# Patient Record
Sex: Female | Born: 1965 | Hispanic: Yes | Marital: Single | Smoking: Current every day smoker
Health system: Southern US, Community
[De-identification: ages and names within clinical notes are randomized; demographics above are authoritative.]

## PROBLEM LIST (undated history)

## (undated) DIAGNOSIS — M549 Dorsalgia, unspecified: Secondary | ICD-10-CM

## (undated) DIAGNOSIS — K2970 Gastritis, unspecified, without bleeding: Secondary | ICD-10-CM

---

## 2014-02-27 ENCOUNTER — Emergency Department (HOSPITAL_COMMUNITY): Payer: Self-pay

## 2014-02-27 ENCOUNTER — Emergency Department (HOSPITAL_COMMUNITY)
Admission: EM | Admit: 2014-02-27 | Discharge: 2014-02-27 | Disposition: A | Discharge status: Home / Self Care | Payer: Self-pay | Attending: Emergency Medicine | Admitting: Emergency Medicine

## 2014-02-27 ENCOUNTER — Encounter (HOSPITAL_COMMUNITY): Payer: Self-pay | Admitting: Emergency Medicine

## 2014-02-27 DIAGNOSIS — J069 Acute upper respiratory infection, unspecified: Secondary | ICD-10-CM | POA: Insufficient documentation

## 2014-02-27 DIAGNOSIS — R0602 Shortness of breath: Secondary | ICD-10-CM | POA: Insufficient documentation

## 2014-02-27 DIAGNOSIS — F17200 Nicotine dependence, unspecified, uncomplicated: Secondary | ICD-10-CM | POA: Insufficient documentation

## 2014-02-27 DIAGNOSIS — Z8719 Personal history of other diseases of the digestive system: Secondary | ICD-10-CM | POA: Insufficient documentation

## 2014-02-27 DIAGNOSIS — J3489 Other specified disorders of nose and nasal sinuses: Secondary | ICD-10-CM | POA: Insufficient documentation

## 2014-02-27 DIAGNOSIS — M549 Dorsalgia, unspecified: Secondary | ICD-10-CM | POA: Insufficient documentation

## 2014-02-27 HISTORY — DX: Gastritis, unspecified, without bleeding: K29.70

## 2014-02-27 HISTORY — DX: Dorsalgia, unspecified: M54.9

## 2014-02-27 LAB — I-STAT TROPONIN, ED: Troponin i, poc: 0.01 ng/mL (ref 0.00–0.08)

## 2014-02-27 LAB — BASIC METABOLIC PANEL
Anion gap: 15 (ref 5–15)
BUN: 15 mg/dL (ref 6–23)
CALCIUM: 9.2 mg/dL (ref 8.4–10.5)
CO2: 23 mEq/L (ref 19–32)
CREATININE: 0.79 mg/dL (ref 0.50–1.10)
Chloride: 105 mEq/L (ref 96–112)
GFR calc Af Amer: >90 mL/min (ref >90)
GFR calc non Af Amer: >90 mL/min (ref >90)
GLUCOSE: 98 mg/dL (ref 70–99)
Potassium: 3.9 mEq/L (ref 3.7–5.3)
Sodium: 143 mEq/L (ref 137–147)

## 2014-02-27 LAB — CBC WITH DIFFERENTIAL/PLATELET
BASOS ABS: 0.1 10*3/uL (ref 0.0–0.1)
Basophils Relative: 0 % (ref 0–1)
Eosinophils Absolute: 0.2 10*3/uL (ref 0.0–0.7)
Eosinophils Relative: 1 % (ref 0–5)
HCT: 36.9 % (ref 36.0–46.0)
Hemoglobin: 12.3 g/dL (ref 12.0–15.0)
LYMPHS PCT: 26 % (ref 12–46)
Lymphs Abs: 3.1 10*3/uL (ref 0.7–4.0)
MCH: 30 pg (ref 26.0–34.0)
MCHC: 33.3 g/dL (ref 30.0–36.0)
MCV: 90 fL (ref 78.0–100.0)
MONO ABS: 0.6 10*3/uL (ref 0.1–1.0)
Monocytes Relative: 5 % (ref 3–12)
Neutro Abs: 8.1 10*3/uL — ABNORMAL HIGH (ref 1.7–7.7)
Neutrophils Relative %: 68 % (ref 43–77)
Platelets: 293 10*3/uL (ref 150–400)
RBC: 4.1 MIL/uL (ref 3.87–5.11)
RDW: 13.3 % (ref 11.5–15.5)
WBC: 12 10*3/uL — AB (ref 4.0–10.5)

## 2014-02-27 MED ORDER — IOHEXOL 300 MG/ML  SOLN
80.0000 mL | Freq: Once | INTRAMUSCULAR | Status: AC | PRN
  Administered 2014-02-27: 80 mL via INTRAVENOUS

## 2014-02-27 MED ORDER — OXYCODONE-ACETAMINOPHEN 5-325 MG PO TABS
1.0000 | ORAL_TABLET | Freq: Once | ORAL | Status: AC
  Administered 2014-02-27: 1 via ORAL
  Filled 2014-02-27: qty 1

## 2014-02-27 MED ORDER — FEXOFENADINE HCL 60 MG PO TABS: 60.0000 mg | ORAL_TABLET | Freq: Two times a day (BID) | ORAL | Status: AC

## 2014-02-27 MED ORDER — ALBUTEROL SULFATE (2.5 MG/3ML) 0.083% IN NEBU
5.0000 mg | INHALATION_SOLUTION | Freq: Once | RESPIRATORY_TRACT | Status: AC
  Administered 2014-02-27: 5 mg via RESPIRATORY_TRACT
  Filled 2014-02-27: qty 6

## 2014-02-27 MED ORDER — PREDNISONE 20 MG PO TABS
60.0000 mg | ORAL_TABLET | Freq: Once | ORAL | Status: AC
  Administered 2014-02-27: 60 mg via ORAL
  Filled 2014-02-27: qty 3

## 2014-02-27 MED ORDER — ALBUTEROL SULFATE HFA 108 (90 BASE) MCG/ACT IN AERS
1.0000 | INHALATION_SPRAY | Freq: Four times a day (QID) | RESPIRATORY_TRACT | Status: DC | PRN
  Administered 2014-02-27: 1 via RESPIRATORY_TRACT
  Filled 2014-02-27: qty 6.7

## 2014-02-27 NOTE — ED Notes (Signed)
Interpreter used to explain AVS in detail. Patient knows to follow up for CT or X-Ray of chest in 3-6 months. Educated on inhaler use. No other questions/concerns. RR even/unlabored.

## 2014-02-27 NOTE — ED Notes (Signed)
Patient transported to X-ray 

## 2014-02-27 NOTE — ED Notes (Signed)
Attempting to contact interpreter to discharge patient.

## 2014-02-27 NOTE — ED Notes (Signed)
MD at bedside. 

## 2014-02-27 NOTE — Discharge Instructions (Signed)
Infeccin de las vas areas superiores en los adultos (Upper Respiratory Infection, Adult)  La infeccin respiratoria de las vas areas superiores se conoce tambin como resfro comn. Las vas areas superiores [employer] senos nasales, la garganta, la trquea, y los bronquios. Los bronquios son las vas areas que conducen el aire a los pulmones. La mayor parte de las personas mejora luego de una [place], Armed forces training and education officer los sntomas pueden durar [employer]. La tos residual puede durar ms. CAUSAS Varios tipos de virus pueden causar la infeccin de los tejidos que cubren las vas areas superiores. Los tejidos se irritan y se inflaman y se originan secreciones. Tambin es frecuente la produccin de moco. El resfro es contagioso. El virus se disemina fcilmente a otras personas por contacto oral. Aqu se incluyen los besos, el compartir un vaso y el toser o Brewing technologist. Tambin puede diseminarse tocndose la boca o la [place] y luego tocando una superficie que luego tocan Producer, television/film/video.  SNTOMAS Los sntomas se desarrollan entre uno y tres das luego de Dietitian en contacto con el virus. Pueden variar de Ardelia Mems persona a otra. Incluyen:  Secrecin nasal.  Estornudos  Congestin nasal.  Irritacin de los senos nasales.  Dolor de Investment banker, operational.  Prdida de la voz (laringitis).  Tos.  Fatiga.  Dolores musculares.  Prdida del apetito.  Dolor de [place].  Fiebre no muy elevada. DIAGNSTICO Puede diagnosticarse a s mismo la infeccin respiratoria, segn los sntomas habituales, ya que la mayor parte de las personas se resfra dos o tres veces al ao. El profesional puede confirmarlo basndose en el examen fsico. Lo ms importante es que el profesional verifique que los sntomas no se deben a otra enfermedad como anginas, sinusitis, neumona, asma o epiglotitis. Para diagnosticar el resfrio comn, no es necesario que haga anlisis de [place], pruebas en la garganta o radiografas, pero en algunos  casos puede ser de utilidad para excluir otros problemas ms graves. El mdico decidir si necesita otras pruebas. RIESGOS Y COMPLICACIONES Tendr mayor riesgo de sufrir un resfro grave si consume cigarrillos, sufre una enfermedad cardaca (como insuficiencia cardaca) o pulmonar crnica (como asma) o si tiene un debilitamiento del sistema inmunolgico. Las personas muy jvenes o muy mayores tienen riesgo de sufrir infecciones ms graves. La sinusitis bacteriana, las infecciones del odo medio y la neumona bacteriana pueden complicar el resfro comn. El resfro puede exacerbar el asma y la enfermedad pulmonar obstructiva crnica. En algunos casos estas complicaciones requieren la atencin en un servicio de emergencias y pueden poner en peligro la vida. PREVENCIN La mejor manera de protegerse para no contraer un resfro es Theatre manager una buena higiene. Evite el contacto bucal o de las manos con personas con sntomas de resfro. Si se produce el contacto, lvese las manos con frecuencia. No hay pruebas firmes que indiquen que la vitamina C, la vitamina E, la equincea o la actividad fsica reduzcan las posibilidades de tener una infeccin. Sin embargo, siempre se recomienda Scientific laboratory technician y Lucilla Edin buena nutricin. TRATAMIENTO El tratamiento est dirigido a Herbalist sntomas. Esta enfermedad no tiene [place]. Los antibiticos no son eficaces, ya que esta infeccin la causa un virus y no una bacteria. El tratamiento incluye:  Aumente la ingesta de lquidos. Consumo de bebidas deportivas, que proporcionan electrolitos,azcares e hidratacin.  Inhale vapor caliente (de un vaporizador o de la ducha).  Tomar sopa de pollo u otros lquidos claros, y [employer] buena nutricin.  Descanse lo suficiente.  Haga grgaras o coma pastillas para Public house manager  las molestias.  Control de la fiebre con ibuprofeno o acetaminofen, segn las indicaciones del mdico.  Aumento del uso del inhalador, si sufre asma. Las  pastillas y los geles de zinc durante las primeras 24 horas de iniciado el resfro comn, pueden disminuir la duracin y Paramedic la gravedad de los sntomas. Los medicamentos para Chief Technology Officer pueden disminuir la fiebre, Paramedic los dolores musculares y Chief Technology Officer de Advertising copywriter. Se dispone de una gran variedad de medicamentos de venta libre para tratar la congestin y la secrecin nasal. El profesional podr recomendarle inhalantes para los otros sntomas. INSTRUCCIONES PARA EL CUIDADO DOMICILIARIO  Utilice los medicamentos de venta libre o de prescripcin para Chief Technology Officer, el malestar o la [place], segn se lo indique el profesional que lo asiste.  Utilice un vaporizador caliente o inhale vapor, haciendo salir agua de la ducha para aumentar la humedad [place]. Esto mantendr las secreciones hmedas y Community education officer ms fcil respirar.  Beba gran cantidad de lquido para mantener la orina de tono claro o color amarillo plido.  Descanse todo lo que pueda.  Regrese a su trabajo cuando la temperatura se haya normalizado, o cuando el profesional que lo asiste se lo indique. Quizs sea necesario que permanezca en su casa durante un tiempo ms prolongado para Buyer, retail a Economist. Tambin puede utilizar un barbijo y ser cuidadoso con el lavado de manos para evitar la diseminacin del virus. SOLICITE ATENCIN MDICA SI:  Luego de los primeros das siente que empeora en vez de [place].  Necesita que Occupational psychologist brinde ms informacin relacionada con los medicamentos para [employer] sntomas.  Siente escalofros, le falta el aire o escupe moco de color marrn o rojo. Estos pueden ser sntomas de neumona.  Tiene una secrecin nasal de color amarillo o marrn, o siente dolor en el rostro, especialmente cuando se inclina hacia adelante. Estos pueden ser sntomas de sinusitis.  Tiene fiebre, siente el cuello hinchado, tiene dolor al tragar u observa manchas blancas en el fondo de la garganta.  Estos pueden ser sntomas de angina por estreptococo. Romona Curls ATENCIN MDICA DE INMEDIATO SI:  Lance Muss.  Comienza a sentir Herbalist de cabeza intenso o persistente, dolor de odos, en el seno nasal o en el pecho.  Tiene tos y esta se prolonga demasiado, tose y escupe sangre, la mucosidad habitual se modifica (si tiene una enfermedad pulmonar crnica) o respira con dificultad.  Siente rigidez en el cuello o dolor de cabeza intenso. Document Released: 03/26/2005 Document Revised: 09/08/2011 [facility] Patient Information 2015 [place], [place]. This information is not intended to replace advice given to you by your health care provider. Make sure you discuss any questions you have with your health care provider.       Resource Guide (Spanish)   Problemas Dentales  Pacientes con Medicaid    :                                                               [facility]   [employer] [phone]                          [phone] [address] [place], [place]  [place], [place]                                             Phone:  [phone]               Phone:  [phone] Si usted no puede pagar o no tiene Water engineer con: Health Serve o el departamento de salud de [place] para que le autoricen el uso de la Therapist, art para adultos.  Problemas con Dolor Crnico Pngase en contacto con la clinica de dolores cronicos en el hospital de [place], [phone].   Los pacientes deben ser [employer] por su medico de cabecera.  Si usted no tiene dinero para pagar sus medicinas Pngase en contacto con United Way:  llame al "211" o Insurance underwriter.   Phone: [phone]  Si usted no tiene mdico de [employer]      Phone: [phone]. Otras agencias que ofrecen cuidado mdico barato son:      Medicina de [place]  [phone]      [facility]  [phone]      Health Serve  Ministry  [phone]      [facility]  [phone]      Planned Parenthood  [phone]      [facility]  [phone]  [facility]  [phone] [facility]  [phone] [facility]  [phone]  (servicios de emergencia [phone])  Abuse/Neglect [facility] Child Abuse Hotline [phone] [facility] Child Abuse Hotline 5856757630 (After Hours)  Emergency Shelter [facility] [phone]  Maternity Homes [facility] at the [facility] [phone] [facility] [phone]  MRSA Hotline #:   (954)725-7771  Servicios de [facility]    United Way           [facility] [address]                                    [address]       [address]                                         Cristobal Goldmann Phone:  [phone]                            Phone: [phone]                Phone: [phone]  [facility] Phone:  [phone]  [facility] Child Abuse Hotline [phone] (After Hours)

## 2014-02-27 NOTE — ED Provider Notes (Signed)
CSN: [id number]     Arrival date & time 02/27/14  1017 History   First MD Initiated Contact with Patient 02/27/14 1103     Chief Complaint  Patient presents with  . Back Pain  . Nasal Congestion     (Consider location/radiation/quality/duration/timing/severity/associated sxs/prior Treatment) HPI Comments: History is obtained through [place] interpreter. She presents with shortness of breath. She states she's had episodes of shortness of breath intermittently in the past. They normally resolve on their and after a few days. She states she's had some shortness of breath over the last 2-3 days. She denies any chest pain. There's no pain on deep breathing. She's had some nasal congestion but no cough. She does feel congested in her chest. She has no leg pain or swelling. She's had no known prior history of lung problems that she knows that. She is a current everyday smoker. She also has some intermittent ongoing back pain which is unchanged from her baseline. She denies any pain that radiates down her legs. She denies any numbness or weakness to her legs.  Patient is a 48 y.o. female presenting with back pain.  Back Pain Associated symptoms: no abdominal pain, no chest pain, no fever, no headaches, no numbness and no weakness     Past Medical History  Diagnosis Date  . Back pain   . Gastritis    Past Surgical History  Procedure Laterality Date  . Cesarean section     No family history on file. History  Substance Use Topics  . Smoking status: Current Every Day Smoker  . Smokeless tobacco: Not on file  . Alcohol Use: Yes   OB History   Grav Para Term Preterm Abortions TAB SAB Ect Mult Living                 Review of Systems  Constitutional: Negative for fever, chills, diaphoresis and fatigue.  HENT: Positive for congestion, postnasal drip and rhinorrhea. Negative for sneezing.   Eyes: Negative.   Respiratory: Positive for shortness of breath. Negative for cough and chest  tightness.   Cardiovascular: Negative for chest pain and leg swelling.  Gastrointestinal: Negative for nausea, vomiting, abdominal pain, diarrhea and blood in stool.  Genitourinary: Negative for frequency, hematuria, flank pain and difficulty urinating.  Musculoskeletal: Positive for back pain. Negative for arthralgias.  Skin: Negative for rash.  Neurological: Negative for dizziness, speech difficulty, weakness, numbness and headaches.      Allergies  Review of patient's allergies indicates no known allergies.  Home Medications   Prior to Admission medications   Medication Sig Start Date End Date Taking? Authorizing Provider  fexofenadine (ALLEGRA) 60 MG tablet Take 1 tablet (60 mg total) by mouth 2 (two) times daily. 02/27/14   Rolan Bucco, MD   BP 119/75  Pulse 85  Temp(Src) 97.9 F (36.6 C) (Oral)  Resp 20  SpO2 94% Physical Exam  Constitutional: She is oriented to person, place, and time. She appears well-developed and well-nourished.  HENT:  Head: Normocephalic and atraumatic.  Eyes: Pupils are equal, round, and reactive to light.  Neck: Normal range of motion. Neck supple.  Cardiovascular: Normal rate, regular rhythm and normal heart sounds.   Pulmonary/Chest: Effort normal. No respiratory distress. She has no wheezes. She exhibits no tenderness.  Few crackles in the bases bilaterally  Abdominal: Soft. Bowel sounds are normal. There is no tenderness. There is no rebound and no guarding.  Musculoskeletal: Normal range of motion. She exhibits no edema.  Lymphadenopathy:  She has no cervical adenopathy.  Neurological: She is alert and oriented to person, place, and time.  Skin: Skin is warm and dry. No rash noted.  Psychiatric: She has a normal mood and affect.    ED Course  Procedures (including critical care time) Labs Review Results for orders placed during the hospital encounter of 02/27/14  CBC WITH DIFFERENTIAL      Result Value Ref Range   WBC 12.0 (*)  4.0 - 10.5 K/uL   RBC 4.10  3.87 - 5.11 MIL/uL   Hemoglobin 12.3  12.0 - 15.0 g/dL   HCT 16.1  09.6 - 04.5 %   MCV 90.0  78.0 - 100.0 fL   MCH 30.0  26.0 - 34.0 pg   MCHC 33.3  30.0 - 36.0 g/dL   RDW 40.9  81.1 - 91.4 %   Platelets 293  150 - 400 K/uL   Neutrophils Relative % 68  43 - 77 %   Neutro Abs 8.1 (*) 1.7 - 7.7 K/uL   Lymphocytes Relative 26  12 - 46 %   Lymphs Abs 3.1  0.7 - 4.0 K/uL   Monocytes Relative 5  3 - 12 %   Monocytes Absolute 0.6  0.1 - 1.0 K/uL   Eosinophils Relative 1  0 - 5 %   Eosinophils Absolute 0.2  0.0 - 0.7 K/uL   Basophils Relative 0  0 - 1 %   Basophils Absolute 0.1  0.0 - 0.1 K/uL  BASIC METABOLIC PANEL      Result Value Ref Range   Sodium 143  137 - 147 mEq/L   Potassium 3.9  3.7 - 5.3 mEq/L   Chloride 105  96 - 112 mEq/L   CO2 23  19 - 32 mEq/L   Glucose, Bld 98  70 - 99 mg/dL   BUN 15  6 - 23 mg/dL   Creatinine, Ser 7.82  0.50 - 1.10 mg/dL   Calcium 9.2  8.4 - 95.6 mg/dL   GFR calc non Af Amer >90  >90 mL/min   GFR calc Af Amer >90  >90 mL/min   Anion gap 15  5 - 15  I-STAT TROPOININ, ED      Result Value Ref Range   Troponin i, poc 0.01  0.00 - 0.08 ng/mL   Comment 3            Dg Chest 2 View  02/27/2014   CLINICAL DATA:  Mid back pain  EXAM: CHEST  2 VIEW  COMPARISON:  None.  FINDINGS: The lungs are markedly abnormal bilaterally. Within the right lung, there is volume loss. Heterogeneous opacities are scattered throughout the lung with relative lucency in the upper lobe. This may represent cavitation. There is pleural thickening at the right apex. There is more confluent opacity in the posterior upper lobe, likely the right. Underlying mass at the right apex is not excluded.  Date heterogeneous opacities are present at the left base. There is irregular opacity extending into the left apex as well. These densities are predominantly linear.  IMPRESSION: Abnormal lungs as described. An inflammatory process or malignancy are not excluded.  Comparison with prior studies would be helpful. If none are available, CT chest is recommended to exclude mass.   Electronically Signed   By: Maryclare Bean M.D.   On: 02/27/2014 12:08   Ct Chest W Contrast  02/27/2014   CLINICAL DATA:  Shortness of breath. Chest and back pain. Right apical opacity on chest radiograph.  EXAM: CT CHEST WITH CONTRAST  TECHNIQUE: Multidetector CT imaging of the chest was performed during intravenous contrast administration.  CONTRAST:  80mL OMNIPAQUE IOHEXOL 300 MG/ML  SOLN  COMPARISON:  None.  FINDINGS: Mediastinum/Hilar Regions: Mild soft tissue density seen in the right paratracheal region measuring 1.3 cm on image 13. Mild subcarinal lymphadenopathy also seen measuring 1.3 cm. No evidence of lymph node calcification.  Other Thoracic Lymphadenopathy:  None.  Lungs: Asymmetric pleural-parenchymal opacity in the right lung apex shows associated peripheral bronchiectasis, consistent with scarring. Other areas of scarring are also seen bilaterally involving the right lung greater than left.  Diffuse thickening of pulmonary interlobular septi also demonstrated with bibasilar predominance, and mild interstitial edema cannot be excluded. No evidence of pulmonary consolidation or discrete pulmonary mass. No evidence of central endobronchial obstruction.  Pleura: Tiny bilateral pleural effusions noted, left side greater than right.  Vascular/Cardiac: No thoracic aortic aneurysm or other significant abnormality identified.  Musculoskeletal:  No suspicious bone lesions identified.  Other:  None.  IMPRESSION: Tiny bilateral pleural effusions and thickening of pulmonary interlobular septi in both lung bases. Although this could be due to chronic interstitial lung disease, mild edema or interstitial pneumonitis cannot be excluded. Continued chest radiographic followup is recommended.  Bilateral pleural- parenchymal scarring with associated traction bronchiectasis in right lung apex.  Mild  mediastinal lymphadenopathy in the right paratracheal and subcarinal regions, which is nonspecific and may be reactive in etiology. Continued followup by CT is recommended in 3-6 months.   Electronically Signed   By: Myles Rosenthal M.D.   On: 02/27/2014 14:31     Imaging Review Dg Chest 2 View  02/27/2014   CLINICAL DATA:  Mid back pain  EXAM: CHEST  2 VIEW  COMPARISON:  None.  FINDINGS: The lungs are markedly abnormal bilaterally. Within the right lung, there is volume loss. Heterogeneous opacities are scattered throughout the lung with relative lucency in the upper lobe. This may represent cavitation. There is pleural thickening at the right apex. There is more confluent opacity in the posterior upper lobe, likely the right. Underlying mass at the right apex is not excluded.  Date heterogeneous opacities are present at the left base. There is irregular opacity extending into the left apex as well. These densities are predominantly linear.  IMPRESSION: Abnormal lungs as described. An inflammatory process or malignancy are not excluded. Comparison with prior studies would be helpful. If none are available, CT chest is recommended to exclude mass.   Electronically Signed   By: Maryclare Bean M.D.   On: 02/27/2014 12:08   Ct Chest W Contrast  02/27/2014   CLINICAL DATA:  Shortness of breath. Chest and back pain. Right apical opacity on chest radiograph.  EXAM: CT CHEST WITH CONTRAST  TECHNIQUE: Multidetector CT imaging of the chest was performed during intravenous contrast administration.  CONTRAST:  80mL OMNIPAQUE IOHEXOL 300 MG/ML  SOLN  COMPARISON:  None.  FINDINGS: Mediastinum/Hilar Regions: Mild soft tissue density seen in the right paratracheal region measuring 1.3 cm on image 13. Mild subcarinal lymphadenopathy also seen measuring 1.3 cm. No evidence of lymph node calcification.  Other Thoracic Lymphadenopathy:  None.  Lungs: Asymmetric pleural-parenchymal opacity in the right lung apex shows associated  peripheral bronchiectasis, consistent with scarring. Other areas of scarring are also seen bilaterally involving the right lung greater than left.  Diffuse thickening of pulmonary interlobular septi also demonstrated with bibasilar predominance, and mild interstitial edema cannot be excluded. No evidence of pulmonary consolidation or discrete  pulmonary mass. No evidence of central endobronchial obstruction.  Pleura: Tiny bilateral pleural effusions noted, left side greater than right.  Vascular/Cardiac: No thoracic aortic aneurysm or other significant abnormality identified.  Musculoskeletal:  No suspicious bone lesions identified.  Other:  None.  IMPRESSION: Tiny bilateral pleural effusions and thickening of pulmonary interlobular septi in both lung bases. Although this could be due to chronic interstitial lung disease, mild edema or interstitial pneumonitis cannot be excluded. Continued chest radiographic followup is recommended.  Bilateral pleural- parenchymal scarring with associated traction bronchiectasis in right lung apex.  Mild mediastinal lymphadenopathy in the right paratracheal and subcarinal regions, which is nonspecific and may be reactive in etiology. Continued followup by CT is recommended in 3-6 months.   Electronically Signed   By: Myles Rosenthal M.D.   On: 02/27/2014 14:31     EKG Interpretation   Date/Time:  Monday February 27 2014 11:36:01 EDT Ventricular Rate:  96 PR Interval:  166 QRS Duration: 76 QT Interval:  387 QTC Calculation: 489 R Axis:   84 Text Interpretation:  Sinus rhythm Biatrial enlargement Probable left  ventricular hypertrophy Borderline prolonged QT interval No old tracing to  compare Confirmed by CAMPOS  MD, Caryn Bee ([id number]) on 02/27/2014 12:10:57 PM      MDM   Final diagnoses:  URI (upper respiratory infection)    Patient presents with nasal congestion and shortness of breath. She has no ongoing coughing. She has findings on her chest x-ray that were  consistent with some chronic underlying lung disease. Chest CT was done which showed some likely chronic findings as well. There's possible mild edema. She has a few crackles in the bases and some mild rhonchi. She has no increased work of breathing. She has no other symptoms that would be more consistent with CHF. She has no fevers or other suggestions of pneumonia. She felt better after nebulizer treatment. I will dispense her an albuterol MDI to use. I also advised her through the Spanish interpreter that she had some enlarged lymph nodes and some abnormal lung findings that need followup by primary care physician. Advise her that she needs a repeat CT scan in about 3-6 months.    Rolan Bucco, MD 02/27/14 [phone]

## 2014-02-27 NOTE — Progress Notes (Signed)
[facility] [employer],  Provided pt with a list of primary care resources. Patient lives in [place].

## 2014-02-27 NOTE — ED Provider Notes (Signed)
MSE was initiated and I personally evaluated the patient and placed orders (if any) at  11:22 AM on February 27, 2014.  The patient appears stable so that the remainder of the MSE may be completed by another provider.  Patient brought to fast track with complaint of back pain that is chronic.   History obtained through interpreter at bedside. Patient has chronic back pain and this is worse than usual, however her main complaint seems to be shortness of breath. She has had to 3 episodes of shortness of breath in the past that has not been evaluated by a doctor. Her shortness of breath started at 8 AM today and is consistent. She denies any chest pain, cough, pleuritic back pain. No history of blood clots. No treatments prior.   Exam:  Gen NAD; Heart mild tachycardia, nml S1,S2, no m/r/g; Lungs CTAB; Abd soft, NT, no rebound or guarding; Ext 2+ pedal pulses bilaterally, no edema.  BP 127/78  Pulse 101  Temp(Src) 98.4 F (36.9 C) (Oral)  Resp 20  SpO2 97%   Will move to acute room for remainder of work-up.     Renne Crigler, PA-C 02/27/14 1125

## 2014-02-27 NOTE — ED Notes (Signed)
Per pt/family, back pain for 4 days-nasal congestion as well

## 2014-02-27 NOTE — ED Provider Notes (Signed)
Medical screening examination/treatment/procedure(s) were performed by non-physician practitioner and as supervising physician I was immediately available for consultation/collaboration.   EKG Interpretation None        Lyanne Co, MD 02/27/14 1210

## 2014-02-27 NOTE — ED Notes (Signed)
Per ems pt with chronic back pain that increased this am and usually taking percoet for pain. She reports increase pain x4 days with increase this am. Pt ambulatory.

## 2014-03-02 ENCOUNTER — Inpatient Hospital Stay (HOSPITAL_COMMUNITY)
Admission: EM | Admit: 2014-03-02 | Discharge: 2014-03-19 | DRG: 207 | Disposition: A | Discharge status: Home / Self Care | Payer: Self-pay | Attending: Internal Medicine | Admitting: Internal Medicine

## 2014-03-02 ENCOUNTER — Emergency Department (HOSPITAL_COMMUNITY): Payer: MEDICAID

## 2014-03-02 ENCOUNTER — Inpatient Hospital Stay (HOSPITAL_COMMUNITY): Payer: MEDICAID

## 2014-03-02 ENCOUNTER — Emergency Department (HOSPITAL_COMMUNITY): Payer: Self-pay

## 2014-03-02 ENCOUNTER — Encounter (HOSPITAL_COMMUNITY): Payer: Self-pay | Admitting: Emergency Medicine

## 2014-03-02 DIAGNOSIS — R6520 Severe sepsis without septic shock: Secondary | ICD-10-CM

## 2014-03-02 DIAGNOSIS — E876 Hypokalemia: Secondary | ICD-10-CM | POA: Diagnosis not present

## 2014-03-02 DIAGNOSIS — J9601 Acute respiratory failure with hypoxia: Secondary | ICD-10-CM

## 2014-03-02 DIAGNOSIS — F17200 Nicotine dependence, unspecified, uncomplicated: Secondary | ICD-10-CM | POA: Diagnosis present

## 2014-03-02 DIAGNOSIS — A419 Sepsis, unspecified organism: Secondary | ICD-10-CM | POA: Diagnosis present

## 2014-03-02 DIAGNOSIS — IMO0002 Reserved for concepts with insufficient information to code with codable children: Secondary | ICD-10-CM | POA: Diagnosis present

## 2014-03-02 DIAGNOSIS — E872 Acidosis, unspecified: Secondary | ICD-10-CM

## 2014-03-02 DIAGNOSIS — J90 Pleural effusion, not elsewhere classified: Secondary | ICD-10-CM | POA: Diagnosis present

## 2014-03-02 DIAGNOSIS — I5041 Acute combined systolic (congestive) and diastolic (congestive) heart failure: Secondary | ICD-10-CM | POA: Diagnosis present

## 2014-03-02 DIAGNOSIS — Z2989 Encounter for other specified prophylactic measures: Secondary | ICD-10-CM

## 2014-03-02 DIAGNOSIS — K567 Ileus, unspecified: Secondary | ICD-10-CM

## 2014-03-02 DIAGNOSIS — J9602 Acute respiratory failure with hypercapnia: Secondary | ICD-10-CM

## 2014-03-02 DIAGNOSIS — I509 Heart failure, unspecified: Secondary | ICD-10-CM | POA: Diagnosis present

## 2014-03-02 DIAGNOSIS — I428 Other cardiomyopathies: Secondary | ICD-10-CM

## 2014-03-02 DIAGNOSIS — J9819 Other pulmonary collapse: Secondary | ICD-10-CM | POA: Diagnosis present

## 2014-03-02 DIAGNOSIS — D638 Anemia in other chronic diseases classified elsewhere: Secondary | ICD-10-CM | POA: Diagnosis present

## 2014-03-02 DIAGNOSIS — Z23 Encounter for immunization: Secondary | ICD-10-CM

## 2014-03-02 DIAGNOSIS — J80 Acute respiratory distress syndrome: Secondary | ICD-10-CM

## 2014-03-02 DIAGNOSIS — R092 Respiratory arrest: Secondary | ICD-10-CM

## 2014-03-02 DIAGNOSIS — J9600 Acute respiratory failure, unspecified whether with hypoxia or hypercapnia: Secondary | ICD-10-CM | POA: Diagnosis present

## 2014-03-02 DIAGNOSIS — R6521 Severe sepsis with septic shock: Secondary | ICD-10-CM

## 2014-03-02 DIAGNOSIS — Z418 Encounter for other procedures for purposes other than remedying health state: Secondary | ICD-10-CM

## 2014-03-02 DIAGNOSIS — J189 Pneumonia, unspecified organism: Principal | ICD-10-CM

## 2014-03-02 DIAGNOSIS — N179 Acute kidney failure, unspecified: Secondary | ICD-10-CM | POA: Diagnosis present

## 2014-03-02 DIAGNOSIS — I469 Cardiac arrest, cause unspecified: Secondary | ICD-10-CM | POA: Diagnosis present

## 2014-03-02 DIAGNOSIS — I2589 Other forms of chronic ischemic heart disease: Secondary | ICD-10-CM | POA: Diagnosis present

## 2014-03-02 DIAGNOSIS — K560 Paralytic ileus: Secondary | ICD-10-CM | POA: Diagnosis present

## 2014-03-02 DIAGNOSIS — F411 Generalized anxiety disorder: Secondary | ICD-10-CM | POA: Diagnosis present

## 2014-03-02 DIAGNOSIS — I5021 Acute systolic (congestive) heart failure: Secondary | ICD-10-CM

## 2014-03-02 DIAGNOSIS — G9340 Encephalopathy, unspecified: Secondary | ICD-10-CM

## 2014-03-02 DIAGNOSIS — R404 Transient alteration of awareness: Secondary | ICD-10-CM | POA: Diagnosis not present

## 2014-03-02 DIAGNOSIS — J9690 Respiratory failure, unspecified, unspecified whether with hypoxia or hypercapnia: Secondary | ICD-10-CM | POA: Diagnosis present

## 2014-03-02 DIAGNOSIS — Z79899 Other long term (current) drug therapy: Secondary | ICD-10-CM

## 2014-03-02 DIAGNOSIS — I255 Ischemic cardiomyopathy: Secondary | ICD-10-CM

## 2014-03-02 DIAGNOSIS — E8729 Other acidosis: Secondary | ICD-10-CM

## 2014-03-02 LAB — BLOOD GAS, ARTERIAL
ACID-BASE DEFICIT: 3.6 mmol/L — AB (ref 0.0–2.0)
Acid-base deficit: 19 mmol/L — ABNORMAL HIGH (ref 0.0–2.0)
Acid-base deficit: 2.4 mmol/L — ABNORMAL HIGH (ref 0.0–2.0)
BICARBONATE: 23.8 meq/L (ref 20.0–24.0)
BICARBONATE: 23.2 meq/L (ref 20.0–24.0)
Bicarbonate: 17.3 mEq/L — ABNORMAL LOW (ref 20.0–24.0)
DRAWN BY: 331471
Drawn by: 257701
FIO2: 1 %
FIO2: 1 %
FIO2: 0.4 %
LHR: 24 {breaths}/min
MECHVT: 460 mL
MECHVT: 460 mL
O2 SAT: 90.4 %
O2 Saturation: 92.5 %
O2 Saturation: 98.2 %
PCO2 ART: 99.1 mmHg — AB (ref 35.0–45.0)
PCO2 ART: 58.1 mmHg — AB (ref 35.0–45.0)
PCO2 ART: 46.5 mmHg — AB (ref 35.0–45.0)
PEEP/CPAP: 5 cmH2O
PEEP: 5 cmH2O
PH ART: 6.873 — AB (ref 7.350–7.450)
PH ART: 7.237 — AB (ref 7.350–7.450)
PO2 ART: 116 mmHg — AB (ref 80.0–100.0)
PO2 ART: 142 mmHg — AB (ref 80.0–100.0)
Patient temperature: 98.6
Patient temperature: 98.6
Patient temperature: 37
RATE: 16 resp/min
TCO2: 18.4 mmol/L (ref 0–100)
TCO2: 22.8 mmol/L (ref 0–100)
TCO2: 21.9 mmol/L (ref 0–100)
pH, Arterial: 7.319 — ABNORMAL LOW (ref 7.350–7.450)
pO2, Arterial: 63.1 mmHg — ABNORMAL LOW (ref 80.0–100.0)

## 2014-03-02 LAB — CBC WITH DIFFERENTIAL/PLATELET
BASOS PCT: 0 % (ref 0–1)
Basophils Absolute: 0 10*3/uL (ref 0.0–0.1)
EOS ABS: 0.2 10*3/uL (ref 0.0–0.7)
EOS PCT: 1 % (ref 0–5)
HCT: 39.5 % (ref 36.0–46.0)
HEMOGLOBIN: 12.3 g/dL (ref 12.0–15.0)
LYMPHS PCT: 35 % (ref 12–46)
Lymphs Abs: 6.4 10*3/uL — ABNORMAL HIGH (ref 0.7–4.0)
MCH: 29.9 pg (ref 26.0–34.0)
MCHC: 31.1 g/dL (ref 30.0–36.0)
MCV: 96.1 fL (ref 78.0–100.0)
Monocytes Absolute: 1.5 10*3/uL — ABNORMAL HIGH (ref 0.1–1.0)
Monocytes Relative: 8 % (ref 3–12)
NEUTROS PCT: 56 % (ref 43–77)
Neutro Abs: 10.1 10*3/uL — ABNORMAL HIGH (ref 1.7–7.7)
Platelets: 313 10*3/uL (ref 150–400)
RBC: 4.11 MIL/uL (ref 3.87–5.11)
RDW: 13.7 % (ref 11.5–15.5)
WBC: 18.2 10*3/uL — ABNORMAL HIGH (ref 4.0–10.5)

## 2014-03-02 LAB — RAPID URINE DRUG SCREEN, HOSP PERFORMED
AMPHETAMINES: NOT DETECTED
BARBITURATES: NOT DETECTED
Benzodiazepines: NOT DETECTED
COCAINE: NOT DETECTED
Opiates: POSITIVE — AB
TETRAHYDROCANNABINOL: NOT DETECTED

## 2014-03-02 LAB — GLUCOSE, CAPILLARY
GLUCOSE-CAPILLARY: 119 mg/dL — AB (ref 70–99)
Glucose-Capillary: 109 mg/dL — ABNORMAL HIGH (ref 70–99)

## 2014-03-02 LAB — URINE MICROSCOPIC-ADD ON

## 2014-03-02 LAB — BASIC METABOLIC PANEL
ANION GAP: 25 — AB (ref 5–15)
BUN: 20 mg/dL (ref 6–23)
CALCIUM: 9.4 mg/dL (ref 8.4–10.5)
CHLORIDE: 103 meq/L (ref 96–112)
CO2: 15 mEq/L — ABNORMAL LOW (ref 19–32)
Creatinine, Ser: 1.24 mg/dL — ABNORMAL HIGH (ref 0.50–1.10)
GFR calc Af Amer: 59 mL/min — ABNORMAL LOW (ref >90)
GFR calc non Af Amer: 51 mL/min — ABNORMAL LOW (ref >90)
Glucose, Bld: 300 mg/dL — ABNORMAL HIGH (ref 70–99)
Potassium: 4.3 mEq/L (ref 3.7–5.3)
SODIUM: 143 meq/L (ref 137–147)

## 2014-03-02 LAB — PROTIME-INR
INR: 1.08 (ref 0.00–1.49)
Prothrombin Time: 14 seconds (ref 11.6–15.2)

## 2014-03-02 LAB — I-STAT CG4 LACTIC ACID, ED: Lactic Acid, Venous: 10.62 mmol/L — ABNORMAL HIGH (ref 0.5–2.2)

## 2014-03-02 LAB — URINALYSIS, ROUTINE W REFLEX MICROSCOPIC
Bilirubin Urine: NEGATIVE
Glucose, UA: 500 mg/dL — AB
Ketones, ur: NEGATIVE mg/dL
LEUKOCYTES UA: NEGATIVE
NITRITE: NEGATIVE
PH: 6.5 (ref 5.0–8.0)
Protein, ur: 30 mg/dL — AB
SPECIFIC GRAVITY, URINE: 1.012 (ref 1.005–1.030)
Urobilinogen, UA: 1 mg/dL (ref 0.0–1.0)

## 2014-03-02 LAB — LACTIC ACID, PLASMA: Lactic Acid, Venous: 2.3 mmol/L — ABNORMAL HIGH (ref 0.5–2.2)

## 2014-03-02 LAB — MRSA PCR SCREENING: MRSA BY PCR: NEGATIVE

## 2014-03-02 LAB — PHOSPHORUS: PHOSPHORUS: 4.4 mg/dL (ref 2.3–4.6)

## 2014-03-02 LAB — APTT: aPTT: 24 seconds (ref 24–37)

## 2014-03-02 LAB — PRO B NATRIURETIC PEPTIDE: PRO B NATRI PEPTIDE: 1799 pg/mL — AB (ref 0–125)

## 2014-03-02 LAB — PROCALCITONIN: Procalcitonin: 0.17 ng/mL

## 2014-03-02 LAB — I-STAT TROPONIN, ED: Troponin i, poc: 0.02 ng/mL (ref 0.00–0.08)

## 2014-03-02 LAB — TRIGLYCERIDES: Triglycerides: 104 mg/dL (ref <150)

## 2014-03-02 LAB — STREP PNEUMONIAE URINARY ANTIGEN: Strep Pneumo Urinary Antigen: NEGATIVE

## 2014-03-02 LAB — CBG MONITORING, ED: Glucose-Capillary: 256 mg/dL — ABNORMAL HIGH (ref 70–99)

## 2014-03-02 MED ORDER — LORAZEPAM 2 MG/ML IJ SOLN
INTRAMUSCULAR | Status: AC
  Filled 2014-03-02: qty 1

## 2014-03-02 MED ORDER — IOHEXOL 350 MG/ML SOLN
100.0000 mL | Freq: Once | INTRAVENOUS | Status: AC | PRN
  Administered 2014-03-02: 100 mL via INTRAVENOUS

## 2014-03-02 MED ORDER — LORAZEPAM 2 MG/ML IJ SOLN
4.0000 mg | Freq: Once | INTRAMUSCULAR | Status: AC
  Administered 2014-03-02: 4 mg via INTRAVENOUS
  Filled 2014-03-02: qty 2

## 2014-03-02 MED ORDER — PIPERACILLIN-TAZOBACTAM 3.375 G IVPB: 3.3750 g | Freq: Three times a day (TID) | INTRAVENOUS | Status: DC

## 2014-03-02 MED ORDER — SODIUM CHLORIDE 0.9 % IV SOLN
INTRAVENOUS | Status: DC
  Administered 2014-03-02: 50 mL/h via INTRAVENOUS
  Administered 2014-03-03 – 2014-03-13 (×4): via INTRAVENOUS

## 2014-03-02 MED ORDER — PROPOFOL 10 MG/ML IV EMUL
INTRAVENOUS | Status: AC
  Filled 2014-03-02: qty 100

## 2014-03-02 MED ORDER — PANTOPRAZOLE SODIUM 40 MG IV SOLR
40.0000 mg | Freq: Every day | INTRAVENOUS | Status: DC
  Administered 2014-03-02 – 2014-03-07 (×6): 40 mg via INTRAVENOUS
  Filled 2014-03-02 (×6): qty 40

## 2014-03-02 MED ORDER — IPRATROPIUM-ALBUTEROL 0.5-2.5 (3) MG/3ML IN SOLN
3.0000 mL | RESPIRATORY_TRACT | Status: DC
  Administered 2014-03-02 – 2014-03-15 (×75): 3 mL via RESPIRATORY_TRACT
  Filled 2014-03-02 (×77): qty 3

## 2014-03-02 MED ORDER — SODIUM BICARBONATE 8.4 % IV SOLN
INTRAVENOUS | Status: AC | PRN
  Administered 2014-03-02 (×2): 50 meq via INTRAVENOUS

## 2014-03-02 MED ORDER — MORPHINE SULFATE 4 MG/ML IJ SOLN
4.0000 mg | Freq: Once | INTRAMUSCULAR | Status: AC
  Administered 2014-03-02: 4 mg via INTRAVENOUS
  Filled 2014-03-02: qty 1

## 2014-03-02 MED ORDER — VANCOMYCIN HCL IN DEXTROSE 750-5 MG/150ML-% IV SOLN
750.0000 mg | Freq: Two times a day (BID) | INTRAVENOUS | Status: DC
  Administered 2014-03-03: 750 mg via INTRAVENOUS
  Filled 2014-03-02 (×2): qty 150

## 2014-03-02 MED ORDER — VANCOMYCIN HCL IN DEXTROSE 1-5 GM/200ML-% IV SOLN
1000.0000 mg | INTRAVENOUS | Status: DC
Start: 2014-03-02 — End: 2014-03-02

## 2014-03-02 MED ORDER — CHLORHEXIDINE GLUCONATE 0.12 % MT SOLN
15.0000 mL | Freq: Two times a day (BID) | OROMUCOSAL | Status: DC
  Administered 2014-03-02 – 2014-03-19 (×32): 15 mL via OROMUCOSAL
  Filled 2014-03-02 (×35): qty 15

## 2014-03-02 MED ORDER — IPRATROPIUM BROMIDE 0.02 % IN SOLN
RESPIRATORY_TRACT | Status: AC
  Filled 2014-03-02: qty 2.5

## 2014-03-02 MED ORDER — PIPERACILLIN-TAZOBACTAM 3.375 G IVPB
3.3750 g | Freq: Three times a day (TID) | INTRAVENOUS | Status: DC
  Administered 2014-03-02 – 2014-03-09 (×21): 3.375 g via INTRAVENOUS
  Filled 2014-03-02 (×20): qty 50

## 2014-03-02 MED ORDER — LACTATED RINGERS IV BOLUS (SEPSIS)
1000.0000 mL | Freq: Once | INTRAVENOUS | Status: AC
  Administered 2014-03-02: 1000 mL via INTRAVENOUS

## 2014-03-02 MED ORDER — ETOMIDATE 2 MG/ML IV SOLN
INTRAVENOUS | Status: AC
  Filled 2014-03-02: qty 20

## 2014-03-02 MED ORDER — VANCOMYCIN HCL IN DEXTROSE 1-5 GM/200ML-% IV SOLN
1000.0000 mg | Freq: Once | INTRAVENOUS | Status: AC
Start: 2014-03-02 — End: 2014-03-02
  Administered 2014-03-02: 1000 mg via INTRAVENOUS
  Filled 2014-03-02: qty 200

## 2014-03-02 MED ORDER — PROPOFOL 10 MG/ML IV EMUL
5.0000 ug/kg/min | INTRAVENOUS | Status: DC
  Administered 2014-03-02 – 2014-03-03 (×6): 50 ug/kg/min via INTRAVENOUS
  Administered 2014-03-04: 40 ug/kg/min via INTRAVENOUS
  Administered 2014-03-04 (×4): 50 ug/kg/min via INTRAVENOUS
  Administered 2014-03-05: 40 ug/kg/min via INTRAVENOUS
  Administered 2014-03-05: 50 ug/kg/min via INTRAVENOUS
  Administered 2014-03-05: 10 ug/kg/min via INTRAVENOUS
  Filled 2014-03-02 (×16): qty 100

## 2014-03-02 MED ORDER — IPRATROPIUM BROMIDE 0.02 % IN SOLN
0.5000 mg | RESPIRATORY_TRACT | Status: DC
  Administered 2014-03-02 (×2): 0.5 mg via RESPIRATORY_TRACT
  Filled 2014-03-02: qty 2.5

## 2014-03-02 MED ORDER — ALBUTEROL SULFATE (2.5 MG/3ML) 0.083% IN NEBU
2.5000 mg | INHALATION_SOLUTION | RESPIRATORY_TRACT | Status: DC
  Administered 2014-03-02 (×2): 2.5 mg via RESPIRATORY_TRACT
  Filled 2014-03-02: qty 3

## 2014-03-02 MED ORDER — VANCOMYCIN HCL IN DEXTROSE 750-5 MG/150ML-% IV SOLN: 750.0000 mg | Freq: Two times a day (BID) | INTRAVENOUS | Status: DC

## 2014-03-02 MED ORDER — INSULIN ASPART 100 UNIT/ML ~~LOC~~ SOLN
0.0000 [IU] | SUBCUTANEOUS | Status: DC
  Administered 2014-03-04 – 2014-03-07 (×4): 2 [IU] via SUBCUTANEOUS
  Administered 2014-03-08 (×2): 3 [IU] via SUBCUTANEOUS
  Administered 2014-03-08 – 2014-03-09 (×8): 2 [IU] via SUBCUTANEOUS
  Administered 2014-03-09: 3 [IU] via SUBCUTANEOUS
  Administered 2014-03-10 (×3): 2 [IU] via SUBCUTANEOUS
  Administered 2014-03-10: 3 [IU] via SUBCUTANEOUS
  Administered 2014-03-10: 2 [IU] via SUBCUTANEOUS
  Administered 2014-03-11 (×2): 3 [IU] via SUBCUTANEOUS
  Administered 2014-03-11 (×2): 2 [IU] via SUBCUTANEOUS

## 2014-03-02 MED ORDER — PROPOFOL 10 MG/ML IV EMUL: 5.0000 ug/kg/min | Freq: Once | INTRAVENOUS | Status: DC

## 2014-03-02 MED ORDER — PROPOFOL 10 MG/ML IV EMUL
5.0000 ug/kg/min | Freq: Once | INTRAVENOUS | Status: AC
  Administered 2014-03-02: 10 ug/kg/min via INTRAVENOUS

## 2014-03-02 MED ORDER — CETYLPYRIDINIUM CHLORIDE 0.05 % MT LIQD
7.0000 mL | Freq: Four times a day (QID) | OROMUCOSAL | Status: DC
  Administered 2014-03-03 – 2014-03-18 (×54): 7 mL via OROMUCOSAL

## 2014-03-02 MED ORDER — SUCCINYLCHOLINE CHLORIDE 20 MG/ML IJ SOLN
INTRAMUSCULAR | Status: AC
  Filled 2014-03-02: qty 1

## 2014-03-02 MED ORDER — CHLORHEXIDINE GLUCONATE 0.12 % MT SOLN
OROMUCOSAL | Status: AC
  Administered 2014-03-02: 15 mL via OROMUCOSAL
  Filled 2014-03-02: qty 15

## 2014-03-02 MED ORDER — HEPARIN SODIUM (PORCINE) 5000 UNIT/ML IJ SOLN
5000.0000 [IU] | Freq: Three times a day (TID) | INTRAMUSCULAR | Status: DC
  Administered 2014-03-02 – 2014-03-19 (×49): 5000 [IU] via SUBCUTANEOUS
  Filled 2014-03-02 (×55): qty 1

## 2014-03-02 MED ORDER — LORAZEPAM 2 MG/ML IJ SOLN
0.5000 mg | Freq: Once | INTRAMUSCULAR | Status: AC
  Administered 2014-03-02: 0.5 mg via INTRAVENOUS

## 2014-03-02 MED ORDER — VANCOMYCIN HCL IN DEXTROSE 750-5 MG/150ML-% IV SOLN
750.0000 mg | Freq: Two times a day (BID) | INTRAVENOUS | Status: DC
  Filled 2014-03-02: qty 150

## 2014-03-02 MED ORDER — EPINEPHRINE HCL 0.1 MG/ML IJ SOSY
PREFILLED_SYRINGE | INTRAMUSCULAR | Status: AC | PRN
  Administered 2014-03-02: 1 via INTRAVENOUS

## 2014-03-02 MED ORDER — LIDOCAINE HCL (CARDIAC) 20 MG/ML IV SOLN
INTRAVENOUS | Status: AC
  Filled 2014-03-02: qty 5

## 2014-03-02 MED ORDER — FENTANYL CITRATE 0.05 MG/ML IJ SOLN
100.0000 ug | INTRAMUSCULAR | Status: DC | PRN
  Administered 2014-03-02 – 2014-03-03 (×11): 100 ug via INTRAVENOUS
  Filled 2014-03-02 (×11): qty 2

## 2014-03-02 MED ORDER — ROCURONIUM BROMIDE 50 MG/5ML IV SOLN
INTRAVENOUS | Status: AC
  Filled 2014-03-02: qty 2

## 2014-03-02 MED ORDER — PIPERACILLIN-TAZOBACTAM 3.375 G IVPB 30 MIN: 3.3750 g | INTRAVENOUS | Status: DC

## 2014-03-02 MED FILL — Medication: Qty: 1 | Status: AC

## 2014-03-02 NOTE — H&P (Addendum)
PULMONARY / CRITICAL CARE MEDICINE   Name: [name] [name] MRN: [id number] DOB: [birth year]    ADMISSION DATE:  03/02/2014   REFERRING [name] :  edp  CHIEF COMPLAINT: cardiac arrest  INITIAL PRESENTATION: SOB/Cardiac arrest  STUDIES:    SIGNIFICANT EVENTS: 9/3 resp->cardiac arrest   HISTORY OF PRESENT ILLNESS:   [name](smoker) who was seen in ED 8/31 with SOB, treated and released. She presented to [facility] ED 9/3 with acute resp distress and subsequently has respiratory/cardiac arrest with one round of epi/cpr/intubation. She was acidotic but improved with ventilatory support. She will be admitted to ICU, may ned CT of head if she does not wake up.  PAST MEDICAL HISTORY :  Past Medical History  Diagnosis Date  . Back pain   . Gastritis    Past Surgical History  Procedure Laterality Date  . Cesarean section     Prior to Admission medications   Medication Sig Start Date End Date Taking? Authorizing Provider  fexofenadine (ALLEGRA) 60 MG tablet Take 1 tablet (60 mg total) by mouth 2 (two) times daily. 02/27/14   Rolan Bucco, [name]   No Known Allergies  FAMILY HISTORY:  History reviewed. No pertinent family history. SOCIAL HISTORY:  reports that she has been smoking.  She has never used smokeless tobacco. She reports that she drinks alcohol. She reports that she does not use illicit drugs.  REVIEW OF SYSTEMS:  na  SUBJECTIVE:   VITAL SIGNS: Pulse Rate:  [40-141] 94 (09/03 1244) Resp:  [15-43] 24 (09/03 1308) BP: (145-186)/(89-118) 145/94 mmHg (09/03 1050) SpO2:  [77 %-100 %] 100 % (09/03 1244) Arterial Line BP: (102-157)/(55-79) 116/55 mmHg (09/03 1308) FiO2 (%):  [60 %-100 %] 60 % (09/03 1221) Weight:  [130 lb (58.968 kg)] 130 lb (58.968 kg) (09/03 1043) HEMODYNAMICS:   VENTILATOR SETTINGS: Vent Mode:  [-] PRVC FiO2 (%):  [60 %-100 %] 60 % Set Rate:  [16 bmp-24 bmp] 24 bmp Vt Set:  [330 mL-460 mL] 460 mL PEEP:  [5 cmH20] 5 cmH20 Plateau Pressure:  [30  cmH20-38 cmH20] 30 cmH20 INTAKE / OUTPUT:  Intake/Output Summary (Last 24 hours) at 03/02/14 1316 Last data filed at 03/02/14 1135  Gross per 24 hour  Intake      0 ml  Output    150 ml  Net   -150 ml    PHYSICAL EXAMINATION: General:  WNWD Hispanic female,sedated on vent.  Neuro:  Sedated on vent with diprivan HEENT:  No JVD/LAN Cardiovascular:  HSR RRR Lungs:  Coarse rhonchi rt >left Abdomen:  Soft,+bs Musculoskeletal:  Intact Skin:  Warm and dry  LABS:  CBC  Recent Labs Lab 02/27/14 1124 03/02/14 1002  WBC 12.0* 18.2*  HGB 12.3 12.3  HCT 36.9 39.5  PLT 293 313   Coag's  Recent Labs Lab 03/02/14 1235  APTT 24  INR 1.08   BMET  Recent Labs Lab 02/27/14 1124 03/02/14 1002  NA 143 143  K 3.9 4.3  CL 105 103  CO2 23 15*  BUN 15 20  CREATININE 0.79 1.24*  GLUCOSE 98 300*   Electrolytes  Recent Labs Lab 02/27/14 1124 03/02/14 1002 03/02/14 1235  CALCIUM 9.2 9.4  --   PHOS  --   --  4.4   Sepsis Markers  Recent Labs Lab 03/02/14 1012 03/02/14 1235  LATICACIDVEN 10.62* 2.3*   ABG  Recent Labs Lab 03/02/14 0952 03/02/14 1156  PHART 6.873* 7.237*  PCO2ART 99.1* 58.1*  PO2ART 116.0* 142.0*  Liver Enzymes No results found for this basename: AST, ALT, ALKPHOS, BILITOT, ALBUMIN,  in the last 168 hours Cardiac Enzymes  Recent Labs Lab 03/02/14 1002  PROBNP 1799.0*   Glucose  Recent Labs Lab 03/02/14 0951  GLUCAP 256*    Imaging No results found.   ASSESSMENT / PLAN:  PULMONARY OETT 9/3>> A: VDRF secondary to BASBZ presumed infectious process. Hypercarbia P:   Vent bundle ABG serial Abx CT chest  CARDIOVASCULAR CVL A:  Post cardiac arrest secondary resp arrest P:  Vent bundle BD's   RENAL A:   No acute issue P:     GASTROINTESTINAL A:   GI protection P:   PPI  HEMATOLOGIC A:   No acute issue P:    INFECTIOUS A:   Presumed CAP P:   BCx2  9/3>> UC  9/3>> Sputum 9/3>> Abx:  vanc,  start date 9/3, day 0/x Abx: pip itazo, start date 9/3, day 0/x   ENDOCRINE A:   No acute issue   P:     NEUROLOGIC A:   Sedated on vent. Post arrest P:   RASS goal: -1 Needs wake up assessment to determine if she needs CT head  TODAY'S SUMMARY:  [name](smoker) who was seen in ED 8/31 with SOB, treated and released. She presented to [facility] ED 9/3 with acute resp distress and subsequently has respiratory/cardiac arrest with one round of epi/cpr/intubation.   [name] [name] ACNP [name] [name] Pager [phone] till 3 pm If no answer page [phone] 03/02/2014, 1:22 PM   I have personally obtained a history, examined the patient, evaluated laboratory and imaging results, formulated the assessment and plan and placed orders. CRITICAL CARE: The patient is critically ill with multiple organ systems failure and requires high complexity decision making for assessment and support, frequent evaluation and titration of therapies, application of advanced monitoring technologies and extensive interpretation of multiple databases. Critical Care Time devoted to patient care services described in this note is 60 minutes.    Pulmonary and Critical Care Medicine [facility] Pager: [phone]  03/02/2014, 1:16 PM   Patient seen and examined with NP [name] [name].  Lab, images, and vitals reviewed. Agree with NP [name] [name] assessment and plan with the following exceptions:  1. VDRF - cont with the MV, wean as tolerated - she has bilateral airspace dz with interstitial changes, which appear to be chronic.  - cont with serial abgs - f\u CTA Chest to eval for PE.  - check UDS - tracheal aspirate, sputum afb  Critical Care Time = 60 mins  [name] [name] [place] [facility] Pager [phone] On Call Pager [phone]

## 2014-03-02 NOTE — Progress Notes (Signed)
Family arrived this evening; english speaking daughter is Lanora Manis. The patient does not speak any [place]. She states the family and patient  are here visiting from [place]. She has a husband. Lanora Manis says "They are fighting". The patient's sister notified her husband and he reportedly said  he was" not interested". Lanora Manis said it would be the patient's Mother and Father to make decisions for her while she was here. I requested that someone who could speak English be here in the mornings around 0800 to discuss plan of care. The family was given her belongings which were a Samsung mobile phone, a pair of round black earrings with clear stones, and a nose stud of clear stone, Flip flops, clothing articles.

## 2014-03-02 NOTE — ED Notes (Signed)
Per EMS, spanish speaking pt was clammy and wheezing on arrival, albuterol neb administered en route. Per EMS pt's distress has increased during transport, new onset of facial flushing. Pt appears to be in great distress, unable or unwilling to sit down.

## 2014-03-02 NOTE — Progress Notes (Signed)
RT assisted with PT transport from [facility] ED RESPB to [facility] CT while on vent. PT transported on 100% Fi02. Transport uneventful. PT now resides in [facility] ED RESP B- and returned to previous Fi02 60%- RN is at bedside.

## 2014-03-02 NOTE — Code Documentation (Signed)
ROSC established.

## 2014-03-02 NOTE — Progress Notes (Signed)
  CARE MANAGEMENT ED NOTE 03/02/2014  Patient:  [name] [name],[name] [name]   Account Number:  [id number]  Date Initiated:  03/02/2014  Documentation initiated by:  Edd Arbour  Subjective/Objective Assessment:   48 yr old self pay pt with address listed for [place] No family present with pt Spanish speaking pt was clammy & wheezing on arrival, albuterol neb administered en route. Per EMS pt's distress has increased during transport, new onset of     Subjective/Objective Assessment Detail:   facial flushing. Pt appears to be in great distress, unable or unwilling to sit down. Sat dropped to 77% ph 6.873 in [facility] ED pt went into cardiac & respiratory arrest Intubated      This is the pt 2nd ED visit in last 4 days First was for URI on 02/27/14     Action/Plan:   Pt unable to be assess at this time for pcp/resource needs Admitted to ICU   Action/Plan Detail:   Anticipated DC Date:  03/05/2014     Status Recommendation to Physician:   Result of Recommendation:    Other ED Services  Consult Working Plan    DC Planning Services  Other  PCP issues  Outpatient Services - Pt will follow up    Choice offered to / List presented to:            Status of service:  Completed, signed off  ED Comments:   ED Comments Detail:

## 2014-03-02 NOTE — Procedures (Signed)
Central Venous Catheter Insertion Procedure Note Sherrilee Steigerwald [id number] 09-12-1965  Procedure: Insertion of Central Venous Catheter Indications: Assessment of intravascular volume, Drug and/or fluid administration and Frequent blood sampling  Procedure Details Consent: Unable to obtain consent because of emergent medical necessity. Time Out: Verified patient identification, verified procedure, site/side was marked, verified correct patient position, special equipment/implants available, medications/allergies/relevent history reviewed, required imaging and test results available.  Performed  Maximum sterile technique was used including antiseptics, cap, gloves, gown, hand hygiene, mask and sheet. Skin prep: Chlorhexidine; local anesthetic administered A antimicrobial bonded/coated triple lumen catheter was placed in the left internal jugular vein using the Seldinger technique. Ultrasound guidance used.Yes.   Catheter placed to 20 cm. Blood aspirated via all 3 ports and then flushed x 3. Line sutured x 2 and dressing applied.  Evaluation Blood flow good Complications: No apparent complications Patient did tolerate procedure well. Chest X-ray ordered to verify placement.  CXR: pending.  Brett Canales Leira Regino ACNP [facility] P[facility] Pager [phone] till 3 pm If no answer page [phone] 03/02/2014, 3:21 PM

## 2014-03-02 NOTE — Progress Notes (Signed)
Patient speaks Spanish.  Will need translator when she is not on the ventilator

## 2014-03-02 NOTE — Procedures (Signed)
Present for procedure  Stephanie Acre, MD [place] [facility] Pager [phone] On Call Pager [phone]

## 2014-03-02 NOTE — ED Notes (Signed)
Notified Dee respiratory for transport. Dee in sterile procedure.  Will come at next opportunity. Pt remains stable.

## 2014-03-02 NOTE — ED Notes (Addendum)
Pt returned from CT.  Continue pt care.  RN remaining on alert for changes at bedside

## 2014-03-02 NOTE — ED Provider Notes (Addendum)
CSN: [id number]     Arrival date & time 03/02/14  7846 History   First MD Initiated Contact with Patient 03/02/14 0935     Chief Complaint  Patient presents with  . Cardiac Arrest  . Respiratory Arrest     (Consider location/radiation/quality/duration/timing/severity/associated sxs/prior Treatment) HPI Comments: Pt comes in with cc of respiratory distress.  LEVEL 5 CAVEAT - SEVERE RESPIRATORY DEPRESS.  Pt brought to the ER with cc of resp distress. Per EMS, when they arrived, pt was wheezing, and had elevated RR. She was started on breathing tx, and was doing well (BP in the 130s, HR 90-110), but en route, she suddenly got extremely combative, pulled her [place], and became restless.   Pt is spanish speaking only. She is noted to be combative, and RR - 40s, HR 170s. Pt denies chest pain at arrival, but she is really not answering any other questions. Interpretor phone and spanish speaking staff utilized, but pt didn't give any hx.  Records indicate recent visit for some resp complains. Had CT chest with contrast, that showed some lymphadenopathy mediastinal, and parenchymal thickening.  The history is provided by the patient.    Past Medical History  Diagnosis Date  . Back pain   . Gastritis    Past Surgical History  Procedure Laterality Date  . Cesarean section     History reviewed. No pertinent family history. History  Substance Use Topics  . Smoking status: Current Every Day Smoker  . Smokeless tobacco: Never Used  . Alcohol Use: Yes   OB History   Grav Para Term Preterm Abortions TAB SAB Ect Mult Living                 Review of Systems  Unable to perform ROS: Mental status change  Constitutional: Positive for diaphoresis.  Respiratory: Positive for shortness of breath and wheezing.       Allergies  Review of patient's allergies indicates no known allergies.  Home Medications   Prior to Admission medications   Medication Sig Start Date End Date Taking?  Authorizing Provider  fexofenadine (ALLEGRA) 60 MG tablet Take 1 tablet (60 mg total) by mouth 2 (two) times daily. 02/27/14   Rolan Bucco, MD   BP 145/94  Pulse 116  Resp 25  Ht  (1.626 m)  Wt 130 lb (58.968 kg)  BMI 22.30 kg/m2  SpO2 100% Physical Exam  Nursing note and vitals reviewed. Constitutional: She appears well-developed.  HENT:  Head: Atraumatic.  Eyes: Conjunctivae are normal.  Neck: Neck supple.  Cardiovascular:  No murmur heard. Pulmonary/Chest: She is in respiratory distress. She has wheezes. She has no rales.  RR - 40s, with good air movement, + wheezing, worse on the right side. No JVD.    ED Course  ARTERIAL BLOOD GAS Date/Time: 03/02/2014 1:20 PM Performed by: Derwood Kaplan Authorized by: Derwood Kaplan Consent: The procedure was performed in an emergent situation. Risks and benefits: risks, benefits and alternatives were discussed Patient identity confirmed: arm band Anesthesia: local infiltration Location: right radial Method: Single percutaneous needle puncture Manual pressure: manual pressure applied for more than 5 minutes Post-procedure: dressing applied Post-procedure CMS: normal  ARTERIAL LINE Date/Time: 03/02/2014 1:22 PM Performed by: Derwood Kaplan Authorized by: Derwood Kaplan Consent: The procedure was performed in an emergent situation. Risks and benefits: risks, benefits and alternatives were discussed Consent given by: patient Preparation: Patient was prepped and draped in the usual sterile fashion. Indications: multiple ABGs Location: right radial Needle gauge: 18  Seldinger technique: Seldinger technique used Number of attempts: 1 Post-procedure: line sutured Post-procedure CMS: normal   (including critical care time) Labs Review Labs Reviewed  BLOOD GAS, ARTERIAL - Abnormal; Notable for the following:    pH, Arterial 6.873 (*)    pCO2 arterial 99.1 (*)    pO2, Arterial 116.0 (*)    Bicarbonate 17.3 (*)     Acid-base deficit 19.0 (*)    All other components within normal limits  CBC WITH DIFFERENTIAL - Abnormal; Notable for the following:    WBC 18.2 (*)    Neutro Abs 10.1 (*)    Lymphs Abs 6.4 (*)    Monocytes Absolute 1.5 (*)    All other components within normal limits  PRO B NATRIURETIC PEPTIDE - Abnormal; Notable for the following:    Pro B Natriuretic peptide (BNP) 1799.0 (*)    All other components within normal limits  BASIC METABOLIC PANEL - Abnormal; Notable for the following:    CO2 15 (*)    Glucose, Bld 300 (*)    Creatinine, Ser 1.24 (*)    GFR calc non Af Amer 51 (*)    GFR calc Af Amer 59 (*)    Anion gap 25 (*)    All other components within normal limits  BLOOD GAS, ARTERIAL - Abnormal; Notable for the following:    pH, Arterial 7.237 (*)    pCO2 arterial 58.1 (*)    pO2, Arterial 142.0 (*)    Acid-base deficit 3.6 (*)    All other components within normal limits  URINALYSIS, ROUTINE W REFLEX MICROSCOPIC - Abnormal; Notable for the following:    Glucose, UA 500 (*)    Hgb urine dipstick MODERATE (*)    Protein, ur 30 (*)    All other components within normal limits  LACTIC ACID, PLASMA - Abnormal; Notable for the following:    Lactic Acid, Venous 2.3 (*)    All other components within normal limits  URINE RAPID DRUG SCREEN (HOSP PERFORMED) - Abnormal; Notable for the following:    Opiates POSITIVE (*)    All other components within normal limits  CBG MONITORING, ED - Abnormal; Notable for the following:    Glucose-Capillary 256 (*)    All other components within normal limits  I-STAT CG4 LACTIC ACID, ED - Abnormal; Notable for the following:    Lactic Acid, Venous 10.62 (*)    All other components within normal limits  CULTURE, BLOOD (ROUTINE X 2)  CULTURE, BLOOD (ROUTINE X 2)  URINE CULTURE  CULTURE, RESPIRATORY (NON-EXPECTORATED)  PHOSPHORUS  PROTIME-INR  APTT  URINE MICROSCOPIC-ADD ON  PATHOLOGIST SMEAR REVIEW  PROCALCITONIN  CORTISOL  STREP  PNEUMONIAE URINARY ANTIGEN  LEGIONELLA ANTIGEN, URINE  TRIGLYCERIDES  BLOOD GAS, ARTERIAL  I-STAT TROPOININ, ED    Imaging Review Dg Chest Portable 1 View  03/02/2014   CLINICAL DATA:  Cardiac and respiratory arrest  EXAM: PORTABLE CHEST - 1 VIEW  COMPARISON:  Portable chest x-ray of March 02, 2014 at 0952 hr.  FINDINGS: The patient has undergone interval intubation of the trachea. The tip of the tube lies approximately 3 0.6 cm above the crotch of the carina. The esophagogastric tube tip projects off the inferior margin of the image. There are external pacing and defibrillation pannus present. The lungs are adequately inflated but exhibit diffusely increased interstitial markings more conspicuous than earlier today. There is no pneumothorax. A small pleural effusion on the right is suspected but is unchanged.  IMPRESSION: The endotracheal  and esophagogastric tubes appear to be in appropriate position. There is diffuse interstitial edema more conspicuous than on the earlier study.   Electronically Signed   By: David  [place]   On: 03/02/2014 11:09   Dg Chest Portable 1 View  03/02/2014   CLINICAL DATA:  Short of breath  EXAM: PORTABLE CHEST - 1 VIEW  COMPARISON:  02/27/2014  FINDINGS: Cardiac enlargement with progression of diffuse bilateral airspace disease, possible pneumonia versus infection. Right pleural effusion slightly larger. No left effusion. Bibasilar atelectasis has progressed.  IMPRESSION: Worsening bilateral airspace disease. This may represent pulmonary edema versus infection.   Electronically Signed   By: Marlan Palau M.D.   On: 03/02/2014 10:16     EKG Interpretation   Date/Time:  Thursday March 02 2014 09:48:46 EDT Ventricular Rate:  170 PR Interval:  111 QRS Duration: 82 QT Interval:  320 QTC Calculation: 538 R Axis:   90 Text Interpretation:  Sinus tachycardia Probable left atrial enlargement  Borderline right axis deviation Left ventricular hypertrophy Nonspecific  T  abnormalities, lateral leads Prolonged QT interval Tachycardia new  Confirmed by Rhunette Croft, MD, Janey Genta [phone]) on 03/02/2014 10:06:44 AM       Date: 03/02/2014  Rate: 118  Rhythm: sinus tachycardia  QRS Axis: right  Intervals: QT prolonged  ST/T Wave abnormalities: nonspecific ST/T changes  Conduction Disutrbances:none  Narrative Interpretation:   Old EKG Reviewed: unchanged - except qt prolongation   MDM   Final diagnoses:  Acute respiratory failure with hypercapnia  Respiratory arrest  Lactic acidosis  Respiratory acidosis    Cardiopulmonary Resuscitation (CPR) Procedure Note Directed/Performed by: Derwood Kaplan I personally directed ancillary staff and/or performed CPR in an effort to regain return of spontaneous circulation and to maintain cardiac, neuro and systemic perfusion.    CRITICAL CARE Performed by: Derwood Kaplan   Total critical care time: 35 minutes - patient care post arrest and consultation.  Critical care time was exclusive of separately billable procedures and treating other patients.  Critical care was necessary to treat or prevent imminent or life-threatening deterioration.  Critical care was time spent personally by me on the following activities: development of treatment plan with patient and/or surrogate as well as nursing, discussions with consultants, evaluation of patient's response to treatment, examination of patient, obtaining history from patient or surrogate, ordering and performing treatments and interventions, ordering and review of laboratory studies, ordering and review of radiographic studies, pulse oximetry and re-evaluation of patient's condition.    Pt comes in with resp distress. Spanish speaking, with rest distress, accessory muscle use, led to [place] getting no hx. EMS reports patient being a lot calm when they picked her up.  BP was in the 150s, HR tachycardia, no rales. Pt didn't shows flash pulm edema like presentation at  arrival. STAT CXR ordered. Pt also having AMS. She is slightly diaphoretic, mild wheezing (post 1 tx) - and panic attack favored over COPD exacerbation - however, given AMS, ABG was done by me immediately. Pt was given ativan .5 mg initially, and then 1 mg - which improved the RR (20s) and HR (150s). BP stable.  Soon after i left the room, i was alerted that patient became unresponsive and went into PEA. Pt intubated. CPR started. Pt given 1 round of epi and chest compression x 3 minutes - and it led to ROSC. While resuscitating, pt's ABG showed severe resp acidosis, and she was given bicarb prior to the intubation. 2 amps bicarb total.   CCM called.  CT PE study per their request. Empiric antibiotics.    Derwood Kaplan, MD 03/02/14 1329  Derwood Kaplan, MD 03/02/14 1524

## 2014-03-02 NOTE — Progress Notes (Deleted)
  CARE MANAGEMENT ED NOTE 03/02/2014  Patient:  Azalia Bilis   Account Number:  [id number]  Date Initiated:  03/02/2014  Documentation initiated by:  Edd Arbour  Subjective/Objective Assessment:   48 yr old [place] access pt no pcp listed     Subjective/Objective Assessment Detail:   [facility] is pcp per EPIC e medicaid chart response     Action/Plan:   EPIC updated   Action/Plan Detail:   Anticipated DC Date:       Status Recommendation to Physician:   Result of Recommendation:    Other ED Services  Consult Working Plan    DC Planning Services  Outpatient Services - Pt will follow up  Other  PCP issues    Choice offered to / List presented to:            Status of service:  Completed, signed off  ED Comments:   ED Comments Detail:

## 2014-03-02 NOTE — ED Notes (Signed)
Attempted to call report, RN to return call 

## 2014-03-02 NOTE — ED Notes (Signed)
Pt became more agitated.  Increased diprovan and gave fentanyl as ordered.  Continue to monitor.

## 2014-03-02 NOTE — ED Provider Notes (Signed)
INTUBATION Date/Time: 03/02/2014 2:20 PM Performed by: Mirian Mo Authorized by: Mirian Mo Consent: The procedure was performed in an emergent situation. Indications: respiratory failure,  airway protection and  hypercapnia Intubation method: direct Patient status: paralyzed (RSI) Preoxygenation: BVM Laryngoscope size: Mac 4 Tube size: 7.5 mm Tube type: cuffed Number of attempts: 1 Cords visualized: yes Post-procedure assessment: chest rise and CO2 detector Breath sounds: equal Cuff inflated: yes ETT to lip: 23 cm Tube secured with: ETT holder Chest x-ray interpreted by me, other physician and radiologist. Chest x-ray findings: endotracheal tube in appropriate position Patient tolerance: Patient tolerated the procedure well with no immediate complications.    Mirian Mo, MD 03/02/14 [phone]

## 2014-03-02 NOTE — Progress Notes (Signed)
Right radial arterial line indwelling and functional. Site viewed. WNL.

## 2014-03-02 NOTE — Progress Notes (Signed)
UR completed 

## 2014-03-02 NOTE — Progress Notes (Signed)
ANTIBIOTIC CONSULT NOTE - INITIAL  Pharmacy Consult for Vancomycin and Zosyn Indication: Aspiration pneumonia  No Known Allergies  Patient Measurements: Height: 5\' 4"  (162.6 cm) Weight: 130 lb (58.968 kg) IBW/kg (Calculated) : 54.7  Vital Signs: BP: 145/94 mmHg (09/03 1050) Pulse Rate: 94 (09/03 1244) Intake/Output from previous day:   Intake/Output from this shift: Total I/O In: -  Out: 150 [Urine:150]  Labs:  Recent Labs  03/02/14 1002  WBC 18.2*  HGB 12.3  PLT 313  CREATININE 1.24*   Estimated Creatinine Clearance: 49 ml/min (by C-G formula based on Cr of 1.24). No results found for this basename: VANCOTROUGH, VANCOPEAK, VANCORANDOM, GENTTROUGH, GENTPEAK, GENTRANDOM, TOBRATROUGH, TOBRAPEAK, TOBRARND, AMIKACINPEAK, AMIKACINTROU, AMIKACIN,  in the last 72 hours   Microbiology: No results found for this or any previous visit (from the past 720 hour(s)).  Medical History: Past Medical History  Diagnosis Date  . Back pain   . Gastritis     Medications:   (Not in a hospital admission) Assessment: 47yo F with resp distress, experienced cardiac arrest in the ED, ROSC was established and she was placed on the ventilator. CTA negative for PE. Recent admission with SOB, enlarged lymph nodes and abnormal lung findings. Ongoing tobacco use. Pharmacy is asked to dose broad-spectrum antibiotics for suspected aspiration pneumonia. SCr is elevated, CrCl 49ml/min CG, 64ml/min/1.73m2.  Goal of Therapy:  Vancomycin trough level 15-20 mcg/ml Appropriate antibiotic dosing for renal function; eradication of infection  Plan:   Vancomycin 1g STAT then 750mg  IV q12h.  Zosyn 3.375g STAT over <MEASUREME5Almon432-3SheAdvaLeGarwToguKentuckySheLaney PotasPrudy FeelYRC<MEASUREMENT(7Almon(539) 2HaverfordAdvaLeGarwinMountain HoPKentuckySheLaney PotasPrudy FeelYRC<MEASUREMENT(5Almon(773)4OlAdvaLeGarwinCoquille Valley KentuckySheLaney PotasPrudy FeelYRC<MEASUREMENT8Almon509-3CAdvaLeGarwinSurgery CentePKentuckySheLaney PotasPrudy FeelYRC<MEASUREMENT(3Almon(763)5IndiAdvaLeGarwin(2[place] SuKentuckySheLaney PotasPrudy FeelYRC<MEASUREMENT3Almon620 8RAdvaLeGarwinLake Butler Hospital HaPhKentuckySheLaney PotasPrudy FeelYRC<MEASUREMENT5Almon(812)AdvaLeGarwLawnwood Pavilion - PsyKentuckySheLaney PotasPrudy FeelYRC<MEASUREMENT7Almon606AdvaLeGarwinMclarenKentuckySheLaney PotasPrudy FeelYRC<MEASUREMENT(9Almon678-AdvaLeGarwRiPKentuckySheLaney PotasPrudy FeelYRC<MEASUREMENT2Almon725EaAdvaLeGarwSurgery CentePKentuckySheLaney PotasPrudy FeelYRC<MEASUR<MEASUREMENT(5Almon859 7FAdvaLeGarwinOvieKentuckySheLaney PotasPrudy FeelYRC<MEASUREMENT9Almon(603) AdvaLeGarwinChristus Cabrini SKentuckySheLaney PotasPrudy FeelYRC<MEASUREMENT(87Almon94AdvaLeGarwin(7[facility] OKentuckySheLaney PotasPrudy FeelYRC<MEASUREMENT8Almon585AdvaLeGarwinVa Medical Center -KentuckySheLaney PotasPrudy FeelYRC<MEASUREMENT(6Almon781-3PoAdvaLeGarwCascade EndKentuckySheLaney PotasPrudy FeelYRC<MEASUREMENT6Almon262-7SevAdvaLeGarwinBaptist Memorial HospitPhKentuckySheLaney PotasPrudy FeelYRC<MEASUREMENT9Almon628-5ForAdvaLeGarwinOutpatient Surgery Center At Tgh PKentuckySheLaney PotasPrudy FeelYRC<MEASUREMENT(3Almon(620) 4FowAdvaLeGarwinLouisvilKentuckySheLaney PotasPrudy FeelYRC<MEASUREMENT5Almon(361) 4WillAdvaLeGarwin(5[facility] KentuckySheLaney PotasPrudy FeelYRC<MEASUREMENT6Almon(972) 4CeAdvaLeGarwinHorizon MedicalPKentuckySheLaney PotasPrudy FeelYRC<MEASUREMENAlmon(339) 1WAdvaLeGarwLakeland BehavioKentuckySheLaney PotasPrudy FeelYRC<MEASUREMENT5Almon805-2AdvaLeGarwinBaptist EKentuckySheLaney PotasPrudy FeelYRC<MEASUREMENT(7Almon867-4CollAdvaLeGarwinCommuniKentuckySheLaney PotasPrudy FeelYRC<MEASUREMENT(3Almon770-0TAdvaLeGarwinVision Surgery AndKentuckySheLaney PotasPrudy FeelYRC<MEASUREMENT7Almon(812)4AdvaLeGarwinColumbia Eye And Specialty SPhKentuckySheLaney PotasPrudy FeelYRC<MEASUREMENT5Almon314AdvaLeGarwAscension EaglKentuckySheLaney PotasPrudy FeelYRC<MEASUREMENT2Almon606-0WhitesAdvaLeGarwinEncompass Health Rehabilitation HospitalKentuckySheLaney PotasPrudy FeelYRC<MEASUREMENT5Almon(765) AdvaLeGarwinMerwick Rehabilitation Hospital And NuPhKentuckySheLaney PotasPrudy FeelYRC Worldwidele Sidleand5g IV Q8H infused over 4hrs.  Measure Vanc trough at steady state.  Follow up renal fxn and culture results.  Tom Shawneequa Baldridge, PharmD, pager [phone]. 03/02/2014,1:21 PM.

## 2014-03-02 NOTE — ED Notes (Signed)
Attempting to transport to floor with vent.  Pt becoming agitated and decreasing sats.  Will medicate.

## 2014-03-02 NOTE — ED Notes (Signed)
Bed: WA10 Expected date:  Expected time:  Means of arrival:  Comments: resp distress 

## 2014-03-03 ENCOUNTER — Inpatient Hospital Stay (HOSPITAL_COMMUNITY): Payer: Self-pay

## 2014-03-03 DIAGNOSIS — I059 Rheumatic mitral valve disease, unspecified: Secondary | ICD-10-CM

## 2014-03-03 LAB — GLUCOSE, CAPILLARY
GLUCOSE-CAPILLARY: 103 mg/dL — AB (ref 70–99)
GLUCOSE-CAPILLARY: 85 mg/dL (ref 70–99)
GLUCOSE-CAPILLARY: 86 mg/dL (ref 70–99)
Glucose-Capillary: 104 mg/dL — ABNORMAL HIGH (ref 70–99)
Glucose-Capillary: 79 mg/dL (ref 70–99)
Glucose-Capillary: 70 mg/dL (ref 70–99)

## 2014-03-03 LAB — BLOOD GAS, ARTERIAL
Acid-Base Excess: 0.3 mmol/L (ref 0.0–2.0)
Acid-base deficit: 0.7 mmol/L (ref 0.0–2.0)
BICARBONATE: 24.8 meq/L — AB (ref 20.0–24.0)
Bicarbonate: 23.4 mEq/L (ref 20.0–24.0)
DRAWN BY: 235321
Drawn by: 235321
FIO2: 0.4 %
FIO2: 0.6 %
LHR: 14 {breaths}/min
O2 SAT: 95 %
O2 SAT: 88.8 %
PATIENT TEMPERATURE: 38.1
PCO2 ART: 40.6 mmHg (ref 35.0–45.0)
PEEP: 5 cmH2O
PEEP: 5 cmH2O
Patient temperature: 37.9
RATE: 14 resp/min
TCO2: 23 mmol/L (ref 0–100)
TCO2: 21.6 mmol/L (ref 0–100)
VT: 500 mL
VT: 500 mL
pCO2 arterial: 44.1 mmHg (ref 35.0–45.0)
pH, Arterial: 7.375 (ref 7.350–7.450)
pH, Arterial: 7.384 (ref 7.350–7.450)
pO2, Arterial: 80.2 mmHg (ref 80.0–100.0)
pO2, Arterial: 59.7 mmHg — ABNORMAL LOW (ref 80.0–100.0)

## 2014-03-03 LAB — BASIC METABOLIC PANEL
ANION GAP: 11 (ref 5–15)
BUN: 13 mg/dL (ref 6–23)
CHLORIDE: 103 meq/L (ref 96–112)
CO2: 25 meq/L (ref 19–32)
Calcium: 8.4 mg/dL (ref 8.4–10.5)
Creatinine, Ser: 0.8 mg/dL (ref 0.50–1.10)
GFR calc non Af Amer: 87 mL/min — ABNORMAL LOW (ref >90)
Glucose, Bld: 114 mg/dL — ABNORMAL HIGH (ref 70–99)
POTASSIUM: 3.9 meq/L (ref 3.7–5.3)
Sodium: 139 mEq/L (ref 137–147)

## 2014-03-03 LAB — LACTIC ACID, PLASMA: Lactic Acid, Venous: 0.7 mmol/L (ref 0.5–2.2)

## 2014-03-03 LAB — CBC
HCT: 30.2 % — ABNORMAL LOW (ref 36.0–46.0)
Hemoglobin: 10.1 g/dL — ABNORMAL LOW (ref 12.0–15.0)
MCH: 30.1 pg (ref 26.0–34.0)
MCHC: 33.4 g/dL (ref 30.0–36.0)
MCV: 90.1 fL (ref 78.0–100.0)
PLATELETS: 220 10*3/uL (ref 150–400)
RBC: 3.35 MIL/uL — AB (ref 3.87–5.11)
RDW: 13.5 % (ref 11.5–15.5)
WBC: 12.4 10*3/uL — ABNORMAL HIGH (ref 4.0–10.5)

## 2014-03-03 LAB — PATHOLOGIST SMEAR REVIEW

## 2014-03-03 MED ORDER — ACETAMINOPHEN 160 MG/5ML PO SOLN: 650.0000 mg | Freq: Three times a day (TID) | ORAL | Status: DC | PRN

## 2014-03-03 MED ORDER — VANCOMYCIN HCL IN DEXTROSE 750-5 MG/150ML-% IV SOLN
750.0000 mg | Freq: Three times a day (TID) | INTRAVENOUS | Status: DC
  Administered 2014-03-03 – 2014-03-09 (×19): 750 mg via INTRAVENOUS
  Filled 2014-03-03 (×21): qty 150

## 2014-03-03 MED ORDER — SODIUM CHLORIDE 0.9 % IV SOLN
25.0000 ug/h | INTRAVENOUS | Status: DC
  Administered 2014-03-03: 100 ug/h via INTRAVENOUS
  Administered 2014-03-04: 150 ug/h via INTRAVENOUS
  Administered 2014-03-05: 100 ug/h via INTRAVENOUS
  Administered 2014-03-05 (×2): 250 ug/h via INTRAVENOUS
  Administered 2014-03-05: 100 ug/h via INTRAVENOUS
  Administered 2014-03-05: 125 ug/h via INTRAVENOUS
  Administered 2014-03-05: 100 ug/h via INTRAVENOUS
  Administered 2014-03-06: 300 ug/h via INTRAVENOUS
  Administered 2014-03-07: 375 ug/h via INTRAVENOUS
  Administered 2014-03-07: 300 ug/h via INTRAVENOUS
  Administered 2014-03-07 (×2): 350 ug/h via INTRAVENOUS
  Administered 2014-03-08: 100 ug/h via INTRAVENOUS
  Administered 2014-03-08: 400 ug/h via INTRAVENOUS
  Administered 2014-03-08 (×2): 100 ug/h via INTRAVENOUS
  Administered 2014-03-08 (×3): 400 ug/h via INTRAVENOUS
  Administered 2014-03-09 (×2): 100 ug/h via INTRAVENOUS
  Administered 2014-03-09 (×2): 400 ug/h via INTRAVENOUS
  Administered 2014-03-09: 100 ug/h via INTRAVENOUS
  Administered 2014-03-09: 350 ug/h via INTRAVENOUS
  Administered 2014-03-10: 175 ug/h via INTRAVENOUS
  Administered 2014-03-11: 100 ug/h via INTRAVENOUS
  Filled 2014-03-03 (×21): qty 50

## 2014-03-03 MED ORDER — THIAMINE HCL 100 MG/ML IJ SOLN
100.0000 mg | Freq: Every day | INTRAMUSCULAR | Status: DC
  Administered 2014-03-03 – 2014-03-05 (×3): 100 mg via INTRAVENOUS
  Filled 2014-03-03 (×3): qty 2

## 2014-03-03 MED ORDER — FOLIC ACID 5 MG/ML IJ SOLN
1.0000 mg | Freq: Every day | INTRAMUSCULAR | Status: DC
  Administered 2014-03-03 – 2014-03-05 (×3): 1 mg via INTRAVENOUS
  Filled 2014-03-03 (×4): qty 0.2

## 2014-03-03 NOTE — Progress Notes (Signed)
eLink Physician-Brief Progress Note Patient Name: [name] [name] DOB: 09-28-1965 MRN: [id number]   Date of Service  03/03/2014  HPI/Events of Note  abg done, reviewed  eICU Interventions  Peep to 8     Intervention Category Major Interventions: Acid-Base disturbance - evaluation and management  Nelda Bucks. 03/03/2014, 11:36 PM

## 2014-03-03 NOTE — H&P (Signed)
PULMONARY / CRITICAL CARE MEDICINE   Name: [name] [name] MRN: [id number] DOB: Jul 30, 1965    ADMISSION DATE:  03/02/2014   REFERRING MD :  edp  CHIEF COMPLAINT: cardiac arrest  INITIAL PRESENTATION: SOB/Cardiac arrest  STUDIES:    SIGNIFICANT EVENTS: 9/3 resp->cardiac arrest 9/3 intubated  9/3 family at bedside. She is from [place] and is fighting with her husband. One english speaking daughter. Parents are POA per daughter.   HISTORY OF PRESENT ILLNESS:   [name](smoker) who was seen in ED 8/31 with SOB, treated and released. She presented to [facility] ED 9/3 with acute resp distress and subsequently has respiratory/cardiac arrest with one round of epi/cpr/intubation. She was acidotic but improved with ventilatory support. She will be admitted to ICU, may ned CT of head if she does not wake up.  SUBJECTIVE:  Agitated on vent.  VITAL SIGNS: Temp:  [97.9 F (36.6 C)-101.3 F (38.5 C)] 101.1 F (38.4 C) (09/04 0805) Pulse Rate:  [40-141] 121 (09/04 0805) Resp:  [8-43] 22 (09/04 0805) BP: (104-186)/(57-118) 124/81 mmHg (09/04 0805) SpO2:  [77 %-100 %] 95 % (09/04 0805) Arterial Line BP: (98-157)/(47-104) 139/72 mmHg (09/04 0805) FiO2 (%):  [40 %-100 %] 40 % (09/04 0800) Weight:  [130 lb (58.968 kg)-144 lb 2.9 oz (65.4 kg)] 144 lb 2.9 oz (65.4 kg) (09/04 0400) HEMODYNAMICS: CVP:  [12 mmHg-18 mmHg] 13 mmHg VENTILATOR SETTINGS: Vent Mode:  [-] PRVC FiO2 (%):  [40 %-100 %] 40 % Set Rate:  [14 bmp-24 bmp] 14 bmp Vt Set:  [330 mL-500 mL] 500 mL PEEP:  [5 cmH20] 5 cmH20 Plateau Pressure:  [24 cmH20-38 cmH20] 26 cmH20 INTAKE / OUTPUT:  Intake/Output Summary (Last 24 hours) at 03/03/14 0847 Last data filed at 03/03/14 0800  Gross per 24 hour  Intake   1710 ml  Output   3335 ml  Net  -1625 ml    PHYSICAL EXAMINATION: General:  WNWD Hispanic female,sedated on vent with diprivan Neuro:  Sedated or agitated, non english speaking, maex 4 spont. HEENT:  No JVD/LAN,  ott->vent OGT->suction Cardiovascular:  HSR RRR Lungs:  Coarse rhonchi rt >left, no wheeze Abdomen:  Soft,+bs Musculoskeletal:  Intact Skin:  Warm and dry  LABS:  CBC  Recent Labs Lab 02/27/14 1124 03/02/14 1002 03/03/14 0349  WBC 12.0* 18.2* 12.4*  HGB 12.3 12.3 10.1*  HCT 36.9 39.5 30.2*  PLT 293 313 220   Coag's  Recent Labs Lab 03/02/14 1235  APTT 24  INR 1.08   BMET  Recent Labs Lab 02/27/14 1124 03/02/14 1002 03/03/14 0349  NA 143 143 139  K 3.9 4.3 3.9  CL 105 103 103  CO2 23 15* 25  BUN 15 20 13   CREATININE 0.79 1.24* 0.80  GLUCOSE 98 300* 114*   Electrolytes  Recent Labs Lab 02/27/14 1124 03/02/14 1002 03/02/14 1235 03/03/14 0349  CALCIUM 9.2 9.4  --  8.4  PHOS  --   --  4.4  --    Sepsis Markers  Recent Labs Lab 03/02/14 1012 03/02/14 1235  LATICACIDVEN 10.62* 2.3*  PROCALCITON  --  0.17   ABG  Recent Labs Lab 03/02/14 1156 03/02/14 1500 03/03/14 0345  PHART 7.237* 7.319* 7.375  PCO2ART 58.1* 46.5* 44.1  PO2ART 142.0* 63.1* 80.2   Liver Enzymes No results found for this basename: AST, ALT, ALKPHOS, BILITOT, ALBUMIN,  in the last 168 hours Cardiac Enzymes  Recent Labs Lab 03/02/14 1002  PROBNP 1799.0*   Glucose  Recent Labs Lab  03/02/14 0951 03/02/14 1546 03/02/14 1955 03/03/14 0030 03/03/14 0339 03/03/14 0749  GLUCAP 256* 109* 119* 103* 104* 86    Imaging Ct Angio Chest Pe W/cm &/or Wo Cm  03/02/2014   CLINICAL DATA:  Respiratory and cardiac arrest. Evaluate for pulmonary embolism  EXAM: CT ANGIOGRAPHY CHEST WITH CONTRAST  TECHNIQUE: Multidetector CT imaging of the chest was performed using the standard protocol during bolus administration of intravenous contrast. Multiplanar CT image reconstructions and MIPs were obtained to evaluate the vascular anatomy.  CONTRAST:  OMNIPAQUE IOHEXOL 350 MG/ML SOLN  COMPARISON:  02/27/2014  FINDINGS: THORACIC INLET/BODY WALL:  Interval tracheal and esophageal  intubation. The nasogastric tube continues into the stomach at least. Endotracheal tube ends 1 cm above the carina, deeper than on preceding chest x-ray.  There is symmetric ill-defined fluid within the visceral neck compartment, without definitive cause. This could be reactive to recent intubation or from a generalized edematous state. No fluid collection or subcutaneous emphysema.  MEDIASTINUM:  Cardiomegaly, mainly related to left ventricular enlargement. No pericardial effusion. No significant atherosclerotic change. No acute aortic findings.  Evaluation of the pulmonary arterial tree is limited at multiple levels due to respiratory motion. In general the exam is diagnostic to the level of the proximal most segmental pulmonary arteries. No evidence of pulmonary embolism.  Enlargement of subcarinal and hilar lymph nodes which is symmetric and presumably reactive. Mediastinal node followup has been previously recommended.  LUNG WINDOWS:  Diffuse lung consolidation. There is a dependent predilection, although the consolidation is panlobar. No definitive debris within the central airways. The right upper lobe bronchi are distorted and narrowed, with chronic hyperlucency of the right upper lobe consistent with air trapping. The vessels in this region are likewise attenuated. Given posterior segment calcifications bilaterally, this is likely postinfectious scarring - possibly remote TB. Small bilateral pleural effusions without loculation or abnormal pleural enhancement.  UPPER ABDOMEN:  No acute findings.  OSSEOUS:  No acute findings.  Review of the MIP images confirms the above findings.  IMPRESSION: 1. Multi lobar pneumonia with small pleural effusions. The pattern and history of cardiac arrest implicates aspiration. 2. As permitted by moderate motion degradation, no evidence of pulmonary embolism. 3. Endotracheal tube lower than at time of placement, now ending 1 cm above the carina. 4. Left ventricular  enlargement.  No definite pulmonary edema. 5. Right upper lobe scarring with air trapping, likely from remote infection.   Electronically Signed   By: Tiburcio Pea M.D.   On: 03/02/2014 13:12   Dg Chest Port 1 View  03/02/2014   CLINICAL DATA:  Central line placement  EXAM: PORTABLE CHEST - 1 VIEW  COMPARISON:  03/02/2014  FINDINGS: Endotracheal tube 1 cm above the carina.  Left jugular catheter placement with the tip in the SVC. No pneumothorax. NG tube in the stomach  Diffuse bilateral airspace disease unchanged. Small bilateral effusions unchanged. Question pneumonia versus heart failure  IMPRESSION: Endotracheal tube 1 cm above the carina  Sent factors central line placement  Diffuse bilateral airspace disease and bilateral effusions unchanged.   Electronically Signed   By: Marlan Palau M.D.   On: 03/02/2014 15:45   Dg Chest Portable 1 View  03/02/2014   CLINICAL DATA:  Cardiac and respiratory arrest  EXAM: PORTABLE CHEST - 1 VIEW  COMPARISON:  Portable chest x-ray of March 02, 2014 at 0952 hr.  FINDINGS: The patient has undergone interval intubation of the trachea. The tip of the tube lies approximately 3 0.6  cm above the crotch of the carina. The esophagogastric tube tip projects off the inferior margin of the image. There are external pacing and defibrillation pannus present. The lungs are adequately inflated but exhibit diffusely increased interstitial markings more conspicuous than earlier today. There is no pneumothorax. A small pleural effusion on the right is suspected but is unchanged.  IMPRESSION: The endotracheal and esophagogastric tubes appear to be in appropriate position. There is diffuse interstitial edema more conspicuous than on the earlier study.   Electronically Signed   By: David  [place]   On: 03/02/2014 11:09   Dg Chest Portable 1 View  03/02/2014   CLINICAL DATA:  Short of breath  EXAM: PORTABLE CHEST - 1 VIEW  COMPARISON:  02/27/2014  FINDINGS: Cardiac enlargement with  progression of diffuse bilateral airspace disease, possible pneumonia versus infection. Right pleural effusion slightly larger. No left effusion. Bibasilar atelectasis has progressed.  IMPRESSION: Worsening bilateral airspace disease. This may represent pulmonary edema versus infection.   Electronically Signed   By: Marlan Palau M.D.   On: 03/02/2014 10:16     ASSESSMENT / PLAN:  PULMONARY OETT 9/3>> A: VDRF secondary to BASBZ presumed infectious process. Hypercarbia CT chest cw multilobar pna/rule out tb P:   Vent bundle ABG serial Abx Check AFB  CARDIOVASCULAR CVL 9/3 l i j cvl>> Aline 9/3>> A:  Post cardiac arrest secondary resp arrest P:  Pressors as needed Serial CE(neg)   RENAL A:   No acute issue P:     GASTROINTESTINAL A:   GI protection P:   PPI  HEMATOLOGIC A:   No acute issue P:    INFECTIOUS A:   Presumed CAP ?TB P:   BCx2  9/3>> UC  9/3>> Sputum 9/3>> Quan gold 9/4>> Abx:  vanc, start date 9/3, day 1/x Abx: pip itazo, start date 9/3, day 1/x   ENDOCRINE A:   No acute issue   P:     NEUROLOGIC A:   Sedated on vent. Post arrest P:   RASS goal: -1 Needs wake up assessment to determine if she needs CT head  TODAY'S SUMMARY:  Hypercarbia is improved. Awake when not sedated. Non english speaking. Presumed multilobar pna with a concern for ? TB, sputum for tb and quan gold study.   Brett Canales Minor ACNP [facility] Pager [phone] till 3 pm If no answer page [phone] 03/03/2014, 8:47 AM  Patient seen and examined with NP S. Minor.  Lab, images, and vitals reviewed. Agree with NP S. Minor assessment and plan with the following exceptions:   Respiratory failure - etiology not clear yet, she has chronic interstitial changes on her images with also acute findings - DDx - infection, allergen, pneumonitis - cont with above management and treat as PNA with mild ARDS (PF ration 200.5)    Critical Care Time =  Stephanie Acre,  MD [facility] Pager [phone] On Call Pager [phone]

## 2014-03-03 NOTE — Progress Notes (Signed)
ANTIBIOTIC CONSULT NOTE - INITIAL  Pharmacy Consult for Vancomycin and Zosyn Indication: Aspiration pneumonia  No Known Allergies  Patient Measurements: Height: 5\' 6"  (167.6 cm) Weight: 144 lb 2.9 oz (65.4 kg) IBW/kg (Calculated) : 59.3  Vital Signs: Temp: 102 F (38.9 C) (09/04 0900) Temp src: Core (Comment) (09/04 0800) BP: 124/81 mmHg (09/04 0805) Pulse Rate: 119 (09/04 0900) Intake/Output from previous day: 09/03 0701 - 09/04 0700 In: 1640 [I.V.:1050; NG/GT:60; IV Piggyback:500] Out: 3185 [Urine:3125; Emesis/NG output:60] Intake/Output from this shift: Total I/O In: 70 [I.V.:70] Out: 150 [Urine:150]  Labs:  Recent Labs  03/02/14 1002 03/03/14 0349  WBC 18.2* 12.4*  HGB 12.3 10.1*  PLT 313 220  CREATININE 1.24* 0.80   Estimated Creatinine Clearance: 82.3 ml/min (by C-G formula based on Cr of 0.8). No results found for this basename: VANCOTROUGH, Leodis Binet, VANCORANDOM, GENTTROUGH, GENTPEAK, GENTRANDOM, TOBRATROUGH, TOBRAPEAK, TOBRARND, AMIKACINPEAK, AMIKACINTROU, AMIKACIN,  in the last 72 hours   Microbiology: Recent Results (from the past 720 hour(s))  MRSA PCR SCREENING     Status: None   Collection Time    03/02/14  3:12 PM      Result Value Ref Range Status   MRSA by PCR NEGATIVE  NEGATIVE Final   Comment:            The GeneXpert MRSA Assay (FDA     approved for NASAL specimens     only), is one component of a     comprehensive MRSA colonization     surveillance program. It is not     intended to diagnose MRSA     infection nor to guide or     monitor treatment for     MRSA infections.    Medical History: Past Medical History  Diagnosis Date  . Back pain   . Gastritis     Medications:  Prescriptions prior to admission  Medication Sig Dispense Refill  . fexofenadine (ALLEGRA) 60 MG tablet Take 1 tablet (60 mg total) by mouth 2 (two) times daily.  20 tablet  0   Assessment: 48yo F with resp distress, experienced cardiac arrest in the ED,  ROSC was established and she was placed on the ventilator. CTA negative for PE. Recent admission with SOB, enlarged lymph nodes and abnormal lung findings. Ongoing tobacco use. Pharmacy is asked to dose broad-spectrum antibiotics for suspected aspiration pneumonia.   9/3 >> Zosyn >> 9/3 >> Vanc >>   Tmax: 102 WBCs: elevated, trending down Renal: improved to SCr 0.8, CrCl 90CG, 99N  Drug level / dose changes info: 9/4: increase vanc from 750mg  q12h to 750mg  q8h for improved renal function  Goal of Therapy:  Vancomycin trough level 15-20 mcg/ml Appropriate antibiotic dosing for renal function; eradication of infection  Plan:   Increase Vancomycin to 750mg  IV Q8H for improved renal function  Continue Zosyn 3.375g IV Q8H infused over 4hrs.  Measure Vanc trough at steady state.  Follow up renal fxn and culture results.  Loma Boston, PharmD Pager: [phone] 03/03/2014 9:38 AM

## 2014-03-03 NOTE — Progress Notes (Signed)
eLink Physician-Brief Progress Note Patient Name: [name] [name] DOB: [birth year] MRN: [id number]   Date of Service  03/03/2014  HPI/Events of Note  Markedly agitated Pulling at tubes lines Propofol and prn fentanyl currently  eICU Interventions  Fentanyl gtt     Intervention Category Minor Interventions: Agitation / anxiety - evaluation and management  [name] [name] 03/03/2014, 7:55 PM

## 2014-03-03 NOTE — Progress Notes (Signed)
  Echocardiogram 2D Echocardiogram has been performed.  Arvil Chaco 03/03/2014, 10:19 AM

## 2014-03-03 NOTE — Progress Notes (Signed)
INITIAL NUTRITION ASSESSMENT  DOCUMENTATION CODES Per approved criteria  -Not Applicable   INTERVENTION: If unable to extubate, recommend TF per OG tube with Vital AF 1.2 at 20 ml/hr, increase 10 ml every 4 hours as tolerated to goal of 50 ml/hr.  This would provide:  1440 kcal, 90 gm protein, 973 ml free water and would meet 100% kcal and protein needs with addition of calories from propofol.    RD to monitor.  NUTRITION DIAGNOSIS: Inadequate oral intae related to inability to eat as evidenced by npo status.   Goal: Based on plan of care.  Patient to meet >90% estimated needs with TF vs oral nutrition.  Monitor:  Plan of care, diet advancement, labs, weight trend.  Reason for Assessment: vent  48 y.o. female  Admitting Dx: <principal problem not specified>  ASSESSMENT: Patient admitted with respiratory/cardiac arrest.  VDRF secondary to BASBZ presumed infectious process.  Hypercarbia.  Intubated.  She is from [place].  One English speaking daughter.    Patient is currently intubated on ventilator support MV: 13.5 L/min Temp (24hrs), Avg:100 F (37.8 C), Min:97.9 F (36.6 C), Max:102 F (38.9 C)  Propofol: 20 ml/hr  9/4:  Family not available.  Spoke with nurse.  Patient requires maximum sedation.  Height: Ht Readings from Last 1 Encounters:  03/02/14 5\' 6"  (1.676 m)    Weight: Wt Readings from Last 1 Encounters:  03/03/14 144 lb 2.9 oz (65.4 kg)    Ideal Body Weight: 130 lbs  % Ideal Body Weight: 111  Wt Readings from Last 10 Encounters:  03/03/14 144 lb 2.9 oz (65.4 kg)    Usual Body Weight: unknown  % Usual Body Weight: unknown  BMI:  Body mass index is 23.28 kg/(m^2).  Estimated Nutritional Needs: Kcal: 1961 Protein: 80-90 gm  Fluid: 1.8-2 L daily  Skin: intact  Diet Order: NPO  EDUCATION NEEDS: -No education needs identified at this time   Intake/Output Summary (Last 24 hours) at 03/03/14 1018 Last data filed at 03/03/14 0900  Gross per  24 hour  Intake   1780 ml  Output   3335 ml  Net  -1555 ml    Last BM: unknown  Labs:   Recent Labs Lab 02/27/14 1124 03/02/14 1002 03/02/14 1235 03/03/14 0349  NA 143 143  --  139  K 3.9 4.3  --  3.9  CL 105 103  --  103  CO2 23 15*  --  25  BUN 15 20  --  13  CREATININE 0.79 1.24*  --  0.80  CALCIUM 9.2 9.4  --  8.4  PHOS  --   --  4.4  --   GLUCOSE 98 300*  --  114*    CBG (last 3)   Recent Labs  03/03/14 0030 03/03/14 0339 03/03/14 0749  GLUCAP 103* 104* 86    Scheduled Meds: . antiseptic oral rinse  7 mL Mouth Rinse QID  . chlorhexidine  15 mL Mouth Rinse BID  . folic acid  1 mg Intravenous Daily  . heparin  5,000 Units Subcutaneous 3 times per day  . insulin aspart  0-15 Units Subcutaneous 6 times per day  . ipratropium-albuterol  3 mL Nebulization Q4H  . pantoprazole (PROTONIX) IV  40 mg Intravenous QHS  . piperacillin-tazobactam (ZOSYN)  IV  3.375 g Intravenous 3 times per day  . thiamine IV  100 mg Intravenous Daily  . vancomycin  750 mg Intravenous Q8H    Continuous Infusions: . sodium  chloride 10 mL/hr at 03/03/14 0330  . propofol 50 mcg/kg/min (03/03/14 1610)    Past Medical History  Diagnosis Date  . Back pain   . Gastritis     Past Surgical History  Procedure Laterality Date  . Cesarean section      Oran Rein, RD, LDN Clinical Inpatient Dietitian Pager:  [phone] Weekend and after hours pager:  [phone]

## 2014-03-03 NOTE — Clinical Documentation Improvement (Signed)
Possible Clinical Conditions? Acute Respiratory Failure Acute on Chronic Respiratory Failure Chronic Respiratory Failure Other Condition Cannot Clinically Determine   Supporting Information: (As per pharmacy notes) "Pharmacy is asked to dose broad-spectrum antibiotics for suspected aspiration pneumonia" Signs & Symptoms: Cardiac Arrest, COPD exacerbation Diagnostics: CT ANGIO CHEST 03/02/14 IMPRESSION: 1. Multi lobar pneumonia with small pleural effusions. The pattern and history of cardiac arrest implicates &aspiration. 2. As permitted by moderate motion degradation, no evidence of pulmonary embolism. 3. Endotracheal tube lower than at time of placement, now ending 1 cm above the carina. 4. Left ventricular enlargement. No definite pulmonary edema. 5. Right upper lobe scarring with air trapping, likely from remote infection.   Treatment: -Vancomycin 1g STAT then 750mg  IV q12h.  -Zosyn 3.375g STAT over , then Zosyn 3.375g IV Q8H infused over 4hrs.   Thank You, Nevin Bloodgood, RN, BSN, CCDS,Clinical Documentation Specialist:  [phone]  [phone]=Cell [place]- Health Information Management

## 2014-03-03 NOTE — Progress Notes (Signed)
CARE MANAGEMENT NOTE 03/03/2014  Patient:  [name],[name]   Account Number:  [id number]  Date Initiated:  03/03/2014  Documentation initiated by:  Alfreda Hammad  Subjective/Objective Assessment:   [name](smoker) who was seen in ED 8/31 with SOB, treated and released. She presented to [facility] ED 9/3 with acute resp distress and subsequently has respiratory/cardiac arrest     Action/Plan:   tbd based on her progress and if she awakes   Anticipated DC Date:  03/06/2014   Anticipated DC Plan:  SKILLED NURSING FACILITY  In-house referral  Clinical Chief Strategy Officer      DC Planning Services  CM consult      [facility] Choice  NA   Choice offered to / List presented to:  NA   DME arranged  NA      DME agency  NA     HH arranged  NA      HH agency  NA   Status of service:  In process, will continue to follow Medicare Important Message given?  NO (If response is "NO", the following Medicare IM given date fields will be blank) Date Medicare IM given:   Medicare IM given by:   Date Additional Medicare IM given:   Additional Medicare IM given by:    Discharge Disposition:    Per UR Regulation:  Reviewed for med. necessity/level of care/duration of stay  If discussed at Long Length of Stay Meetings, dates discussed:    Comments:  Marcelle Smiling, RN,BSN,CCM

## 2014-03-04 ENCOUNTER — Inpatient Hospital Stay (HOSPITAL_COMMUNITY): Payer: Self-pay

## 2014-03-04 DIAGNOSIS — J9600 Acute respiratory failure, unspecified whether with hypoxia or hypercapnia: Secondary | ICD-10-CM

## 2014-03-04 DIAGNOSIS — J189 Pneumonia, unspecified organism: Principal | ICD-10-CM

## 2014-03-04 DIAGNOSIS — J9589 Other postprocedural complications and disorders of respiratory system, not elsewhere classified: Secondary | ICD-10-CM

## 2014-03-04 LAB — BLOOD GAS, ARTERIAL
ACID-BASE DEFICIT: 3.9 mmol/L — AB (ref 0.0–2.0)
Acid-base deficit: 0.5 mmol/L (ref 0.0–2.0)
Acid-base deficit: 5 mmol/L — ABNORMAL HIGH (ref 0.0–2.0)
BICARBONATE: 22.2 meq/L (ref 20.0–24.0)
Bicarbonate: 21.7 mEq/L (ref 20.0–24.0)
Bicarbonate: 21.3 mEq/L (ref 20.0–24.0)
DRAWN BY: 270211
Drawn by: 235321
Drawn by: 270211
FIO2: 0.6 %
FIO2: 0.9 %
FIO2: 0.8 %
LHR: 32 {breaths}/min
O2 SAT: 92 %
O2 SAT: 94.9 %
O2 Saturation: 99.5 %
PATIENT TEMPERATURE: 38.1
PATIENT TEMPERATURE: 37
PEEP/CPAP: 8 cmH2O
PEEP/CPAP: 14 cmH2O
PEEP: 14 cmH2O
Patient temperature: 37
RATE: 14 resp/min
RATE: 22 resp/min
TCO2: 20.3 mmol/L (ref 0–100)
TCO2: 20.8 mmol/L (ref 0–100)
TCO2: 20.1 mmol/L (ref 0–100)
VT: 500 mL
VT: 420 mL
VT: 360 mL
pCO2 arterial: 33.1 mmHg — ABNORMAL LOW (ref 35.0–45.0)
pCO2 arterial: 50.7 mmHg — ABNORMAL HIGH (ref 35.0–45.0)
pCO2 arterial: 42.3 mmHg (ref 35.0–45.0)
pH, Arterial: 7.448 (ref 7.350–7.450)
pH, Arterial: 7.253 — ABNORMAL LOW (ref 7.350–7.450)
pH, Arterial: 7.323 — ABNORMAL LOW (ref 7.350–7.450)
pO2, Arterial: 63.6 mmHg — ABNORMAL LOW (ref 80.0–100.0)
pO2, Arterial: 86.5 mmHg (ref 80.0–100.0)
pO2, Arterial: 253 mmHg — ABNORMAL HIGH (ref 80.0–100.0)

## 2014-03-04 LAB — GLUCOSE, CAPILLARY
GLUCOSE-CAPILLARY: 126 mg/dL — AB (ref 70–99)
GLUCOSE-CAPILLARY: 92 mg/dL (ref 70–99)
Glucose-Capillary: 63 mg/dL — ABNORMAL LOW (ref 70–99)
Glucose-Capillary: 79 mg/dL (ref 70–99)
Glucose-Capillary: 66 mg/dL — ABNORMAL LOW (ref 70–99)
Glucose-Capillary: 123 mg/dL — ABNORMAL HIGH (ref 70–99)
Glucose-Capillary: 141 mg/dL — ABNORMAL HIGH (ref 70–99)
Glucose-Capillary: 104 mg/dL — ABNORMAL HIGH (ref 70–99)

## 2014-03-04 LAB — PHOSPHORUS: Phosphorus: 2 mg/dL — ABNORMAL LOW (ref 2.3–4.6)

## 2014-03-04 LAB — CBC
HCT: 30 % — ABNORMAL LOW (ref 36.0–46.0)
Hemoglobin: 10 g/dL — ABNORMAL LOW (ref 12.0–15.0)
MCH: 30.6 pg (ref 26.0–34.0)
MCHC: 33.3 g/dL (ref 30.0–36.0)
MCV: 91.7 fL (ref 78.0–100.0)
Platelets: 223 10*3/uL (ref 150–400)
RBC: 3.27 MIL/uL — AB (ref 3.87–5.11)
RDW: 13.8 % (ref 11.5–15.5)
WBC: 13.5 10*3/uL — AB (ref 4.0–10.5)

## 2014-03-04 LAB — MAGNESIUM: Magnesium: 1.9 mg/dL (ref 1.5–2.5)

## 2014-03-04 MED ORDER — DEXTROSE 5 % IV SOLN
500.0000 mg | INTRAVENOUS | Status: DC
  Administered 2014-03-04 – 2014-03-08 (×5): 500 mg via INTRAVENOUS
  Filled 2014-03-04 (×5): qty 500

## 2014-03-04 MED ORDER — DEXTROSE 50 % IV SOLN
INTRAVENOUS | Status: AC
  Administered 2014-03-04: 50 mL
  Filled 2014-03-04: qty 50

## 2014-03-04 MED ORDER — DEXTROSE 5 % IV SOLN
2.0000 ug/min | INTRAVENOUS | Status: DC
  Administered 2014-03-04: 15 ug/min via INTRAVENOUS
  Administered 2014-03-04: 8 ug/min via INTRAVENOUS
  Administered 2014-03-04: 10 ug/min via INTRAVENOUS
  Administered 2014-03-04: 5 ug/min via INTRAVENOUS
  Administered 2014-03-05: 20 ug/min via INTRAVENOUS
  Administered 2014-03-05: 10 ug/min via INTRAVENOUS
  Administered 2014-03-05: 7 ug/min via INTRAVENOUS
  Administered 2014-03-06: 5 ug/min via INTRAVENOUS
  Filled 2014-03-04 (×7): qty 4

## 2014-03-04 MED ORDER — DEXTROSE 50 % IV SOLN
25.0000 mL | Freq: Once | INTRAVENOUS | Status: AC | PRN
  Administered 2014-03-04: 25 mL via INTRAVENOUS
  Filled 2014-03-04: qty 50

## 2014-03-04 NOTE — Progress Notes (Signed)
During the evening pt has been agitated and required more continuous sedation ordered by elink.  Sats have been 88-91 on 60% with resp rate around 36. Elink and resp aware with changes made as needed.

## 2014-03-04 NOTE — Progress Notes (Signed)
Hypoglycemic Event  CBG: 66  Treatment: D50 IV 25 mL  Symptoms: None - Patient intubated/sedated  Follow-up CBG: Time:08:30 CBG Result:123  Possible Reasons for Event: Patient not on tube feeds or IV dextrose  Comments/MD notified:Will notify rounding MD    Arlyss Repress, Mitchell Heir  Remember to initiate Hypoglycemia Order Set & complete

## 2014-03-04 NOTE — Progress Notes (Signed)
Patient's family reports that patient has a history of alcohol abuse and severe agitation may be related to this. They are unsure of last time patient consumed alcohol.

## 2014-03-04 NOTE — Progress Notes (Signed)
PULMONARY / CRITICAL CARE MEDICINE   Name: [name] [name] MRN: [id number] DOB: 02-Nov-1965    ADMISSION DATE:  03/02/2014  STUDIES:  CT-PA 9/3 >> no large PE (exam limited), B LAD, B LL predominant infiltrates and consolidation, small B effusions TTE 9/4/ >> regional LV hypokinesis and dilation, EF 30-35%, dilated LA,   SIGNIFICANT EVENTS: 9/3 resp->cardiac arrest 9/3 intubated  9/3 family at bedside. She is from [place] and is fighting with her husband. One english speaking daughter. Parents are POA per daughter.   HISTORY OF PRESENT ILLNESS:   [name](smoker) who was seen in ED 8/31 with SOB, treated and released. She presented to [facility] ED 9/3 with B infiltrates, acute resp distress and subsequently has respiratory/cardiac arrest with one round of epi/cpr/intubation. She was acidotic but improved with ventilatory support. She will be admitted to ICU, may ned CT of head if she does not wake up.  SUBJECTIVE:  Agitated with decrease in sedation 9/5 Desaturation with suctioning and activity, PEEP increased to 8 Relative hypotension w sedation  VITAL SIGNS: Temp:  [99.3 F (37.4 C)-101.5 F (38.6 C)] 100.6 F (38.1 C) (09/05 1100) Pulse Rate:  [91-122] 109 (09/05 1100) Resp:  [14-38] 25 (09/05 1100) BP: (75-115)/(48-65) 75/48 mmHg (09/05 0840) SpO2:  [87 %-98 %] 96 % (09/05 1125) Arterial Line BP: (87-136)/(42-81) 90/49 mmHg (09/05 1100) FiO2 (%):  [40 %-100 %] 100 % (09/05 1126) Weight:  [65.1 kg (143 lb 8.3 oz)] 65.1 kg (143 lb 8.3 oz) (09/05 0400) HEMODYNAMICS: CVP:  [12 mmHg] 12 mmHg VENTILATOR SETTINGS: Vent Mode:  [-] PRVC FiO2 (%):  [40 %-100 %] 100 % Set Rate:  [14 bmp] 14 bmp Vt Set:  [500 mL] 500 mL PEEP:  [5 cmH20-8 cmH20] 8 cmH20 Plateau Pressure:  [26 cmH20-33 cmH20] 33 cmH20 INTAKE / OUTPUT:  Intake/Output Summary (Last 24 hours) at 03/04/14 1144 Last data filed at 03/04/14 1100  Gross per 24 hour  Intake 2419.5 ml  Output   3325 ml  Net -905.5 ml     PHYSICAL EXAMINATION: General:  WNWD Hispanic female,sedated on vent with diprivan Neuro:  Sedated or agitated depending on stimulation, maex 4 spont. HEENT:  No JVD/LAN, ott->vent OGT->suction Cardiovascular:  HSR RRR Lungs:  Coarse rhonchi bilaterally, no wheeze Abdomen:  Soft,+bs Musculoskeletal:  Intact Skin:  Warm and dry  LABS:  CBC  Recent Labs Lab 03/02/14 1002 03/03/14 0349 03/04/14 0400  WBC 18.2* 12.4* 13.5*  HGB 12.3 10.1* 10.0*  HCT 39.5 30.2* 30.0*  PLT 313 220 223   Coag's  Recent Labs Lab 03/02/14 1235  APTT 24  INR 1.08   BMET  Recent Labs Lab 02/27/14 1124 03/02/14 1002 03/03/14 0349  NA 143 143 139  K 3.9 4.3 3.9  CL 105 103 103  CO2 23 15* 25  BUN 15 20 13   CREATININE 0.79 1.24* 0.80  GLUCOSE 98 300* 114*   Electrolytes  Recent Labs Lab 02/27/14 1124 03/02/14 1002 03/02/14 1235 03/03/14 0349 03/04/14 0400  CALCIUM 9.2 9.4  --  8.4  --   MG  --   --   --   --  1.9  PHOS  --   --  4.4  --  2.0*   Sepsis Markers  Recent Labs Lab 03/02/14 1012 03/02/14 1235 03/03/14 0950  LATICACIDVEN 10.62* 2.3* 0.7  PROCALCITON  --  0.17  --    ABG  Recent Labs Lab 03/03/14 0345 03/03/14 2320 03/04/14 0305  PHART 7.375 7.384  7.448  PCO2ART 44.1 40.6 33.1*  PO2ART 80.2 59.7* 63.6*   Liver Enzymes No results found for this basename: AST, ALT, ALKPHOS, BILITOT, ALBUMIN,  in the last 168 hours Cardiac Enzymes  Recent Labs Lab 03/02/14 1002  PROBNP 1799.0*   Glucose  Recent Labs Lab 03/03/14 2036 03/04/14 0031 03/04/14 0137 03/04/14 0407 03/04/14 0803 03/04/14 0831  GLUCAP 70 63* 126* 79 66* 123*    Imaging Dg Chest Port 1 View  03/03/2014   CLINICAL DATA:  Endotracheal tube patient, tube placed yesterday, evaluate lungs  EXAM: PORTABLE CHEST - 1 VIEW  COMPARISON:  03/02/2014  FINDINGS: No change in position of endotracheal tube with tip 1 cm above the carina. Left subclavian central line and NG tube unchanged.   Extensive airspace opacity over the right middle and lower lung zones, stable allowing for differences in technique. Diffuse mixed alveolar and interstitial opacity throughout the left lung, most consolidated in the lower lobe, also stable.  IMPRESSION: No change when compared to radiograph performed yesterday.   Electronically Signed   By: Esperanza Heir M.D.   On: 03/03/2014 09:20     ASSESSMENT / PLAN:  PULMONARY OETT 9/3>> A: Acute resp failure Multilobar, LL predominant CAP ARDS Posterior scarring, ? Prior infectious process. TB considered, initial AFB negative P:   Start ARDS protocol, increase PEEP as needed to better oxygenate Sedation to tolerate vent strategy If a second AFB is negative then will d/c respiratory isolation  CARDIOVASCULAR CVL 9/3 l i j cvl>> Aline 9/3>> A:  Post cardiac arrest secondary resp arrest Shock, likely septic shock from PNA, consider superimposed cardiogenic component (EF 35%) P:  Start norepi 9/5 CVP monitoring, goal 7-8 Abx as below  RENAL A:   Acute renal insufficiency post-arrest, resolved P:   Follow BMP  GASTROINTESTINAL A:   GI protection P:   PPI  HEMATOLOGIC A:   No acute issue P:  Follow CBC  INFECTIOUS A:   Presumed CAP Area of superior scarring vs nodularity raises possibility of Tb P:   BCx2  9/3>> UC  9/3>> Sputum 9/3>> Quan gold 9/4>> Sputum AFB 9/3 >> negative Sputum AFB 9/4 >>   Abx: vanc, start date 9/3, day 3/x Abx: pip/tazo, start date 9/3, day 3/x Abx: azithro, start date 9/5, day 1/x   ENDOCRINE A:   Hyperglycemia P:   SSI per protocol  NEUROLOGIC A:   Sedation P:   RASS goal: -2 Changed RASS goal to -2 to allow low Vt vent strategy  TODAY'S SUMMARY:  VDRF and ARDS, septic shock due to multilobar CAP. Sedation needs increased to tolerate low Vt's. Pressors started 9/5   CC time 50 minutes  Levy Pupa, MD, PhD 03/04/2014, 12:07 PM [facility] [phone]  or if no answer [phone]

## 2014-03-05 DIAGNOSIS — J189 Pneumonia, unspecified organism: Secondary | ICD-10-CM | POA: Diagnosis present

## 2014-03-05 DIAGNOSIS — J80 Acute respiratory distress syndrome: Secondary | ICD-10-CM | POA: Diagnosis not present

## 2014-03-05 LAB — GLUCOSE, CAPILLARY
GLUCOSE-CAPILLARY: 118 mg/dL — AB (ref 70–99)
GLUCOSE-CAPILLARY: 85 mg/dL (ref 70–99)
Glucose-Capillary: 101 mg/dL — ABNORMAL HIGH (ref 70–99)
Glucose-Capillary: 139 mg/dL — ABNORMAL HIGH (ref 70–99)
Glucose-Capillary: 68 mg/dL — ABNORMAL LOW (ref 70–99)
Glucose-Capillary: 135 mg/dL — ABNORMAL HIGH (ref 70–99)
Glucose-Capillary: 83 mg/dL (ref 70–99)

## 2014-03-05 LAB — TRIGLYCERIDES: TRIGLYCERIDES: 229 mg/dL — AB (ref <150)

## 2014-03-05 LAB — CBC
HCT: 28.8 % — ABNORMAL LOW (ref 36.0–46.0)
HEMOGLOBIN: 9.5 g/dL — AB (ref 12.0–15.0)
MCH: 30.3 pg (ref 26.0–34.0)
MCHC: 33 g/dL (ref 30.0–36.0)
MCV: 91.7 fL (ref 78.0–100.0)
PLATELETS: 267 10*3/uL (ref 150–400)
RBC: 3.14 MIL/uL — ABNORMAL LOW (ref 3.87–5.11)
RDW: 13.9 % (ref 11.5–15.5)
WBC: 13.4 10*3/uL — ABNORMAL HIGH (ref 4.0–10.5)

## 2014-03-05 LAB — BASIC METABOLIC PANEL
ANION GAP: 14 (ref 5–15)
BUN: 9 mg/dL (ref 6–23)
CO2: 20 mEq/L (ref 19–32)
Calcium: 8.5 mg/dL (ref 8.4–10.5)
Chloride: 107 mEq/L (ref 96–112)
Creatinine, Ser: 0.71 mg/dL (ref 0.50–1.10)
GFR calc non Af Amer: >90 mL/min (ref >90)
Glucose, Bld: 99 mg/dL (ref 70–99)
Potassium: 3.9 mEq/L (ref 3.7–5.3)
Sodium: 141 mEq/L (ref 137–147)

## 2014-03-05 LAB — VANCOMYCIN, TROUGH: VANCOMYCIN TR: 17.2 ug/mL (ref 10.0–20.0)

## 2014-03-05 MED ORDER — LORAZEPAM 2 MG/ML IJ SOLN
INTRAMUSCULAR | Status: AC
  Filled 2014-03-05: qty 2

## 2014-03-05 MED ORDER — FENTANYL CITRATE 0.05 MG/ML IJ SOLN
25.0000 ug | INTRAMUSCULAR | Status: DC | PRN
  Administered 2014-03-08 – 2014-03-10 (×3): 100 ug via INTRAVENOUS

## 2014-03-05 MED ORDER — SODIUM CHLORIDE 0.9 % IV SOLN
0.0000 mg/h | INTRAVENOUS | Status: DC
  Administered 2014-03-05: 2 mg/h via INTRAVENOUS
  Administered 2014-03-06: 1 mg/h via INTRAVENOUS
  Administered 2014-03-06 – 2014-03-07 (×2): 2 mg/h via INTRAVENOUS
  Administered 2014-03-07: 4 mg/h via INTRAVENOUS
  Administered 2014-03-08: 1 mg/h via INTRAVENOUS
  Filled 2014-03-05 (×6): qty 10

## 2014-03-05 MED ORDER — DEXTROSE 50 % IV SOLN
INTRAVENOUS | Status: AC
  Filled 2014-03-05: qty 50

## 2014-03-05 MED ORDER — LORAZEPAM 2 MG/ML IJ SOLN
2.0000 mg | Freq: Four times a day (QID) | INTRAMUSCULAR | Status: DC
  Administered 2014-03-05: 2 mg via INTRAVENOUS
  Filled 2014-03-05: qty 1

## 2014-03-05 MED ORDER — DEXTROSE 50 % IV SOLN
25.0000 mL | Freq: Once | INTRAVENOUS | Status: AC | PRN
  Administered 2014-03-05: 25 mL via INTRAVENOUS

## 2014-03-05 MED ORDER — LORAZEPAM 2 MG/ML IJ SOLN
4.0000 mg | Freq: Once | INTRAMUSCULAR | Status: AC
  Administered 2014-03-05: 4 mg via INTRAVENOUS

## 2014-03-05 MED ORDER — MIDAZOLAM BOLUS VIA INFUSION
1.0000 mg | INTRAVENOUS | Status: DC | PRN
  Administered 2014-03-08: 2 mg via INTRAVENOUS
  Filled 2014-03-05: qty 2

## 2014-03-05 NOTE — Progress Notes (Signed)
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 I also spoke with Dr. Craige Cotta in [facility] about the results. Both agree to leave the patient off airborne isolation at this time since AFB negative X3.   [name] [name]

## 2014-03-05 NOTE — Progress Notes (Signed)
eLink Physician-Brief Progress Note Patient Name: [name] [name] DOB: 16-Dec-1965 MRN: [id number]   Date of Service  03/05/2014  HPI/Events of Note  Pt with ARDS on MV.  She has hx of ETOH.  She has been on diprivan, but triglyceride level elevated.  eICU Interventions  Will change to versed/fentanyl gtt with goal RASS -2.     Intervention Category Major Interventions: Other:  Ahsley Attwood 03/05/2014, 7:42 PM

## 2014-03-05 NOTE — Progress Notes (Signed)
Patient has had 3 negative AFB tests as of 03/05/14 @ 0730.  Called infection prevention and she approved discontinuation of Airborne Precautions.

## 2014-03-05 NOTE — Progress Notes (Signed)
ANTIBIOTIC CONSULT NOTE -  Pharmacy Consult for Vancomycin and Zosyn Indication: Aspiration pneumonia  No Known Allergies  Patient Measurements: Height:  (167.6 cm) Weight: 142 lb 3.2 oz (64.5 kg) IBW/kg (Calculated) : 59.3  Vital Signs: Temp: 97.7 F (36.5 C) (09/06 1200) Temp src: Core (Comment) (09/06 0800) BP: 94/54 mmHg (09/06 0800) Pulse Rate: 92 (09/06 1200) Intake/Output from previous day: 09/05 0701 - 09/06 0700 In: 3325 [I.V.:2457.5; NG/GT:30; IV Piggyback:837.5] Out: 2475 [Urine:2275; Emesis/NG output:200] Intake/Output from this shift: Total I/O In: 444.7 [I.V.:444.7] Out: 550 [Urine:550]  Labs:  Recent Labs  03/03/14 0349 03/04/14 0400 03/05/14 0412  WBC 12.4* 13.5* 13.4*  HGB 10.1* 10.0* 9.5*  PLT 220 223 267  CREATININE 0.80  --  0.71   Estimated Creatinine Clearance: 82.3 ml/min (by C-G formula based on Cr of 0.71).  Recent Labs  03/05/14 1128  VANCOTROUGH 17.2     Microbiology: Recent Results (from the past 720 hour(s))  CULTURE, BLOOD (ROUTINE X 2)     Status: None   Collection Time    03/02/14 12:06 PM      Result Value Ref Range Status   Specimen Description BLOOD A LINE   Final   Special Requests BOTTLES DRAWN AEROBIC AND ANAEROBIC 6 CC   Final   Culture  Setup Time     Final   Value: 03/02/2014 19:57     Performed at [facility]   Culture     Final   Value:        BLOOD CULTURE RECEIVED NO GROWTH TO DATE CULTURE WILL BE HELD FOR 5 DAYS BEFORE ISSUING A FINAL NEGATIVE REPORT     Performed at [facility]   Report Status PENDING   Incomplete  CULTURE, BLOOD (ROUTINE X 2)     Status: None   Collection Time    03/02/14 12:11 PM      Result Value Ref Range Status   Specimen Description BLOOD CENTRAL LINE   Final   Special Requests BOTTLES DRAWN AEROBIC AND ANAEROBIC 6 CC   Final   Culture  Setup Time     Final   Value: 03/02/2014 19:58     Performed at [facility]   Culture     Final   Value:         BLOOD CULTURE RECEIVED NO GROWTH TO DATE CULTURE WILL BE HELD FOR 5 DAYS BEFORE ISSUING A FINAL NEGATIVE REPORT     Performed at [facility]   Report Status PENDING   Incomplete  URINE CULTURE     Status: None   Collection Time    03/02/14 12:22 PM      Result Value Ref Range Status   Specimen Description URINE, CATHETERIZED   Final   Special Requests Normal   Final   Culture  Setup Time     Final   Value: 03/02/2014 19:49     Performed at [facility] Count     Final   Value: NO GROWTH     Performed at [facility]   Culture     Final   Value: NO GROWTH     Performed at [facility]   Report Status 03/04/2014 FINAL   Final  MRSA PCR SCREENING     Status: None   Collection Time    03/02/14  3:12 PM      Result Value Ref Range Status   MRSA by PCR NEGATIVE  NEGATIVE Final   Comment:            The GeneXpert MRSA Assay (FDA     approved for NASAL specimens     only), is one component of a     comprehensive MRSA colonization     surveillance program. It is not     intended to diagnose MRSA     infection nor to guide or     monitor treatment for     MRSA infections.  CULTURE, RESPIRATORY (NON-EXPECTORATED)     Status: None   Collection Time    03/02/14 11:24 PM      Result Value Ref Range Status   Specimen Description ENDOTRACHEAL   Final   Special Requests Normal   Final   Gram Stain     Final   Value: ABUNDANT WBC PRESENT,BOTH PMN AND MONONUCLEAR     RARE SQUAMOUS EPITHELIAL CELLS PRESENT     NO ORGANISMS SEEN     Performed at [facility]   Culture     Final   Value: Non-Pathogenic Oropharyngeal-type Flora Isolated.     Performed at [facility]   Report Status 03/05/2014 FINAL   Final  AFB CULTURE WITH SMEAR     Status: None   Collection Time    03/02/14 11:24 PM      Result Value Ref Range Status   Specimen Description ENDOTRACHEAL   Final   Special Requests Normal   Final   Acid Fast Smear     Final    Value: NO ACID FAST BACILLI SEEN     Performed at [facility]   Culture     Final   Value: CULTURE WILL BE EXAMINED FOR 6 WEEKS BEFORE ISSUING A FINAL REPORT     Performed at [facility]   Report Status PENDING   Incomplete  AFB CULTURE WITH SMEAR     Status: None   Collection Time    03/03/14 10:35 AM      Result Value Ref Range Status   Specimen Description ENDOTRACHEAL   Final   Special Requests NONE   Final   Acid Fast Smear     Final   Value: NO ACID FAST BACILLI SEEN     Performed at [facility]   Culture     Final   Value: CULTURE WILL BE EXAMINED FOR 6 WEEKS BEFORE ISSUING A FINAL REPORT     Performed at [facility]   Report Status PENDING   Incomplete  AFB CULTURE WITH SMEAR     Status: None   Collection Time    03/03/14  7:53 PM      Result Value Ref Range Status   Specimen Description ENDOTRACHEAL   Final   Special Requests NONE   Final   Acid Fast Smear     Final   Value: NO ACID FAST BACILLI SEEN     Performed at [facility]   Culture     Final   Value: CULTURE WILL BE EXAMINED FOR 6 WEEKS BEFORE ISSUING A FINAL REPORT     Performed at [facility]   Report Status PENDING   Incomplete    Medical History: Past Medical History  Diagnosis Date  . Back pain   . Gastritis     Medications:  Prescriptions prior to admission  Medication Sig Dispense Refill  . fexofenadine (ALLEGRA) 60 MG tablet Take 1 tablet (60 mg total) by mouth  2 (two) times daily.  20 tablet  0   Assessment: 48yo F with resp distress, experienced cardiac arrest in the ED, ROSC was established and she was placed on the ventilator. CTA negative for PE. Recent admission with SOB, enlarged lymph nodes and abnormal lung findings. Ongoing tobacco use. Pharmacy is asked to dose broad-spectrum antibiotics for suspected aspiration pneumonia.   9/3 >> Zosyn >> 9/3 >> Vanc >>  9/5 >> Azithro >>  Tmax: afebrile WBCs: elevated Renal: improved SCr  0.71, CrCl 90CG, 99N  9/3: AFB: no AFB. pending x 6 weeks 9/3: blood: ngtd 9/3: blood: ngtd 9/3: respiratory: pending 9/3: urine: ng F  Drug level / dose changes info: 9/4: increase vanc from  q12h to  q8h for improved renal function 9/6 1130 VT: 17.2 on vanc  q8h (therapeutic)  Goal of Therapy:  Vancomycin trough level 15-20 mcg/ml Appropriate antibiotic dosing for renal function; eradication of infection  Plan:   Continue Vancomycin  IV Q8H   Continue Zosyn 3.375g IV Q8H infused over 4hrs.  Follow up renal fxn and culture results.  Loma Boston, PharmD Pager: [phone] 03/05/2014 12:44 PM

## 2014-03-05 NOTE — Progress Notes (Signed)
Hypoglycemia event  CBG 68, Gave 1/2 amp D50, rechecked CBG 15 mins later, CBG 139  Dub Amis M

## 2014-03-05 NOTE — Progress Notes (Signed)
PULMONARY / CRITICAL CARE MEDICINE   Name: Kathrynn Backstrom MRN: [id number] DOB: November 29, 1965    ADMISSION DATE:  03/02/2014  STUDIES:  CT-PA 9/3 >> no large PE (exam limited), B LAD, B LL predominant infiltrates and consolidation, small B effusions TTE 9/4/ >> regional LV hypokinesis and dilation, EF 30-35%, dilated LA,   SIGNIFICANT EVENTS: 9/3 resp->cardiac arrest 9/3 intubated  9/3 family at bedside. She is from [place] and is fighting with her husband. One english speaking daughter. Parents are POA per daughter.   HISTORY OF PRESENT ILLNESS:   [name](smoker) who was seen in ED 8/31 with SOB, treated and released. She presented to [facility] ED 9/3 with B infiltrates, acute resp distress and subsequently has respiratory/cardiac arrest with one round of epi/cpr/intubation. She was acidotic but improved with ventilatory support.   SUBJECTIVE:  Changed to ARDS protocol 9/5 Agitated even on sedation  VITAL SIGNS: Temp:  [97.2 F (36.2 C)-100.6 F (38.1 C)] 98.1 F (36.7 C) (09/06 0924) Pulse Rate:  [81-121] 120 (09/06 0924) Resp:  [15-32] 30 (09/06 0924) BP: (71-111)/(43-69) 94/54 mmHg (09/06 0800) SpO2:  [85 %-100 %] 100 % (09/06 0924) Arterial Line BP: (71-154)/(37-114) 93/45 mmHg (09/06 0924) FiO2 (%):  [50 %-100 %] 50 % (09/06 0800) Weight:  [64.5 kg (142 lb 3.2 oz)] 64.5 kg (142 lb 3.2 oz) (09/06 0515) HEMODYNAMICS: CVP:  [8 mmHg-10 mmHg] 8 mmHg VENTILATOR SETTINGS: Vent Mode:  [-] PRVC FiO2 (%):  [50 %-100 %] 50 % Set Rate:  [14 bmp-32 bmp] 32 bmp Vt Set:  [360 mL-500 mL] 360 mL PEEP:  [8 cmH20-14 cmH20] 8 cmH20 Plateau Pressure:  [25 cmH20-33 cmH20] 25 cmH20 INTAKE / OUTPUT:  Intake/Output Summary (Last 24 hours) at 03/05/14 0941 Last data filed at 03/05/14 0830  Gross per 24 hour  Intake 3276.8 ml  Output   2725 ml  Net  551.8 ml    PHYSICAL EXAMINATION: General:  WNWD Hispanic female, MV Neuro:  agitated, maex 4 spont, pulling at vent and lines HEENT:   No JVD/LAN, ott->vent OGT->suction Cardiovascular:  HSR RRR Lungs:  Coarse rhonchi bilaterally, no wheeze Abdomen:  Soft,+bs Musculoskeletal:  Intact Skin:  Warm and dry  LABS:  CBC  Recent Labs Lab 03/03/14 0349 03/04/14 0400 03/05/14 0412  WBC 12.4* 13.5* 13.4*  HGB 10.1* 10.0* 9.5*  HCT 30.2* 30.0* 28.8*  PLT 220 223 267   Coag's  Recent Labs Lab 03/02/14 1235  APTT 24  INR 1.08   BMET  Recent Labs Lab 03/02/14 1002 03/03/14 0349 03/05/14 0412  NA 143 139 141  K 4.3 3.9 3.9  CL 103 103 107  CO2 15* 25 20  BUN CREATININE 1.24* 0.80 0.71  GLUCOSE 300* 114* 99   Electrolytes  Recent Labs Lab 03/02/14 1002 03/02/14 1235 03/03/14 0349 03/04/14 0400 03/05/14 0412  CALCIUM 9.4  --  8.4  --  8.5  MG  --   --   --  1.9  --   PHOS  --  4.4  --  2.0*  --    Sepsis Markers  Recent Labs Lab 03/02/14 1012 03/02/14 1235 03/03/14 0950  LATICACIDVEN 10.62* 2.3* 0.7  PROCALCITON  --  0.17  --    ABG  Recent Labs Lab 03/04/14 0305 03/04/14 1238 03/04/14 1400  PHART 7.448 7.253* 7.323*  PCO2ART 33.1* 50.7* 42.3  PO2ART 63.6* 86.5 253.0*   Liver Enzymes No results found for this basename: AST, ALT, ALKPHOS, BILITOT,  ALBUMIN,  in the last 168 hours Cardiac Enzymes  Recent Labs Lab 03/02/14 1002  PROBNP 1799.0*   Glucose  Recent Labs Lab 03/04/14 0803 03/04/14 0831 03/04/14 1241 03/04/14 1553 03/04/14 2029 03/04/14 2320  GLUCAP 66* 123* 92 141* 104* 118*    Imaging Dg Chest Port 1 View  03/04/2014   CLINICAL DATA:  ETT placement  EXAM: PORTABLE CHEST - 1 VIEW  COMPARISON:  03/03/2014  FINDINGS: Endotracheal tube terminates 7 mm above the carina.  Multifocal patchy opacities in the lungs bilaterally, worrisome for multifocal pneumonia, less likely moderate interstitial edema. Small bilateral pleural effusions. No pneumothorax.  The heart is top-normal in size.  Left IJ venous catheter terminates at the cavoatrial junction.  Enteric tube courses below the diaphragm.  IMPRESSION: Endotracheal tube terminates 7 mm above the carina.  Multifocal patchy opacities in the lungs bilaterally, worrisome for multifocal pneumonia.  Small bilateral pleural effusions.   Electronically Signed   By: Charline Bills M.D.   On: 03/04/2014 07:11     ASSESSMENT / PLAN:  PULMONARY OETT 9/3>> A: Acute resp failure Multilobar, LL predominant CAP ARDS Posterior scarring, ? Prior infectious process. TB considered, AFB now negative x3 P:   Start ARDS protocol, increase PEEP as needed to better oxygenate Sedation to tolerate vent strategy D/c resp isolation  CARDIOVASCULAR CVL 9/3 l i j cvl>> Aline 9/3>> A:  Post cardiac arrest secondary resp arrest Shock, likely septic shock from PNA, consider superimposed cardiogenic component (EF 35%) P:  Titrate norepip CVP monitoring, goal 7-8 Abx as below  RENAL A:   Acute renal insufficiency post-arrest, resolved P:   Follow BMP  GASTROINTESTINAL A:   GI protection P:   PPI  HEMATOLOGIC A:   No acute issue P:  Follow CBC  INFECTIOUS A:   Presumed CAP Area of superior scarring vs nodularity raises possibility of Tb, AFB negative P:   BCx2  9/3>> UC  9/3>> Sputum 9/3>> Quan gold 9/4>> Sputum AFB 9/3 >> negative Sputum AFB 9/4 >> negative Sputum AFB 9/4 >> negative  Abx: vanc, start date 9/3, day 4/x Abx: pip/tazo, start date 9/3, day 4/x Abx: azithro, start date 9/5, day 2/x   ENDOCRINE A:   Hyperglycemia P:   SSI per protocol  NEUROLOGIC A:   Sedation P:   RASS goal: -2 Changed RASS goal to -2 to allow low Vt vent strategy Add scheduled ativan given heavy EtOH hx  TODAY'S SUMMARY:  VDRF and ARDS, septic shock due to multilobar CAP. Sedation needs increased to tolerate low Vt's. Pressors started 9/5   CC time 40 minutes  Levy Pupa, MD, PhD 03/05/2014, 9:41 AM [facility] [phone] or if no answer [phone]

## 2014-03-06 ENCOUNTER — Inpatient Hospital Stay (HOSPITAL_COMMUNITY): Payer: Self-pay

## 2014-03-06 DIAGNOSIS — E872 Acidosis, unspecified: Secondary | ICD-10-CM

## 2014-03-06 DIAGNOSIS — R092 Respiratory arrest: Secondary | ICD-10-CM

## 2014-03-06 LAB — BASIC METABOLIC PANEL
Anion gap: 13 (ref 5–15)
BUN: 7 mg/dL (ref 6–23)
CALCIUM: 8.6 mg/dL (ref 8.4–10.5)
CHLORIDE: 106 meq/L (ref 96–112)
CO2: 23 mEq/L (ref 19–32)
Creatinine, Ser: 0.68 mg/dL (ref 0.50–1.10)
GFR calc Af Amer: >90 mL/min (ref >90)
GFR calc non Af Amer: >90 mL/min (ref >90)
GLUCOSE: 86 mg/dL (ref 70–99)
Potassium: 3.8 mEq/L (ref 3.7–5.3)
SODIUM: 142 meq/L (ref 137–147)

## 2014-03-06 LAB — CBC
HCT: 28.8 % — ABNORMAL LOW (ref 36.0–46.0)
HEMOGLOBIN: 9.3 g/dL — AB (ref 12.0–15.0)
MCH: 30 pg (ref 26.0–34.0)
MCHC: 32.3 g/dL (ref 30.0–36.0)
MCV: 92.9 fL (ref 78.0–100.0)
PLATELETS: 298 10*3/uL (ref 150–400)
RBC: 3.1 MIL/uL — AB (ref 3.87–5.11)
RDW: 14 % (ref 11.5–15.5)
WBC: 10.6 10*3/uL — ABNORMAL HIGH (ref 4.0–10.5)

## 2014-03-06 LAB — GLUCOSE, CAPILLARY
GLUCOSE-CAPILLARY: 81 mg/dL (ref 70–99)
GLUCOSE-CAPILLARY: 88 mg/dL (ref 70–99)
GLUCOSE-CAPILLARY: 91 mg/dL (ref 70–99)
Glucose-Capillary: 74 mg/dL (ref 70–99)
Glucose-Capillary: 90 mg/dL (ref 70–99)
Glucose-Capillary: 82 mg/dL (ref 70–99)

## 2014-03-06 LAB — MAGNESIUM: Magnesium: 1.9 mg/dL (ref 1.5–2.5)

## 2014-03-06 LAB — PHOSPHORUS: Phosphorus: 3.5 mg/dL (ref 2.3–4.6)

## 2014-03-06 MED ORDER — PRO-STAT SUGAR FREE PO LIQD
30.0000 mL | Freq: Two times a day (BID) | ORAL | Status: DC
  Administered 2014-03-06 – 2014-03-11 (×11): 30 mL
  Filled 2014-03-06 (×22): qty 30

## 2014-03-06 MED ORDER — OXEPA PO LIQD
1000.0000 mL | ORAL | Status: DC
  Administered 2014-03-06: 1000 mL
  Filled 2014-03-06 (×2): qty 1000

## 2014-03-06 NOTE — Plan of Care (Signed)
Problem: Consults Goal: Ventilated Patients Patient Education See Patient Education Module for education specifics.  Outcome: Not Progressing Pt intubated and on the vent

## 2014-03-06 NOTE — Progress Notes (Addendum)
PULMONARY / CRITICAL CARE MEDICINE   Name: [name] [name] MRN: [id number] DOB: [birth year]    ADMISSION DATE:  03/02/2014  STUDIES:  CT-PA 9/3 >> no large PE (exam limited), B LAD, B LL predominant infiltrates and consolidation, small B effusions TTE 9/4/ >> regional LV hypokinesis and dilation, EF 30-35%, dilated LA,   SIGNIFICANT EVENTS: 48 resp->cardiac arrest 9/3 intubated  9/3 family at bedside. She is from [place] and is fighting with her husband. One english speaking daughter. Parents are POA per daughter.   HISTORY OF PRESENT ILLNESS:   [name](smoker) who was seen in ED 8/31 with SOB, treated and released. She presented to [facility] ED 9/3 with B infiltrates, acute resp distress and subsequently has respiratory/cardiac arrest with one round of epi/cpr/intubation. She was acidotic but improved with ventilatory support.   SUBJECTIVE:  Sedation changed to versed + fentanyl, note triglycerides Quant gold >> positive FiO2 and PEEP weaned to 0.40 + 5  VITAL SIGNS: Temp:  [95.7 F (35.4 C)-100.8 F (38.2 C)] 100.4 F (38 C) (09/07 0900) Pulse Rate:  [75-124] 107 (09/07 0900) Resp:  [12-49] 36 (09/07 0900) BP: (85)/(44) 85/44 mmHg (09/06 1230) SpO2:  [93 %-100 %] 100 % (09/07 0900) Arterial Line BP: (76-141)/(39-76) 108/58 mmHg (09/07 0900) FiO2 (%):  [30 %-50 %] 40 % (09/07 0800) Weight:  [65.7 kg (144 lb 13.5 oz)] 65.7 kg (144 lb 13.5 oz) (09/07 0500) HEMODYNAMICS:   VENTILATOR SETTINGS: Vent Mode:  [-] PRVC FiO2 (%):  [30 %-50 %] 40 % Set Rate:  [32 bmp] 32 bmp Vt Set:  [360 mL] 360 mL PEEP:  [5 cmH20-8 cmH20] 5 cmH20 Plateau Pressure:  [22 cmH20-27 cmH20] 26 cmH20 INTAKE / OUTPUT:  Intake/Output Summary (Last 24 hours) at 03/06/14 0944 Last data filed at 03/06/14 0935  Gross per 24 hour  Intake 2747.96 ml  Output   2250 ml  Net 497.96 ml    PHYSICAL EXAMINATION: General:  WNWD Hispanic female, MV Neuro:  Sleeping but quick to wake and grimace w stim,  maex 4 spont,  HEENT:  No JVD/LAN, ott->vent OGT->suction Cardiovascular:  HSR RRR Lungs:  Coarse rhonchi bilaterally, no wheeze Abdomen:  Soft,+bs Musculoskeletal:  Intact Skin:  Warm and dry  LABS:  CBC  Recent Labs Lab 03/04/14 0400 03/05/14 0412 03/06/14 0535  WBC 13.5* 13.4* 10.6*  HGB 10.0* 9.5* 9.3*  HCT 30.0* 28.8* 28.8*  PLT 223 267 298   Coag's  Recent Labs Lab 03/02/14 1235  APTT 24  INR 1.08   BMET  Recent Labs Lab 03/03/14 0349 03/05/14 0412 03/06/14 0535  NA 139 141 142  K 3.9 3.9 3.8  CL 103 107 106  CO2 BUN CREATININE 0.80 0.71 0.68  GLUCOSE 114* 99 86   Electrolytes  Recent Labs Lab 03/02/14 1235 03/03/14 0349 03/04/14 0400 03/05/14 0412 03/06/14 0535  CALCIUM  --  8.4  --  8.5 8.6  MG  --   --  1.9  --  1.9  PHOS 4.4  --  2.0*  --  3.5   Sepsis Markers  Recent Labs Lab 03/02/14 1012 03/02/14 1235 03/03/14 0950  LATICACIDVEN 10.62* 2.3* 0.7  PROCALCITON  --  0.17  --    ABG  Recent Labs Lab 03/04/14 0305 03/04/14 1238 03/04/14 1400  PHART 7.448 7.253* 7.323*  PCO2ART 33.1* 50.7* 42.3  PO2ART 63.6* 86.5 253.0*   Liver Enzymes No results found for this basename: AST,  ALT, ALKPHOS, BILITOT, ALBUMIN,  in the last 168 hours Cardiac Enzymes  Recent Labs Lab 03/02/14 1002  PROBNP 1799.0*   Glucose  Recent Labs Lab 03/05/14 1312 03/05/14 1550 03/05/14 2103 03/06/14 0015 03/06/14 0430 03/06/14 0753  GLUCAP 139* 135* 83 91 88 81    Imaging No results found.   ASSESSMENT / PLAN:  PULMONARY OETT 9/3>> A: Acute resp failure Multilobar, LL predominant CAP ARDS Posterior scarring, ? Prior infectious process. TB considered, AFB now negative x3 but Quant Gold = positive.  P:   Continue ARDS protocol Sedation to tolerate vent strategy Issue of resp isolation difficult > quant gold positive but AFB negative  3. At this time based on the nature of her infiltrates and the negative AFB it  is reasonable to keep[ her off resp isolation. I will discuss with ID.   CARDIOVASCULAR CVL 9/3 l i j cvl>> Aline 9/3>> A:  Post cardiac arrest secondary resp arrest Shock, likely septic shock from PNA, consider superimposed cardiogenic component (EF 35%) P:  Titrate norepi, wean to off 9/7 if possible CVP monitoring, goal 7-8 Abx as below  RENAL A:   Acute renal insufficiency post-arrest, resolved P:   Follow BMP  GASTROINTESTINAL A:   GI protection Nutrition P:   PPI Start TF on 9/7  HEMATOLOGIC A:   No acute issue P:  Follow CBC  INFECTIOUS A:   Presumed CAP Area of superior scarring vs nodularity raises possibility of Tb, AFB negative P:   BCx2  9/3>> UC  9/3>> negative Sputum 9/3>> normal flora Quan gold 9/4>> POSITIVE Sputum AFB 9/3 >> negative Sputum AFB 9/4 >> negative Sputum AFB 9/4 >> negative  Abx: vanc, start date 9/3, day 5/7 Abx: pip/tazo, start date 9/3, day 5/7 Abx: azithro, start date 9/5, day 3/5   ENDOCRINE A:   Hyperglycemia P:   SSI per protocol  NEUROLOGIC A:   Sedation Hx EtOH abuse, withdrawal potential  P:   RASS goal: -2 Changed RASS goal to -2 to allow low Vt vent strategy Sedation changed from propofol to versed + fentanyl on 9/6pm Completed thiamine + folate x 3 days  TODAY'S SUMMARY:  VDRF and ARDS, septic shock due to multilobar CAP. Sedation increased to tolerate low Vt's. Pressors started 9/5, weaning to off. Note EtOH hx. She will need a letter to document her Critical illness to support her brother's efforts to get here from the [place]. Will ask case Management to help with this.    CC time 40 minutes  Levy Pupa, MD, PhD 03/06/2014, 9:44 AM [facility] [phone] or if no answer [phone]

## 2014-03-06 NOTE — Progress Notes (Signed)
NUTRITION FOLLOW UP  INTERVENTION: Will begin TF with Oxepa at 20 ml/hr and increase 10 ml every 4 hours to goal of 45 ml/hr per OG. Add prostat 15 ml bid Above to provide:  1820 kcal, 98 gm protein, 853 ml water and meet 100% estimated kcal and 100% estimated protein needs. Initiate TF protocol. RD to follow.  NUTRITION DIAGNOSIS: Inadequate oral intae related to inability to eat as evidenced by npo status.   Goal: Based on plan of care.  Patient to meet >90% estimated needs with TF vs oral nutrition- progressing.  Monitor:  Plan of care, diet advancement, labs, weight trend.  Reason for Assessment: vent  48 y.o. female  Admitting Dx: <principal problem not specified>  ASSESSMENT: Acute respiratory failure, multilobar LL predominant CAP, ARDS.  Post cardiac arrest secondary to respiratory arrest, shock.  9/4:  Family not available.  Spoke with nurse.  Patient requires maximum sedation.  Intubated.  9/7: -Round with MD and nursing -No BM since admit -Order to begin TF -Propofol d/c'ed  Patient is currently intubated on ventilator support MV: 11.9 L/min Temp (24hrs), Avg:98.4 F (36.9 C), Min:95.7 F (35.4 C), Max:100.8 F (38.2 C)    Height: Ht Readings from Last 1 Encounters:  03/02/14 5\' 6"  (1.676 m)    Weight: Wt Readings from Last 1 Encounters:  03/06/14 144 lb 13.5 oz (65.7 kg)    Ideal Body Weight: 130 lbs  % Ideal Body Weight: 111  Wt Readings from Last 10 Encounters:  03/06/14 144 lb 13.5 oz (65.7 kg)    Usual Body Weight: unknown  % Usual Body Weight: unknown  BMI:  Body mass index is 23.39 kg/(m^2).  Estimated Nutritional Needs: Kcal: 1837 Protein: 85-95 gm  Fluid: 1.8-2 L daily  Skin: intact  Diet Order: NPO  EDUCATION NEEDS: -No education needs identified at this time   Intake/Output Summary (Last 24 hours) at 03/06/14 0938 Last data filed at 03/06/14 0935  Gross per 24 hour  Intake 2750.35 ml  Output   2250 ml  Net  500.35 ml    Last BM: unknown  Labs:   Recent Labs Lab 03/02/14 1235 03/03/14 0349 03/04/14 0400 03/05/14 0412 03/06/14 0535  NA  --  139  --  141 142  K  --  3.9  --  3.9 3.8  CL  --  103  --  107 106  CO2  --  25  --  20 23  BUN  --  13  --  9 7  CREATININE  --  0.80  --  0.71 0.68  CALCIUM  --  8.4  --  8.5 8.6  MG  --   --  1.9  --  1.9  PHOS 4.4  --  2.0*  --  3.5  GLUCOSE  --  114*  --  99 86    CBG (last 3)   Recent Labs  03/06/14 0015 03/06/14 0430 03/06/14 0753  GLUCAP 91 88 81    Scheduled Meds: . antiseptic oral rinse  7 mL Mouth Rinse QID  . azithromycin  500 mg Intravenous Q24H  . chlorhexidine  15 mL Mouth Rinse BID  . folic acid  1 mg Intravenous Daily  . heparin  5,000 Units Subcutaneous 3 times per day  . insulin aspart  0-15 Units Subcutaneous 6 times per day  . ipratropium-albuterol  3 mL Nebulization Q4H  . pantoprazole (PROTONIX) IV  40 mg Intravenous QHS  . piperacillin-tazobactam (ZOSYN)  IV  3.375 g Intravenous 3 times per day  . thiamine IV  100 mg Intravenous Daily  . vancomycin  750 mg Intravenous Q8H    Continuous Infusions: . sodium chloride 50 mL/hr at 03/05/14 0900  . fentaNYL infusion INTRAVENOUS 300 mcg/hr (03/06/14 0800)  . midazolam (VERSED) infusion 1 mg/hr (03/06/14 0823)  . norepinephrine (LEVOPHED) Adult infusion 3 mcg/min (03/06/14 0935)    Past Medical History  Diagnosis Date  . Back pain   . Gastritis     Past Surgical History  Procedure Laterality Date  . Cesarean section      Oran Rein, RD, LDN Clinical Inpatient Dietitian Pager:  [phone] Weekend and after hours pager:  [phone]

## 2014-03-06 NOTE — Plan of Care (Signed)
Problem: Phase I Progression Outcomes Goal: ARDS Protocol initiated if indicated Outcome: Progressing Pt currently on 30% FiO2

## 2014-03-06 NOTE — Progress Notes (Signed)
Called to bedside by nurse. PT desat into the 60's and appeared to be grimacing. HR went up to 130 and ABP also started to increase. RT gave pt 100% oxygen, suctioned pt with saline and got out a plug and a moderate amount of secretions. SATS then stated to rise and fall. Rt placed pt back on 8 of peep and 40% oxygen according to sats and ARDS protocol. Pt seemed to be getting better sats and nurse gave pt a blous of propofol. Pt seemed to relax and not working as hard to breathe. SATS are now remaining in the high 90's. RT will give report to next shift to monitor.

## 2014-03-06 NOTE — Plan of Care (Signed)
Problem: Phase I Progression Outcomes Goal: Patient tolerating nututrition at goal Outcome: Not Applicable Date Met:  26/83/41 Pt not on any diet at this time

## 2014-03-06 NOTE — Progress Notes (Signed)
ETT pulled back 2cm from 25 to 23 per chest x ray.

## 2014-03-06 NOTE — Plan of Care (Signed)
Problem: Phase I Progression Outcomes Goal: Hemodynamically stable Outcome: Not Progressing Pt on Levophed, pt still requiring pressors to maintain adequate BP

## 2014-03-07 ENCOUNTER — Encounter (HOSPITAL_COMMUNITY): Payer: MEDICAID | Admitting: Anesthesiology

## 2014-03-07 ENCOUNTER — Inpatient Hospital Stay (HOSPITAL_COMMUNITY): Payer: Self-pay

## 2014-03-07 ENCOUNTER — Inpatient Hospital Stay (HOSPITAL_COMMUNITY): Payer: Self-pay | Admitting: Anesthesiology

## 2014-03-07 DIAGNOSIS — G9340 Encephalopathy, unspecified: Secondary | ICD-10-CM

## 2014-03-07 DIAGNOSIS — J9601 Acute respiratory failure with hypoxia: Secondary | ICD-10-CM

## 2014-03-07 LAB — GLUCOSE, CAPILLARY
GLUCOSE-CAPILLARY: 97 mg/dL (ref 70–99)
Glucose-Capillary: 109 mg/dL — ABNORMAL HIGH (ref 70–99)
Glucose-Capillary: 118 mg/dL — ABNORMAL HIGH (ref 70–99)
Glucose-Capillary: 123 mg/dL — ABNORMAL HIGH (ref 70–99)
Glucose-Capillary: 123 mg/dL — ABNORMAL HIGH (ref 70–99)
Glucose-Capillary: 125 mg/dL — ABNORMAL HIGH (ref 70–99)
Glucose-Capillary: 91 mg/dL (ref 70–99)

## 2014-03-07 LAB — BASIC METABOLIC PANEL
Anion gap: 12 (ref 5–15)
BUN: 9 mg/dL (ref 6–23)
CO2: 25 meq/L (ref 19–32)
Calcium: 8.7 mg/dL (ref 8.4–10.5)
Chloride: 102 mEq/L (ref 96–112)
Creatinine, Ser: 0.65 mg/dL (ref 0.50–1.10)
GFR calc Af Amer: >90 mL/min (ref >90)
GFR calc non Af Amer: >90 mL/min (ref >90)
GLUCOSE: 122 mg/dL — AB (ref 70–99)
Potassium: 3.6 mEq/L — ABNORMAL LOW (ref 3.7–5.3)
SODIUM: 139 meq/L (ref 137–147)

## 2014-03-07 LAB — TRIGLYCERIDES: TRIGLYCERIDES: 197 mg/dL — AB (ref <150)

## 2014-03-07 MED ORDER — DEXMEDETOMIDINE HCL IN NACL 200 MCG/50ML IV SOLN
0.0000 ug/kg/h | INTRAVENOUS | Status: DC
  Administered 2014-03-08 (×3): 1.1 ug/kg/h via INTRAVENOUS
  Filled 2014-03-07 (×3): qty 50

## 2014-03-07 MED ORDER — ETOMIDATE 2 MG/ML IV SOLN
INTRAVENOUS | Status: DC | PRN
  Administered 2014-03-07: 8 mg via INTRAVENOUS

## 2014-03-07 MED ORDER — DEXMEDETOMIDINE HCL IN NACL 200 MCG/50ML IV SOLN
0.4000 ug/kg/h | INTRAVENOUS | Status: DC
  Administered 2014-03-07: 0.8 ug/kg/h via INTRAVENOUS
  Administered 2014-03-07: 1 ug/kg/h via INTRAVENOUS
  Administered 2014-03-07: 0.8 ug/kg/h via INTRAVENOUS
  Administered 2014-03-07: 1 ug/kg/h via INTRAVENOUS
  Filled 2014-03-07 (×4): qty 50

## 2014-03-07 MED ORDER — POTASSIUM CHLORIDE 20 MEQ/15ML (10%) PO LIQD
40.0000 meq | Freq: Two times a day (BID) | ORAL | Status: AC
  Administered 2014-03-07 – 2014-03-08 (×2): 40 meq
  Filled 2014-03-07 (×2): qty 30

## 2014-03-07 MED ORDER — OXEPA PO LIQD
1000.0000 mL | ORAL | Status: DC
  Administered 2014-03-07 – 2014-03-10 (×4): 1000 mL
  Filled 2014-03-07 (×7): qty 1000

## 2014-03-07 MED ORDER — FUROSEMIDE 10 MG/ML IJ SOLN
40.0000 mg | Freq: Two times a day (BID) | INTRAMUSCULAR | Status: AC
  Administered 2014-03-07 – 2014-03-08 (×2): 40 mg via INTRAVENOUS
  Filled 2014-03-07 (×2): qty 4

## 2014-03-07 MED ORDER — DEXTROSE 5 % IV SOLN
30.0000 ug/min | INTRAVENOUS | Status: DC
  Administered 2014-03-07: 50 ug/min via INTRAVENOUS
  Administered 2014-03-07 – 2014-03-08 (×4): 30 ug/min via INTRAVENOUS
  Administered 2014-03-09: 45 ug/min via INTRAVENOUS
  Filled 2014-03-07 (×9): qty 1

## 2014-03-07 MED ORDER — POTASSIUM CHLORIDE 20 MEQ/15ML (10%) PO LIQD
20.0000 meq | ORAL | Status: AC
  Administered 2014-03-07 (×2): 20 meq
  Filled 2014-03-07 (×3): qty 15

## 2014-03-07 NOTE — Progress Notes (Signed)
[facility] ADULT ICU REPLACEMENT PROTOCOL FOR AM LAB REPLACEMENT ONLY  The patient does apply for the [facility] Adult ICU Electrolyte Replacment Protocol based on the criteria listed below:   1. Is GFR >/= 40 ml/min? Yes.    Patient's GFR today is 90 2. Is urine output >/= 0.5 ml/kg/hr for the last 6 hours? Yes.   Patient's UOP is 2.3 ml/kg/hr 3. Is BUN < 60 mg/dL? Yes.    Patient's BUN today is 9 4. Abnormal electrolyte(s): K 3.6 5. Ordered repletion with: per protocol 6. If a panic level lab has been reported, has the CCM MD in charge been notified? No..   Physician:    Markus Daft A 03/07/2014 5:33 AM

## 2014-03-07 NOTE — Progress Notes (Signed)
NUTRITION FOLLOW UP  INTERVENTION: - Continue Oxepa at 60m/hr with Prostat 367mBID via OGT - RD to continue to monitor   NUTRITION DIAGNOSIS: Inadequate oral intake related to inability to eat as evidenced by npo status - ongoing   Goal: Based on plan of care.  Patient to meet >90% estimated needs with TF vs oral nutrition- progressing - met   Monitor:  Weights, labs, TF tolerance, vent status   Reason for Assessment: vent  [age].o. female  Admitting Dx: Acute respiratory distress, cardiac arrest   ASSESSMENT: Acute respiratory failure, multilobar LL predominant CAP, ARDS.  Post cardiac arrest secondary to respiratory arrest, shock.  9/4:  Family not available.  Spoke with nurse.  Patient requires maximum sedation.  Intubated.  9/7: -Round with MD and nursing -No BM since admit -Order to begin TF -Propofol d/c'ed  9/8: -Remains intubated  -Per RN, pt without any TF residuals today  -Remains on ARDS protocol  -Weight up 13 pounds since admission, with generalized edema  Patient is currently intubated on ventilator support MV: 11.4 L/min Temp (24hrs), Avg:99.6 F (37.6 C), Min:98.2 F (36.8 C), Max:101.3 F (38.5 C)  Propofol: off  TF via OGT: Oxepa at 45 ml/hr with Prostat 3031mID which provides 1820 kcal, 98 gm protein, 853 ml water and meet 103% estimated kcal and 103% estimated protein needs.  Potassium slightly low, getting liquid replacement Magnesium and phosphorus WNL   Height: Ht Readings from Last 1 Encounters:  03/02/14 5' 6"  (1.676 m)    Weight: Wt Readings from Last 1 Encounters:  03/07/14 143 lb 11.8 oz (65.2 kg)    Ideal Body Weight: 130 lbs  % Ideal Body Weight: 111  Wt Readings from Last 10 Encounters:  03/07/14 143 lb 11.8 oz (65.2 kg)    Usual Body Weight: unknown  % Usual Body Weight: unknown  BMI:  Body mass index is 23.21 kg/(m^2).  Estimated Nutritional Needs: Kcal: 1770 Protein: 85-95 gm  Fluid: 1.8-2 L  daily  Skin: intact  Diet Order: NPO  EDUCATION NEEDS: -No education needs identified at this time   Intake/Output Summary (Last 24 hours) at 03/07/14 0904 Last data filed at 03/07/14 0800  Gross per 24 hour  Intake 3697.2 ml  Output   1855 ml  Net 1842.2 ml    Last BM: unknown  Labs:   Recent Labs Lab 03/02/14 1235  03/04/14 0400 03/05/14 0412 03/06/14 0535 03/07/14 0430  NA  --   < >  --  141 142 139  K  --   < >  --  3.9 3.8 3.6*  CL  --   < >  --  107 106 102  CO2  --   < >  --  20 23 25   BUN  --   < >  --  9 7 9   CREATININE  --   < >  --  0.71 0.68 0.65  CALCIUM  --   < >  --  8.5 8.6 8.7  MG  --   --  1.9  --  1.9  --   PHOS 4.4  --  2.0*  --  3.5  --   GLUCOSE  --   < >  --  99 86 122*  < > = values in this interval not displayed.  CBG (last 3)   Recent Labs  03/06/14 1936 03/07/14 0001 03/07/14 0443  GLUCAP 82 97 125*    Scheduled Meds: . antiseptic oral  rinse  7 mL Mouth Rinse QID  . azithromycin  500 mg Intravenous Q24H  . chlorhexidine  15 mL Mouth Rinse BID  . feeding supplement (OXEPA)  1,000 mL Per Tube Q24H  . feeding supplement (PRO-STAT SUGAR FREE 64)  30 mL Per Tube BID  . heparin  5,000 Units Subcutaneous 3 times per day  . insulin aspart  0-15 Units Subcutaneous 6 times per day  . ipratropium-albuterol  3 mL Nebulization Q4H  . pantoprazole (PROTONIX) IV  40 mg Intravenous QHS  . piperacillin-tazobactam (ZOSYN)  IV  3.375 g Intravenous 3 times per day  . potassium chloride  20 mEq Per Tube Q4H  . vancomycin  750 mg Intravenous Q8H    Continuous Infusions: . sodium chloride 50 mL/hr at 03/06/14 1600  . fentaNYL infusion INTRAVENOUS 350 mcg/hr (03/07/14 0745)  . midazolam (VERSED) infusion 2 mg/hr (03/07/14 0730)  . norepinephrine (LEVOPHED) Adult infusion Stopped (03/07/14 0200)    Past Medical History  Diagnosis Date  . Back pain   . Gastritis     Past Surgical History  Procedure Laterality Date  . Cesarean section       Carlis Stable MS, [place], [place] Pager [phone] Weekend/After Hours Pager

## 2014-03-07 NOTE — Progress Notes (Signed)
PULMONARY / CRITICAL CARE MEDICINE   Name: [name] [name] MRN: [id number] DOB: 1966/05/19    ADMISSION DATE:  03/02/2014  STUDIES:  CT-PA 9/3 >> no large PE (exam limited), B LAD, B LL predominant infiltrates and consolidation, small B effusions TTE 9/4/ >> regional LV hypokinesis and dilation, EF 30-35%, dilated LA,   SIGNIFICANT EVENTS: 9/3 resp->cardiac arrest 9/3 intubated. Placed on respiratory isolation given concern about Tb w/ superior scarring in lung.   9/3 family at bedside. She is from [place] and is fighting with her husband. One english speaking daughter. Parents are POA per daughter. 9/5: increased FIO2 needs. Transitioned to ARDS protocol. Vasopressors started.  9/6 AFB negative X 3. But quantiferon gold positive. Spoke w/ ID. Given three negative AFBs resp isolation d/c'd 9/7 sedation increased to tolerate low Vt ventilation. Weaned off pressors.  9/8 attempting to transition to precedex from versed. Starting diuresis per FACT approach    HISTORY OF PRESENT ILLNESS:   [name](smoker) who was seen in ED 8/31 with SOB, treated and released. She presented to [facility] ED 9/3 with B infiltrates, acute resp distress and subsequently has respiratory/cardiac arrest with one round of epi/cpr/intubation. She was acidotic but improved with ventilatory support.   SUBJECTIVE:  Working on sedation transition   VITAL SIGNS: Temp:  [98.2 F (36.8 C)-101.3 F (38.5 C)] 98.2 F (36.8 C) (09/08 0600) Pulse Rate:  [87-140] 88 (09/08 0600) Resp:  [12-74] 12 (09/08 0600) BP: (91-142)/(58-87) 91/58 mmHg (09/08 0315) SpO2:  [83 %-100 %] 99 % (09/08 0600) Arterial Line BP: (82-142)/(39-88) 95/56 mmHg (09/08 0600) FiO2 (%):  [30 %-40 %] 40 % (09/08 0800) Weight:  [65.2 kg (143 lb 11.8 oz)] 65.2 kg (143 lb 11.8 oz) (09/08 0525) HEMODYNAMICS:   VENTILATOR SETTINGS: Vent Mode:  [-] PRVC FiO2 (%):  [30 %-40 %] 40 % Set Rate:  [32 bmp] 32 bmp Vt Set:  [360 mL] 360 mL PEEP:  [5 cmH20]  5 cmH20 Plateau Pressure:  [23 cmH20-29 cmH20] 27 cmH20 INTAKE / OUTPUT:  Intake/Output Summary (Last 24 hours) at 03/07/14 0819 Last data filed at 03/07/14 0600  Gross per 24 hour  Intake 3641.77 ml  Output   1505 ml  Net 2136.77 ml    PHYSICAL EXAMINATION: General:  WNWD Hispanic female, MV Neuro:  Spontaneous eye opening, moving all extremities through sedation/analgesia.  HEENT:  No JVD/LAN, ott->vent OGT->suction Cardiovascular:  ST, RRR Lungs:  Coarse rhonchi bilaterally, no wheeze Abdomen:  Soft,+bs Musculoskeletal:  Intact Skin:  Warm and dry  LABS:  CBC  Recent Labs Lab 03/04/14 0400 03/05/14 0412 03/06/14 0535  WBC 13.5* 13.4* 10.6*  HGB 10.0* 9.5* 9.3*  HCT 30.0* 28.8* 28.8*  PLT 223 267 298   Coag's  Recent Labs Lab 03/02/14 1235  APTT 24  INR 1.08   BMET  Recent Labs Lab 03/05/14 0412 03/06/14 0535 03/07/14 0430  NA 141 142 139  K 3.9 3.8 3.6*  CL 107 106 102  CO2 BUN CREATININE 0.71 0.68 0.65  GLUCOSE 99 86 122*   Electrolytes  Recent Labs Lab 03/02/14 1235  03/04/14 0400 03/05/14 0412 03/06/14 0535 03/07/14 0430  CALCIUM  --   < >  --  8.5 8.6 8.7  MG  --   --  1.9  --  1.9  --   PHOS 4.4  --  2.0*  --  3.5  --   < > = values in this  interval not displayed. Sepsis Markers  Recent Labs Lab 03/02/14 1012 03/02/14 1235 03/03/14 0950  LATICACIDVEN 10.62* 2.3* 0.7  PROCALCITON  --  0.17  --    ABG  Recent Labs Lab 03/04/14 0305 03/04/14 1238 03/04/14 1400  PHART 7.448 7.253* 7.323*  PCO2ART 33.1* 50.7* 42.3  PO2ART 63.6* 86.5 253.0*   Liver Enzymes No results found for this basename: AST, ALT, ALKPHOS, BILITOT, ALBUMIN,  in the last 168 hours Cardiac Enzymes  Recent Labs Lab 03/02/14 1002  PROBNP 1799.0*   Glucose  Recent Labs Lab 03/06/14 0753 03/06/14 1140 03/06/14 1558 03/06/14 1936 03/07/14 0001 03/07/14 0443  GLUCAP 81 74 90 82 97 125*    Imaging Dg Chest Port 1  View  03/06/2014   CLINICAL DATA:  Respiratory failure.  EXAM: PORTABLE CHEST - 1 VIEW  COMPARISON:  03/04/2014.  FINDINGS: Endotracheal tube tip is at the level of carina. Recommend retracting by 2-3 cm to avoid mainstem bronchus intubation with change of patients head position.  Nasogastric tube courses below the diaphragm. Tip is not included on the present exam.  Left central line tip projects at the level of the mid to distal superior vena cava. No gross pneumothorax.  Right apical parenchymal changes similar to prior exam.  Interval improved aeration mid to lower lung zones.  Persistent pulmonary vascular congestion/ pulmonary edema.  Retrocardiac opacity may represent atelectasis with pleural fluid. Infiltrate not excluded. Recommend followup until clearance.  Cardiomegaly.  IMPRESSION: Endotracheal tube tip needs to be repositioned as discussed above.  Interval slight improved aeration mid to lower lung zones. Please see above discussion.  These results were called by telephone at the time of interpretation on 03/06/2014 at 8:05 am to [facility] patient's nurse, who verbally acknowledged these results.   Electronically Signed   By: Bridgett Larsson M.D.   On: 03/06/2014 08:08     ASSESSMENT / PLAN:  PULMONARY OETT 9/3>> A: Acute resp failure Multilobar, LL predominant CAP ARDS Posterior scarring, ? Prior infectious process. TB considered, AFB now negative x3 but Quant Gold = positive.  Persistent infiltrates.  P:   Continue ARDS protocol; obtain ABG to evaluate .38fi02 and peep of 5 Push diuresis. Aim for net neg 1 liter minimum  Cont PAD protocol. See neuro section.  Sedation to tolerate vent strategy Issue of resp isolation difficult > quant gold positive but AFB negative  At this time based on the nature of her infiltrates and the negative AFB it is reasonable to keep her off resp isolation.   CARDIOVASCULAR CVL 9/3 l i j cvl>> Aline 9/3>> A:  Post cardiac arrest secondary resp arrest Shock,  likely septic shock from PNA, consider superimposed cardiogenic component (EF 35%) Map >65. Pressors off since 9/7.  P: Allow for negative fluid balance Cont tele monitoring   RENAL A:   Acute renal insufficiency post-arrest, resolved Hypokalemia  P:   Follow BMP Replace potassium  GASTROINTESTINAL A:   GI protection Nutrition: Tolerating tube feeding P:   PPI Continue tube feeds,    HEMATOLOGIC A:   No acute issue P:  Obtain CBC Transfuse for Hgb < 7  INFECTIOUS A:   Presumed CAP Area of superior scarring vs nodularity raises possibility of Tb, AFB negative P:   BCx2  9/3>> UC  9/3>> negative Sputum 9/3>> normal flora Quan gold 9/4>> POSITIVE Sputum AFB 9/3 >> negative Sputum AFB 9/4 >> negative Sputum AFB 9/4 >> negative  Abx: vanc, start date 9/3, day 6/7 Abx: pip/tazo,  start date 9/3, day 6/7 Abx: azithro, start date 9/5, day 4/5   ENDOCRINE A:   Hyperglycemia P:   SSI per protocol  NEUROLOGIC A:   Sedation Hx EtOH abuse, withdrawal potential  P:   RASS goal  -2 Will try to transition to precedex and fentanyl w/ goal to d/c benzos   Anders Simmonds, ACNP PCCM, [facility] 03/07/14   TODAY'S STAFF MD  SUMMARY:  STAFF NOTE: I, Dr Lavinia Sharps have personally reviewed patient's available data, including medical history, events of note, physical examination and test results as part of my evaluation. I have discussed with resident/NP and other care providers such as pharmacist, RN and RRT.  In addition,  I personally evaluated patient and elicited key findings of VDRF and ARDS, septic shock due to multilobar CAP. Pressors off since 9/7. Note EtOH hx. Will try begin transitioning off benzos and push diuresis in hopes that we can initiate weaning efforts in the next 24 hrs.   Rest per NP/medical resident whose note is outlined above and that I agree with  The patient is critically ill with multiple organ systems failure and requires high  complexity decision making for assessment and support, frequent evaluation and titration of therapies, application of advanced monitoring technologies and extensive interpretation of multiple databases.   Critical Care Time devoted to patient care services described in this note is  35  Minutes.  Dr. Kalman Shan, M.D., [facility].C.P Pulmonary and Critical Care Medicine Staff Physician [facility] [place] [facility] Pager: [phone], If no answer or between  15:00h - 7:00h: call [phone]  03/07/2014 12:27 PM

## 2014-03-08 ENCOUNTER — Inpatient Hospital Stay (HOSPITAL_COMMUNITY): Payer: Self-pay

## 2014-03-08 LAB — GLUCOSE, CAPILLARY
GLUCOSE-CAPILLARY: 124 mg/dL — AB (ref 70–99)
GLUCOSE-CAPILLARY: 138 mg/dL — AB (ref 70–99)
GLUCOSE-CAPILLARY: 141 mg/dL — AB (ref 70–99)
GLUCOSE-CAPILLARY: 151 mg/dL — AB (ref 70–99)
Glucose-Capillary: 193 mg/dL — ABNORMAL HIGH (ref 70–99)
Glucose-Capillary: 146 mg/dL — ABNORMAL HIGH (ref 70–99)

## 2014-03-08 LAB — CBC
HEMATOCRIT: 30.1 % — AB (ref 36.0–46.0)
Hemoglobin: 9.9 g/dL — ABNORMAL LOW (ref 12.0–15.0)
MCH: 29.9 pg (ref 26.0–34.0)
MCHC: 32.9 g/dL (ref 30.0–36.0)
MCV: 90.9 fL (ref 78.0–100.0)
Platelets: 315 10*3/uL (ref 150–400)
RBC: 3.31 MIL/uL — ABNORMAL LOW (ref 3.87–5.11)
RDW: 13.4 % (ref 11.5–15.5)
WBC: 11.1 10*3/uL — ABNORMAL HIGH (ref 4.0–10.5)

## 2014-03-08 LAB — BLOOD GAS, ARTERIAL
Acid-Base Excess: 1.1 mmol/L (ref 0.0–2.0)
BICARBONATE: 24.5 meq/L — AB (ref 20.0–24.0)
Drawn by: 308601
FIO2: 0.4 %
O2 SAT: 94.1 %
PEEP: 5 cmH2O
Patient temperature: 98.6
RATE: 32 resp/min
TCO2: 22.7 mmol/L (ref 0–100)
VT: 360 mL
pCO2 arterial: 36.4 mmHg (ref 35.0–45.0)
pH, Arterial: 7.444 (ref 7.350–7.450)
pO2, Arterial: 70.5 mmHg — ABNORMAL LOW (ref 80.0–100.0)

## 2014-03-08 LAB — COMPREHENSIVE METABOLIC PANEL
ALK PHOS: 82 U/L (ref 39–117)
ALT: 67 U/L — ABNORMAL HIGH (ref 0–35)
ANION GAP: 13 (ref 5–15)
AST: 30 U/L (ref 0–37)
Albumin: 2.6 g/dL — ABNORMAL LOW (ref 3.5–5.2)
BUN: 14 mg/dL (ref 6–23)
CALCIUM: 9.1 mg/dL (ref 8.4–10.5)
CO2: 26 mEq/L (ref 19–32)
Chloride: 100 mEq/L (ref 96–112)
Creatinine, Ser: 0.83 mg/dL (ref 0.50–1.10)
GFR calc Af Amer: >90 mL/min (ref >90)
GFR calc non Af Amer: 83 mL/min — ABNORMAL LOW (ref >90)
Glucose, Bld: 139 mg/dL — ABNORMAL HIGH (ref 70–99)
Potassium: 3.7 mEq/L (ref 3.7–5.3)
Sodium: 139 mEq/L (ref 137–147)
TOTAL PROTEIN: 6.8 g/dL (ref 6.0–8.3)
Total Bilirubin: 0.9 mg/dL (ref 0.3–1.2)

## 2014-03-08 LAB — PROCALCITONIN: Procalcitonin: 0.58 ng/mL

## 2014-03-08 MED ORDER — FUROSEMIDE 10 MG/ML IJ SOLN
40.0000 mg | Freq: Three times a day (TID) | INTRAMUSCULAR | Status: AC
  Administered 2014-03-08 – 2014-03-09 (×3): 40 mg via INTRAVENOUS
  Filled 2014-03-08 (×3): qty 4

## 2014-03-08 MED ORDER — PANTOPRAZOLE SODIUM 40 MG PO PACK
40.0000 mg | PACK | Freq: Every day | ORAL | Status: DC
  Administered 2014-03-08 – 2014-03-12 (×5): 40 mg
  Filled 2014-03-08 (×8): qty 20

## 2014-03-08 MED ORDER — CLONAZEPAM 0.1 MG/ML ORAL SUSPENSION
0.5000 mg | Freq: Two times a day (BID) | ORAL | Status: DC
Start: 2014-03-08 — End: 2014-03-08
  Filled 2014-03-08 (×2): qty 5

## 2014-03-08 MED ORDER — MIDAZOLAM BOLUS VIA INFUSION
1.0000 mg | INTRAVENOUS | Status: DC | PRN
  Administered 2014-03-08 – 2014-03-10 (×2): 2 mg via INTRAVENOUS
  Administered 2014-03-10: 1 mg via INTRAVENOUS
  Administered 2014-03-11 (×3): 2 mg via INTRAVENOUS
  Filled 2014-03-08: qty 2

## 2014-03-08 MED ORDER — MIDAZOLAM HCL 2 MG/2ML IJ SOLN
2.0000 mg | Freq: Once | INTRAMUSCULAR | Status: AC
  Administered 2014-03-08: 2 mg via INTRAVENOUS

## 2014-03-08 MED ORDER — MIDAZOLAM HCL 2 MG/2ML IJ SOLN
1.0000 mg | INTRAMUSCULAR | Status: DC | PRN
  Administered 2014-03-08: 2 mg via INTRAVENOUS
  Filled 2014-03-08 (×2): qty 2

## 2014-03-08 MED ORDER — POTASSIUM CHLORIDE 20 MEQ/15ML (10%) PO LIQD
40.0000 meq | Freq: Three times a day (TID) | ORAL | Status: AC
  Administered 2014-03-08 – 2014-03-09 (×3): 40 meq
  Filled 2014-03-08 (×3): qty 30

## 2014-03-08 MED ORDER — SODIUM CHLORIDE 0.9 % IV SOLN
0.0000 mg/h | INTRAVENOUS | Status: DC
  Administered 2014-03-08: 1 mg/h via INTRAVENOUS
  Administered 2014-03-08 (×2): 2 mg/h via INTRAVENOUS
  Administered 2014-03-08: 1 mg/h via INTRAVENOUS
  Administered 2014-03-08 – 2014-03-09 (×2): 7 mg/h via INTRAVENOUS
  Administered 2014-03-09: 1 mg/h via INTRAVENOUS
  Administered 2014-03-09 (×2): 5 mg/h via INTRAVENOUS
  Administered 2014-03-09: 1 mg/h via INTRAVENOUS
  Administered 2014-03-10: 3 mg/h via INTRAVENOUS
  Administered 2014-03-10: 4 mg/h via INTRAVENOUS
  Administered 2014-03-11 (×2): 2 mg/h via INTRAVENOUS
  Administered 2014-03-11 (×2): 8 mg/h via INTRAVENOUS
  Administered 2014-03-11: 9 mg/h via INTRAVENOUS
  Filled 2014-03-08 (×11): qty 10

## 2014-03-08 MED ORDER — DEXMEDETOMIDINE HCL IN NACL 400 MCG/100ML IV SOLN
0.4000 ug/kg/h | INTRAVENOUS | Status: DC
  Administered 2014-03-08: 1.7 ug/kg/h via INTRAVENOUS
  Filled 2014-03-08: qty 100

## 2014-03-08 MED ORDER — CLONIDINE HCL 0.1 MG PO TABS
0.1000 mg | ORAL_TABLET | Freq: Two times a day (BID) | ORAL | Status: DC
  Administered 2014-03-08 – 2014-03-09 (×3): 0.1 mg
  Filled 2014-03-08 (×3): qty 1

## 2014-03-08 MED ORDER — QUETIAPINE FUMARATE 50 MG PO TABS
25.0000 mg | ORAL_TABLET | Freq: Two times a day (BID) | ORAL | Status: DC
  Administered 2014-03-08 – 2014-03-09 (×3): 25 mg
  Filled 2014-03-08 (×3): qty 1

## 2014-03-08 MED ORDER — CLONAZEPAM 0.5 MG PO TABS
0.5000 mg | ORAL_TABLET | Freq: Two times a day (BID) | ORAL | Status: DC
  Administered 2014-03-08 – 2014-03-09 (×3): 0.5 mg via ORAL
  Filled 2014-03-08 (×3): qty 1

## 2014-03-08 NOTE — Progress Notes (Addendum)
ANTIBIOTIC CONSULT NOTE -  Pharmacy Consult for Vancomycin and Zosyn Indication: Aspiration pneumonia  Assessment: 48yo F with resp distress, experienced cardiac arrest in the ED, ROSC was established and she was placed on the ventilator. CTA negative for PE. Recent admission with SOB, enlarged lymph nodes and abnormal lung findings. Ongoing tobacco use. Pharmacy is asked to dose broad-spectrum antibiotics for suspected aspiration pneumonia.  Noted to have continued low-grade fevers, which could be related to infectious process or Zosyn use.  9/3 >> Zosyn >>  9/3 >> Vanc >>  9/5 >> Azithro >>  Tmax: 100.4 F WBCs: Stable though slightly elevated SCr slightly higher today; CrCl ~ 79 ml/min CG  9/3, 9/4, 9/4: AFB: NGTD x 3 9/4: Quantiferon Gold Positive 9/3: blood: NGF x2 9/3: ET tracheal aspirate: oral flora 9/3: urine: NGF  Significant Events: 9/4: increase vanc from  q12h to  q8h for improved renal function 9/6 1130 VT: 17.2 on vanc  q8h (therapeutic)  Goal of Therapy:  Vancomycin trough level 15-20 mcg/ml Appropriate antibiotic dosing for renal function; eradication of infection  Plan:   Continue Vancomycin  IV Q8H.  With planned stopping tomorrow; will not check another trough  Continue Zosyn 3.375g IV Q8H infused over 4hrs.  Follow renal fxn and culture results.  No Known Allergies  Patient Measurements: Height:  (167.6 cm) Weight: 152 lb 12.5 oz (69.3 kg) IBW/kg (Calculated) : 59.3  Vital Signs: Temp: 100 F (37.8 C) (09/09 0900) Temp src: Core (Comment) (09/09 0400) BP: 104/62 mmHg (09/09 0900) Pulse Rate: 95 (09/09 0900) Intake/Output from previous day: 09/08 0701 - 09/09 0700 In: 4790.7 [I.V.:2883.9; NG/GT:1056.8; IV Piggyback:850] Out: 5150 [Urine:5150] Intake/Output from this shift:    Labs:  Recent Labs  03/06/14 0535 03/07/14 0430 03/08/14 0600  WBC 10.6*  --  11.1*  HGB 9.3*  --  9.9*  PLT 298  --  315   CREATININE 0.68 0.65 0.83   Estimated Creatinine Clearance: 79.3 ml/min (by C-G formula based on Cr of 0.83).  Recent Labs  03/05/14 1128  VANCOTROUGH 17.2     Microbiology: Recent Results (from the past 720 hour(s))  CULTURE, BLOOD (ROUTINE X 2)     Status: None   Collection Time    03/02/14 12:06 PM      Result Value Ref Range Status   Specimen Description BLOOD A LINE   Final   Special Requests BOTTLES DRAWN AEROBIC AND ANAEROBIC 6 CC   Final   Culture  Setup Time     Final   Value: 03/02/2014 19:57     Performed at [facility]   Culture     Final   Value: NO GROWTH 5 DAYS     Performed at [facility]   Report Status 03/08/2014 FINAL   Final  CULTURE, BLOOD (ROUTINE X 2)     Status: None   Collection Time    03/02/14 12:11 PM      Result Value Ref Range Status   Specimen Description BLOOD CENTRAL LINE   Final   Special Requests BOTTLES DRAWN AEROBIC AND ANAEROBIC 6 CC   Final   Culture  Setup Time     Final   Value: 03/02/2014 19:58     Performed at [facility]   Culture     Final   Value: NO GROWTH 5 DAYS     Performed at [facility]   Report Status 03/08/2014 FINAL   Final  URINE CULTURE  Status: None   Collection Time    03/02/14 12:22 PM      Result Value Ref Range Status   Specimen Description URINE, CATHETERIZED   Final   Special Requests Normal   Final   Culture  Setup Time     Final   Value: 03/02/2014 19:49     Performed at [facility]   Colony Count     Final   Value: NO GROWTH     Performed at [facility]   Culture     Final   Value: NO GROWTH     Performed at [facility]   Report Status 03/04/2014 FINAL   Final  MRSA PCR SCREENING     Status: None   Collection Time    03/02/14  3:12 PM      Result Value Ref Range Status   MRSA by PCR NEGATIVE  NEGATIVE Final   Comment:            The GeneXpert MRSA Assay (FDA     approved for NASAL specimens     only), is one component of a      comprehensive MRSA colonization     surveillance program. It is not     intended to diagnose MRSA     infection nor to guide or     monitor treatment for     MRSA infections.  CULTURE, RESPIRATORY (NON-EXPECTORATED)     Status: None   Collection Time    03/02/14 11:24 PM      Result Value Ref Range Status   Specimen Description ENDOTRACHEAL   Final   Special Requests Normal   Final   Gram Stain     Final   Value: ABUNDANT WBC PRESENT,BOTH PMN AND MONONUCLEAR     RARE SQUAMOUS EPITHELIAL CELLS PRESENT     NO ORGANISMS SEEN     Performed at [facility]   Culture     Final   Value: Non-Pathogenic Oropharyngeal-type Flora Isolated.     Performed at [facility]   Report Status 03/05/2014 FINAL   Final  AFB CULTURE WITH SMEAR     Status: None   Collection Time    03/02/14 11:24 PM      Result Value Ref Range Status   Specimen Description ENDOTRACHEAL   Final   Special Requests Normal   Final   Acid Fast Smear     Final   Value: NO ACID FAST BACILLI SEEN     Performed at [facility]   Culture     Final   Value: CULTURE WILL BE EXAMINED FOR 6 WEEKS BEFORE ISSUING A FINAL REPORT     Performed at [facility]   Report Status PENDING   Incomplete  AFB CULTURE WITH SMEAR     Status: None   Collection Time    03/03/14 10:35 AM      Result Value Ref Range Status   Specimen Description ENDOTRACHEAL   Final   Special Requests NONE   Final   Acid Fast Smear     Final   Value: NO ACID FAST BACILLI SEEN     Performed at [facility]   Culture     Final   Value: CULTURE WILL BE EXAMINED FOR 6 WEEKS BEFORE ISSUING A FINAL REPORT     Performed at [facility]   Report Status PENDING   Incomplete  AFB CULTURE WITH SMEAR  Status: None   Collection Time    03/03/14  7:53 PM      Result Value Ref Range Status   Specimen Description ENDOTRACHEAL   Final   Special Requests NONE   Final   Acid Fast Smear     Final   Value: NO ACID  FAST BACILLI SEEN     Performed at [facility]   Culture     Final   Value: CULTURE WILL BE EXAMINED FOR 6 WEEKS BEFORE ISSUING A FINAL REPORT     Performed at [facility]   Report Status PENDING   Incomplete    Medical History: Past Medical History  Diagnosis Date  . Back pain   . Gastritis    Medications (infusions) . sodium chloride 10 mL/hr at 03/08/14 0851  . dexmedetomidine    . fentaNYL infusion INTRAVENOUS 400 mcg/hr (03/08/14 0348)  . phenylephrine (NEO-SYNEPHRINE) Adult infusion Stopped (03/08/14 0740)   Anti-infectives   Start     Dose/Rate Route Frequency Ordered Stop   03/04/14 1300  azithromycin (ZITHROMAX) 500 mg in dextrose 5 % 250 mL IVPB     500 mg 250 mL/hr over 60 Minutes Intravenous Every 24 hours 03/04/14 1203     03/03/14 1230  vancomycin (VANCOCIN) IVPB 750 mg/150 ml premix     750 mg 150 mL/hr over 60 Minutes Intravenous Every 8 hours 03/03/14 0936     03/03/14 0400  vancomycin (VANCOCIN) IVPB 750 mg/150 ml premix  Status:  Discontinued     750 mg 150 mL/hr over 60 Minutes Intravenous Every 12 hours 03/02/14 1617 03/03/14 0936   03/03/14 0200  vancomycin (VANCOCIN) IVPB 750 mg/150 ml premix  Status:  Discontinued     750 mg 150 mL/hr over 60 Minutes Intravenous Every 12 hours 03/02/14 1324 03/02/14 1526   03/02/14 2359  piperacillin-tazobactam (ZOSYN) IVPB 3.375 g  Status:  Discontinued     3.375 g 12.5 mL/hr over 240 Minutes Intravenous 3 times per day 03/02/14 1324 03/02/14 1528   03/02/14 2200  vancomycin (VANCOCIN) IVPB 750 mg/150 ml premix  Status:  Discontinued     750 mg 150 mL/hr over 60 Minutes Intravenous Every 12 hours 03/02/14 1526 03/02/14 1617   03/02/14 1600  piperacillin-tazobactam (ZOSYN) IVPB 3.375 g     3.375 g 12.5 mL/hr over 240 Minutes Intravenous 3 times per day 03/02/14 1528     03/02/14 1545  vancomycin (VANCOCIN) IVPB 1000 mg/200 mL premix     1,000 mg 200 mL/hr over 60 Minutes Intravenous  Once 03/02/14  1530 03/02/14 1707   03/02/14 1245  vancomycin (VANCOCIN) IVPB 1000 mg/200 mL premix  Status:  Discontinued     1,000 mg 200 mL/hr over 60 Minutes Intravenous STAT 03/02/14 1233 03/02/14 1526   03/02/14 1245  piperacillin-tazobactam (ZOSYN) IVPB 3.375 g  Status:  Discontinued     3.375 g 100 mL/hr over 30 Minutes Intravenous STAT 03/02/14 1233 03/02/14 1527     Bernadene Person, PharmD Pager: [phone] 03/08/2014, 9:53 AM

## 2014-03-08 NOTE — Progress Notes (Signed)
Wasted 5 mL of 50 mL bag a 1mg /mL Versed. Wasted in sink. Witnessed by Lezlie Lye, RN

## 2014-03-08 NOTE — Progress Notes (Signed)
Very agitated. Nursing staff holding her down. Does not seem to be responding to up-titrated precedex. As she has already self extubated in the last 24 hrs think we should go ahead and transition back to versed gtt for the time being. Will increase seroquel and hope that this will help.   Anders Simmonds ACNP-BC [facility] Pulmonary/Critical Care Pager # [phone] OR # [phone] if no answer

## 2014-03-08 NOTE — Progress Notes (Signed)
[facility]   Dear [facility]   This patient Lashona Hattabaugh with 20-Aug-1965 is critically ill and is in grave danger and at very very high risk of death in the next few to several days. She is under the care of the [facility] at [facility] in [place], [place]. I am given to understand by the patient's cousin that patient Rozanne Mcmeekin has a daughter  Rodney Dangler with date of birth Sept 23, 1990 be granted an emergency visa to visit her mother (the above patient) immediately.   Please do not hesitate to contact me if you have any questions at any time    Sincerely yours          Dr. Kalman Shan, M.D., [facility].C.P Pulmonary and Critical Care Medicine Staff Physician [facility] [address] [place], 2nd floor Pager: [phone], If no answer or between  15:00h - 7:00h: call [phone] Office Phone: [phone] Office Fax: [phone] Email: [email or web address]  03/08/2014 12:09 PM

## 2014-03-08 NOTE — Progress Notes (Signed)
PULMONARY / CRITICAL CARE MEDICINE   Name: Jaana Greear MRN: [id number] DOB: 09-Aug-1965    ADMISSION DATE:  03/02/2014  HISTORY OF PRESENT ILLNESS:   [name](smoker) who was seen in ED 8/31 with SOB, treated and released. She presented to [facility] ED 9/3 with B infiltrates, acute resp distress and subsequently has respiratory/cardiac arrest with one round of epi/cpr/intubation. She was acidotic but improved with ventilatory support.   STUDIES:  CT-PA 9/3 >> no large PE (exam limited), B LAD, B LL predominant infiltrates and consolidation, small B effusions TTE 9/4/ >> regional LV hypokinesis and dilation, EF 30-35%, dilated LA,   SIGNIFICANT EVENTS: 9/3 resp->cardiac arrest 9/3 intubated. Placed on respiratory isolation given concern about Tb w/ superior scarring in lung.   9/3 family at bedside. She is from [place] and is fighting with her husband. One english speaking daughter. Parents are POA per daughter. 9/5: increased FIO2 needs. Transitioned to ARDS protocol. Vasopressors started.  9/6 AFB negative X 3. But quantiferon gold positive. Spoke w/ ID. Given three negative AFBs resp isolation d/c'd 9/7 sedation increased to tolerate low Vt ventilation. Weaned off pressors.  9/8 attempting to transition to precedex from versed. Starting diuresis per FACT approach  9/8 pm hours self extubated, required re-intubation  9/9 escalating diuretics ala FACT approach. Agitation/dilirium remained a huge barrier. Added low dose clonazepam, low dose clonidine, low dose seroquel and allowed for escalated precedex dosing in order to help wean off versed gtt. Neo ordered to support BP while titrating these meds.   SUBJECTIVE:  Self extubated last night. PCXR looking about the same.   VITAL SIGNS: Temp:  [96.4 F (35.8 C)-100 F (37.8 C)] 99.9 F (37.7 C) (09/09 0600) Pulse Rate:  [77-98] 88 (09/09 0500) Resp:  [23-43] 24 (09/09 0600) BP: (88-140)/(58-99) 88/58 mmHg (09/09 0600) SpO2:  [98  %-100 %] 98 % (09/09 0736) Arterial Line BP: (74-106)/(52-76) 86/65 mmHg (09/08 1800) FiO2 (%):  [40 %] 40 % (09/09 0736) Weight:  [69.3 kg (152 lb 12.5 oz)] 69.3 kg (152 lb 12.5 oz) (09/09 0400) HEMODYNAMICS:   VENTILATOR SETTINGS: Vent Mode:  [-] PRVC FiO2 (%):  [40 %] 40 % Set Rate:  [32 bmp] 32 bmp Vt Set:  [360 mL] 360 mL PEEP:  [5 cmH20] 5 cmH20 Plateau Pressure:  [27 cmH20-31 cmH20] 29 cmH20 INTAKE / OUTPUT:  Intake/Output Summary (Last 24 hours) at 03/08/14 0818 Last data filed at 03/08/14 0600  Gross per 24 hour  Intake 4633.66 ml  Output   4800 ml  Net -166.34 ml    PHYSICAL EXAMINATION: General:  WNWD Hispanic female, MV, agitated at times  Neuro:  Spontaneous eye opening, moving all extremities through sedation/analgesia.  HEENT:  No JVD/LAN, ott->vent Cardiovascular:  ST, RRR Lungs:  Coarse rhonchi bilaterally, no wheeze Abdomen:  Soft,+bs Musculoskeletal:  Intact Skin:  Warm and dry  LABS:  CBC  Recent Labs Lab 03/05/14 0412 03/06/14 0535 03/08/14 0600  WBC 13.4* 10.6* 11.1*  HGB 9.5* 9.3* 9.9*  HCT 28.8* 28.8* 30.1*  PLT 267 298 315   Coag's  Recent Labs Lab 03/02/14 1235  APTT 24  INR 1.08   BMET  Recent Labs Lab 03/06/14 0535 03/07/14 0430 03/08/14 0600  NA 142 139 139  K 3.8 3.6* 3.7  CL 106 102 100  CO2 23 25 26   BUN 7 9 14   CREATININE 0.68 0.65 0.83  GLUCOSE 86 122* 139*   Electrolytes  Recent Labs Lab 03/02/14 1235  03/04/14  0400  03/06/14 0535 03/07/14 0430 03/08/14 0600  CALCIUM  --   < >  --   < > 8.6 8.7 9.1  MG  --   --  1.9  --  1.9  --   --   PHOS 4.4  --  2.0*  --  3.5  --   --   < > = values in this interval not displayed. Sepsis Markers  Recent Labs Lab 03/02/14 1012 03/02/14 1235 03/03/14 0950  LATICACIDVEN 10.62* 2.3* 0.7  PROCALCITON  --  0.17  --    ABG  Recent Labs Lab 03/04/14 1238 03/04/14 1400 03/08/14 0320  PHART 7.253* 7.323* 7.444  PCO2ART 50.7* 42.3 36.4  PO2ART 86.5 253.0*  70.5*   Liver Enzymes  Recent Labs Lab 03/08/14 0600  AST 30  ALT 67*  ALKPHOS 82  BILITOT 0.9  ALBUMIN 2.6*   Cardiac Enzymes  Recent Labs Lab 03/02/14 1002  PROBNP 1799.0*   Glucose  Recent Labs Lab 03/07/14 0757 03/07/14 1158 03/07/14 1700 03/07/14 2009 03/07/14 2352 03/08/14 0420  GLUCAP 91 109* 118* 123* 123* 138*    Imaging Dg Chest Port 1 View  03/07/2014   CLINICAL DATA:  Cough.  Intubation.  EXAM: PORTABLE CHEST - 1 VIEW  COMPARISON:  03/07/2014  FINDINGS: Endotracheal tube tip is localized at the carina. Retraction about 2 cm is recommended. Positioning of the tube is unchanged since previous studies. Enteric tube tip is off the field of view but below the left hemidiaphragm. Left central venous catheter is projected over the mid SVC region. Cardiac enlargement with pulmonary vascular congestion and bilateral parenchymal infiltrates. Findings likely due to edema although pneumonia or ARDS could also have this appearance. Bilateral pleural effusions. No significant change since previous study.  IMPRESSION: Cardiac enlargement with pulmonary vascular congestion and diffuse pulmonary infiltration. Bilateral pleural effusions. Appliances are unchanged in position. The endotracheal tube the tip is again noted at the carina and retraction of about 2 cm is again recommended.   Electronically Signed   By: Burman Nieves M.D.   On: 03/07/2014 22:07   Dg Chest Port 1 View  03/07/2014   CLINICAL DATA:  Intubated patient.  EXAM: PORTABLE CHEST - 1 VIEW  COMPARISON:  03/06/2014  FINDINGS: Endotracheal tube tip lies at the chronic. It appears unchanged from the previous exam.  Hazy bilateral airspace lung opacities, most confluent in the right perihilar and lower lung zone, have increased, some of which is likely due to lower lung volumes on the current exam. There are small bilateral pleural effusions. Scarring and pleural thickening at the apices, greater on the right, is stable.  No pneumothorax.  Left internal jugular central venous line tip is well positioned in the lower superior vena cava. Nasogastric tube tip passes below the diaphragm and below the included field of view.  IMPRESSION: 1. Tip of the endotracheal tube projects at the carina. Consider retracting 1-2 cm for more optimal positioning. 2. Lung aeration appears worsened when compared to the previous day's study although the difference may be due to lower lung volumes on the current exam.   Electronically Signed   By: Amie Portland M.D.   On: 03/07/2014 07:32   Cxr: aeration a little better; but only marginally.   ASSESSMENT / PLAN:  PULMONARY OETT 9/3>>9/8 (self extubated & re-intubated)>>> A: Acute resp failure Multilobar, LL predominant CAP ARDS Posterior scarring, ? Prior infectious process. TB considered, AFB now negative x3 but Quant Gold = positive.  Persistent infiltrates. Minimal improvement but fluid balance only marginally negative.  P:   Continue ARDS protocol;  Push diuresis. Aim for net neg 1 liter minimum  Cont PAD protocol. See neuro section.  Issue of resp isolation difficult > quant gold positive but AFB negative  At this time based on the nature of her infiltrates and the negative AFB it is reasonable to keep her off resp isolation.   CARDIOVASCULAR CVL 9/3 l i j cvl>> Aline 9/3>> A:  Post cardiac arrest secondary resp arrest Shock, likely septic shock from PNA, consider superimposed cardiogenic component (EF 35%) Sedation related hypotension 9/8 on precedex Map >65. Pressors off since 9/7.  P: Allow for negative fluid balance Cont tele monitoring  Will use neo synephrine to support BP on precedex  RENAL A:   Acute renal insufficiency post-arrest, resolved Intermittent Hypokalemia  P:   Follow BMP Replace potassium as needed  GASTROINTESTINAL A:   GI protection Nutrition: Tolerating tube feeding P:   PPI Continue tube feeds,    HEMATOLOGIC A:   No acute  issue P:  Obtain CBC Transfuse for Hgb < 7  INFECTIOUS A:   Presumed CAP Area of superior scarring vs nodularity raises possibility of Tb, AFB negative P:   BCx2  9/3>> UC  9/3>> negative Sputum 9/3>> normal flora Quan gold 9/4>> POSITIVE Sputum AFB 9/3 >> negative Sputum AFB 9/4 >> negative Sputum AFB 9/4 >> negative  Abx: vanc, start date 9/3, day 6/7 Abx: pip/tazo, start date 9/3, day 6/7 Abx: azithro, start date 9/5, day 4/5 Will get PCT, likely d/c all abx tomorrow as planned   ENDOCRINE A:   Hyperglycemia P:   SSI per protocol  NEUROLOGIC A:   Sedation Hx EtOH abuse, withdrawal potential  P:   RASS goal  -2 Will cont precedex and allow ceiling dose of 1.7, add low dose seroquel, clonidine and clonazepam. Cont fentanyl w/ goal to d/c versed all together and then begin fentanyl wean     Today Staff MD summary No sig improvement. Self extubated. Agitation remains the primary barrier in addition to ARDS. Will titrate up her delirium regimen. Push diuresis and hope we can make some headway. If we can come off versed, and decrease her fentanyl needs we should be able to start weaning trials. Rest per NP . MD updated cousing at bedside; emergency visa request letter for daughter given  Critical care time 35 minuts      Anders Simmonds ACNP 03/08/2014 8:18 AM

## 2014-03-08 NOTE — Progress Notes (Signed)
Patient frequently attempting to self- extubate despite precedex and fentanyl drips. PRN versed pushes also given. Patient currently restrained and still tries to self-extubate by sitting up in bed and leaning towards hands. Patient extubated self on previous shift with mittens on. Will continue to monitor closely.

## 2014-03-08 NOTE — Progress Notes (Signed)
Pt self extubated herself. CCM notified.   NG removed and pt was maintained by bag ventilation till anesthesia arrived to re-intubate. After re-intubation the NG was replaced, xray confirmed placement both.

## 2014-03-09 ENCOUNTER — Inpatient Hospital Stay (HOSPITAL_COMMUNITY): Payer: Self-pay

## 2014-03-09 LAB — GLUCOSE, CAPILLARY
GLUCOSE-CAPILLARY: 103 mg/dL — AB (ref 70–99)
Glucose-Capillary: 150 mg/dL — ABNORMAL HIGH (ref 70–99)
Glucose-Capillary: 120 mg/dL — ABNORMAL HIGH (ref 70–99)
Glucose-Capillary: 127 mg/dL — ABNORMAL HIGH (ref 70–99)
Glucose-Capillary: 98 mg/dL (ref 70–99)
Glucose-Capillary: 157 mg/dL — ABNORMAL HIGH (ref 70–99)

## 2014-03-09 LAB — BLOOD GAS, ARTERIAL
ACID-BASE EXCESS: 1.6 mmol/L (ref 0.0–2.0)
BICARBONATE: 25.6 meq/L — AB (ref 20.0–24.0)
Drawn by: 31814
FIO2: 0.3 %
MECHVT: 420 mL
O2 Saturation: 96.6 %
PATIENT TEMPERATURE: 98.2
PEEP: 5 cmH2O
PH ART: 7.426 (ref 7.350–7.450)
RATE: 18 resp/min
TCO2: 23.7 mmol/L (ref 0–100)
pCO2 arterial: 39.6 mmHg (ref 35.0–45.0)
pO2, Arterial: 90.8 mmHg (ref 80.0–100.0)

## 2014-03-09 LAB — COMPREHENSIVE METABOLIC PANEL
ALBUMIN: 2.7 g/dL — AB (ref 3.5–5.2)
ALK PHOS: 88 U/L (ref 39–117)
ALT: 54 U/L — ABNORMAL HIGH (ref 0–35)
AST: 31 U/L (ref 0–37)
Anion gap: 12 (ref 5–15)
BUN: 20 mg/dL (ref 6–23)
CALCIUM: 9.3 mg/dL (ref 8.4–10.5)
CO2: 27 mEq/L (ref 19–32)
Chloride: 100 mEq/L (ref 96–112)
Creatinine, Ser: 1.1 mg/dL (ref 0.50–1.10)
GFR calc non Af Amer: 59 mL/min — ABNORMAL LOW (ref >90)
GFR, EST AFRICAN AMERICAN: 69 mL/min — AB (ref >90)
Glucose, Bld: 176 mg/dL — ABNORMAL HIGH (ref 70–99)
POTASSIUM: 4.9 meq/L (ref 3.7–5.3)
Sodium: 139 mEq/L (ref 137–147)
TOTAL PROTEIN: 7.3 g/dL (ref 6.0–8.3)
Total Bilirubin: 0.8 mg/dL (ref 0.3–1.2)

## 2014-03-09 LAB — CBC
HCT: 30.1 % — ABNORMAL LOW (ref 36.0–46.0)
Hemoglobin: 10 g/dL — ABNORMAL LOW (ref 12.0–15.0)
MCH: 29.8 pg (ref 26.0–34.0)
MCHC: 33.2 g/dL (ref 30.0–36.0)
MCV: 89.6 fL (ref 78.0–100.0)
PLATELETS: 412 10*3/uL — AB (ref 150–400)
RBC: 3.36 MIL/uL — ABNORMAL LOW (ref 3.87–5.11)
RDW: 13.7 % (ref 11.5–15.5)
WBC: 16.3 10*3/uL — ABNORMAL HIGH (ref 4.0–10.5)

## 2014-03-09 LAB — PROCALCITONIN: PROCALCITONIN: 0.29 ng/mL

## 2014-03-09 MED ORDER — FUROSEMIDE 10 MG/ML IJ SOLN
40.0000 mg | Freq: Two times a day (BID) | INTRAMUSCULAR | Status: AC
  Administered 2014-03-09: 20 mg via INTRAVENOUS
  Administered 2014-03-09: 40 mg via INTRAVENOUS
  Filled 2014-03-09 (×2): qty 4

## 2014-03-09 MED ORDER — PHENYLEPHRINE HCL 10 MG/ML IJ SOLN
30.0000 ug/min | INTRAVENOUS | Status: DC
  Administered 2014-03-09: 50 ug/min via INTRAVENOUS
  Administered 2014-03-10: 60 ug/min via INTRAVENOUS
  Administered 2014-03-10: 35 ug/min via INTRAVENOUS
  Filled 2014-03-09 (×3): qty 4

## 2014-03-09 MED ORDER — CLONIDINE HCL 0.1 MG PO TABS
0.2000 mg | ORAL_TABLET | Freq: Two times a day (BID) | ORAL | Status: DC
  Filled 2014-03-09: qty 2

## 2014-03-09 MED ORDER — CLONAZEPAM 1 MG PO TABS
1.0000 mg | ORAL_TABLET | Freq: Two times a day (BID) | ORAL | Status: DC
  Administered 2014-03-09 – 2014-03-11 (×5): 1 mg via ORAL
  Filled 2014-03-09 (×5): qty 1

## 2014-03-09 MED ORDER — QUETIAPINE FUMARATE 100 MG PO TABS
100.0000 mg | ORAL_TABLET | Freq: Two times a day (BID) | ORAL | Status: DC
  Administered 2014-03-09 – 2014-03-13 (×8): 100 mg
  Filled 2014-03-09 (×8): qty 1

## 2014-03-09 NOTE — Progress Notes (Signed)
CARE MANAGE MENT UTILIZATION REVIEW NOTE 03/09/2014     Patient:  [name] [name],[name] [name]   Account Number:  [id number]  Documented by:  GOTLXB Keng Jewel   Per Ur Regulation Reviewed for med. necessity/level of care/duration of stay/iv sedation and full vent support.

## 2014-03-09 NOTE — Progress Notes (Signed)
eLink Physician-Brief Progress Note Patient Name: [name] [name] DOB: 1966-03-26 MRN: [id number]   Date of Service  03/09/2014  HPI/Events of Note  Clonidine prescribed to help with agitation However, patient remains on neosynephrine  Goal today is net negative -1L I/O balance  eICU Interventions  Hold clonidine     Intervention Category Minor Interventions: Agitation / anxiety - evaluation and management  [name] 03/09/2014, 11:50 PM

## 2014-03-09 NOTE — Progress Notes (Signed)
eLink Physician-Brief Progress Note Patient Name: [name] [name] DOB: 29-Apr-1966 MRN: [id number]   Date of Service  03/09/2014  HPI/Events of Note  Improved oxygenation (PEEP 5 for nearly 72 hours) Don't see need for set RR on vent 32 Agitation noted from earlier today, requiring high sedation  eICU Interventions  D/C ARDS protocol Decrease RR to 18 for comfort, change TVol to 7cc/kg repeat ABG in AM     Intervention Category Major Interventions: Respiratory failure - evaluation and management  MCQUAID, DOUGLAS 03/09/2014, 12:03 AM

## 2014-03-09 NOTE — Progress Notes (Addendum)
PULMONARY / CRITICAL CARE MEDICINE   Name: [name] [name] MRN: [id number] DOB: 03-24-1966    ADMISSION DATE:  03/02/2014  HISTORY OF PRESENT ILLNESS:   [name](smoker) who was seen in ED 8/31 with SOB, treated and released. She presented to [facility] ED 9/3 with B infiltrates, acute resp distress and subsequently has respiratory/cardiac arrest with one round of epi/cpr/intubation. She was acidotic but improved with ventilatory support.   STUDIES:  CT-PA 9/3 >> no large PE (exam limited), B LAD, B LL predominant infiltrates and consolidation, small B effusions TTE 9/4/ >> regional LV hypokinesis and dilation, EF 30-35%, dilated LA,   SIGNIFICANT EVENTS: 9/3 resp->cardiac arrest 9/3 intubated. Placed on respiratory isolation given concern about Tb w/ superior scarring in lung.   9/3 family at bedside. She is from [place] and is fighting with her husband. One english speaking daughter. Parents are POA per daughter. 9/5: increased FIO2 needs. Transitioned to ARDS protocol. Vasopressors started.  9/6 AFB negative X 3. But quantiferon gold positive. Spoke w/ ID. Given three negative AFBs resp isolation d/c'd 9/7 sedation increased to tolerate low Vt ventilation. Weaned off pressors.  9/8 attempting to transition to precedex from versed. Starting diuresis per FACT approach  9/8 pm hours self extubated, required re-intubation  9/9 escalating diuretics ala FACT approach. Agitation/dilirium remained a huge barrier. Added low dose clonazepam, low dose clonidine, low dose seroquel and allowed for escalated precedex dosing in order to help wean off versed gtt. Neo ordered to support BP while titrating these meds. Failed precedex in the afternoon and placed back on versed w/ escalated seroquel  9/10 All culture data negative. PCT NML. ABX (vanc/zosyn and azithro) stopped  SUBJECTIVE:  Self extubated last night. PCXR looking about the same.   VITAL SIGNS: Temp:  [98.2 F (36.8 C)-100.2 F (37.9 C)]  98.8 F (37.1 C) (09/10 1000) Pulse Rate:  [79-135] 81 (09/10 1000) Resp:  [15-34] 15 (09/10 1000) BP: (73-130)/(52-81) 112/72 mmHg (09/10 1045) SpO2:  [80 %-100 %] 100 % (09/10 1139) FiO2 (%):  [30 %-35 %] 30 % (09/10 1141) Weight:  [63.1 kg (139 lb 1.8 oz)] 63.1 kg (139 lb 1.8 oz) (09/10 0459) HEMODYNAMICS:   VENTILATOR SETTINGS: Vent Mode:  [-] PRVC FiO2 (%):  [30 %-35 %] 30 % Set Rate:  [18 bmp-32 bmp] 18 bmp Vt Set:  [360 mL-420 mL] 420 mL PEEP:  [5 cmH20] 5 cmH20 Plateau Pressure:  [24 cmH20-26 cmH20] 26 cmH20 INTAKE / OUTPUT:  Intake/Output Summary (Last 24 hours) at 03/09/14 1152 Last data filed at 03/09/14 1000  Gross per 24 hour  Intake 4364.66 ml  Output   6475 ml  Net -2110.34 ml    PHYSICAL EXAMINATION: General:  WNWD Hispanic female, MV, remains agitated at times  Neuro:  Spontaneous eye opening, moving all extremities through sedation/analgesia. Does not f/c HEENT:  No JVD/LAN, ott->vent Cardiovascular:  ST, RRR Lungs:  Coarse rhonchi bilaterally, no wheeze Abdomen:  Soft,+bs Musculoskeletal:  Intact Skin:  Warm and dry  LABS:  CBC  Recent Labs Lab 03/06/14 0535 03/08/14 0600 03/09/14 0525  WBC 10.6* 11.1* 16.3*  HGB 9.3* 9.9* 10.0*  HCT 28.8* 30.1* 30.1*  PLT 298 315 412*   Coag's  Recent Labs Lab 03/02/14 1235  APTT 24  INR 1.08   BMET  Recent Labs Lab 03/07/14 0430 03/08/14 0600 03/09/14 0525  NA 139 139 139  K 3.6* 3.7 4.9  CL 102 100 100  CO2 25 26 27   BUN  CREATININE 0.65 0.83 1.10  GLUCOSE 122* 139* 176*   Electrolytes  Recent Labs Lab 03/02/14 1235  03/04/14 0400  03/06/14 0535 03/07/14 0430 03/08/14 0600 03/09/14 0525  CALCIUM  --   < >  --   < > 8.6 8.7 9.1 9.3  MG  --   --  1.9  --  1.9  --   --   --   PHOS 4.4  --  2.0*  --  3.5  --   --   --   < > = values in this interval not displayed. Sepsis Markers  Recent Labs Lab 03/02/14 1235 03/03/14 0950 03/08/14 0600 03/09/14 0520   LATICACIDVEN 2.3* 0.7  --   --   PROCALCITON 0.17  --  0.58 0.29   ABG  Recent Labs Lab 03/04/14 1400 03/08/14 0320 03/09/14 0600  PHART 7.323* 7.444 7.426  PCO2ART 42.3 36.4 39.6  PO2ART 253.0* 70.5* 90.8   Liver Enzymes  Recent Labs Lab 03/08/14 0600 03/09/14 0525  AST 30 31  ALT 67* 54*  ALKPHOS 82 88  BILITOT 0.9 0.8  ALBUMIN 2.6* 2.7*   Cardiac Enzymes No results found for this basename: TROPONINI, PROBNP,  in the last 168 hours Glucose  Recent Labs Lab 03/08/14 0757 03/08/14 1211 03/08/14 1621 03/08/14 2003 03/08/14 2344 03/09/14 0303  GLUCAP 141* 193* 146* 124* 151* 150*    Imaging Dg Chest Port 1 View  03/08/2014   CLINICAL DATA:  Check endotracheal tube position  EXAM: PORTABLE CHEST - 1 VIEW  COMPARISON:  03/07/2014  FINDINGS: Endotracheal tube now lies 1 cm above the carina and is relatively stable from the prior exam. The cardiac shadow is stable. A left jugular central line and nasogastric catheter are again seen. Bilateral basilar infiltrates are seen as well as apical changes on the right. The overall appearance is stable.  IMPRESSION: Stable bilateral infiltrates.  Tubes and lines as described above. Again the endotracheal tube could be withdrawn 1-2 cm.   Electronically Signed   By: Alcide Clever M.D.   On: 03/08/2014 07:36   Cxr: aeration a little better; but only marginally.   ASSESSMENT / PLAN:  PULMONARY OETT 9/3>>9/8 (self extubated & re-intubated)>>> A: Acute resp failure Multilobar, LL predominant CAP ARDS Posterior scarring, ? Prior infectious process. TB considered, AFB now negative x3 but Quant Gold = positive.  Persistent infiltrates.  P:   Cont full vent support. Hope to start weaning when MS will allow  May need trach  Push diuresis. Aim for net neg 1 liter minimum  Cont PAD protocol. See neuro section.    CARDIOVASCULAR CVL 9/3 l i j cvl>> Aline 9/3>> A:  Post cardiac arrest secondary resp arrest Shock, likely septic  shock from PNA, consider superimposed cardiogenic component (EF 35%) Sedation related hypotension 9/8 on precedex Map >65. Pressors off since 9/7.  P: Allow for negative fluid balance Cont tele monitoring   RENAL A:   Acute renal insufficiency post-arrest, resolved Intermittent Hypokalemia  P:   Follow BMP Replace potassium as needed  GASTROINTESTINAL A:   GI protection Nutrition: Tolerating tube feeding P:   PPI Continue tube feeds   HEMATOLOGIC A:   No acute issue P:  Obtain CBC Transfuse for Hgb < 7  INFECTIOUS A:   Presumed CAP Area of superior scarring vs nodularity raises possibility of Tb, AFB negative P:   BCx2  9/3>>neg UC  9/3>> negative Sputum 9/3>> normal flora Quan gold 9/4>>  POSITIVE Sputum AFB 9/3 >> negative Sputum AFB 9/4 >> negative Sputum AFB 9/4 >> negative  Abx: vanc, start date 9/3, day 6/7 Abx: pip/tazo, start date 9/3, day 6/7 Abx: azithro, start date 9/5, day 4/5 D/c all abx today   ENDOCRINE A:   Hyperglycemia P:   SSI per protocol  NEUROLOGIC A:   Acute encephalopathy. Concerned about hypoxic injury  Hx EtOH abuse, withdrawal potential  Failed precedex  P:   RASS goal  -2 Have up-titrated seroquel, clonazepam and clonidine.  Might consier CT head   TODaY STAFF MD SUMMAR Agitation is the biggest issue at this point. Will cont diuresis. Have increased seroquel. Hope that we can begin weaning if we can get sedation down. Concerned about anoxic injury. She may need trach. Rest per NP  Critical care time x 35 minutes  Anders Simmonds ACNP 03/09/2014 11:52 AM   Dr. Kalman Shan, M.D., [facility].C.P Pulmonary and Critical Care Medicine Staff Physician [facility] [place] Pulmonary and Critical Care Pager: [phone], If no answer or between  15:00h - 7:00h: call [phone]  03/09/2014 5:55 PM

## 2014-03-10 ENCOUNTER — Inpatient Hospital Stay (HOSPITAL_COMMUNITY): Payer: Self-pay

## 2014-03-10 LAB — COMPREHENSIVE METABOLIC PANEL
ALK PHOS: 86 U/L (ref 39–117)
ALT: 55 U/L — AB (ref 0–35)
ANION GAP: 14 (ref 5–15)
AST: 38 U/L — AB (ref 0–37)
Albumin: 2.9 g/dL — ABNORMAL LOW (ref 3.5–5.2)
BILIRUBIN TOTAL: 0.7 mg/dL (ref 0.3–1.2)
BUN: 26 mg/dL — AB (ref 6–23)
CHLORIDE: 95 meq/L — AB (ref 96–112)
CO2: 29 mEq/L (ref 19–32)
Calcium: 9.6 mg/dL (ref 8.4–10.5)
Creatinine, Ser: 1.12 mg/dL — ABNORMAL HIGH (ref 0.50–1.10)
GFR calc Af Amer: 67 mL/min — ABNORMAL LOW (ref >90)
GFR calc non Af Amer: 58 mL/min — ABNORMAL LOW (ref >90)
Glucose, Bld: 140 mg/dL — ABNORMAL HIGH (ref 70–99)
POTASSIUM: 4.1 meq/L (ref 3.7–5.3)
Sodium: 138 mEq/L (ref 137–147)
TOTAL PROTEIN: 7.7 g/dL (ref 6.0–8.3)

## 2014-03-10 LAB — CBC
HEMATOCRIT: 29.6 % — AB (ref 36.0–46.0)
Hemoglobin: 9.8 g/dL — ABNORMAL LOW (ref 12.0–15.0)
MCH: 29.5 pg (ref 26.0–34.0)
MCHC: 33.1 g/dL (ref 30.0–36.0)
MCV: 89.2 fL (ref 78.0–100.0)
Platelets: 472 10*3/uL — ABNORMAL HIGH (ref 150–400)
RBC: 3.32 MIL/uL — ABNORMAL LOW (ref 3.87–5.11)
RDW: 14 % (ref 11.5–15.5)
WBC: 19.3 10*3/uL — AB (ref 4.0–10.5)

## 2014-03-10 LAB — GLUCOSE, CAPILLARY
GLUCOSE-CAPILLARY: 148 mg/dL — AB (ref 70–99)
GLUCOSE-CAPILLARY: 145 mg/dL — AB (ref 70–99)
GLUCOSE-CAPILLARY: 136 mg/dL — AB (ref 70–99)
Glucose-Capillary: 147 mg/dL — ABNORMAL HIGH (ref 70–99)
Glucose-Capillary: 164 mg/dL — ABNORMAL HIGH (ref 70–99)

## 2014-03-10 LAB — MAGNESIUM: Magnesium: 2.2 mg/dL (ref 1.5–2.5)

## 2014-03-10 LAB — PHOSPHORUS: Phosphorus: 4.8 mg/dL — ABNORMAL HIGH (ref 2.3–4.6)

## 2014-03-10 LAB — PROCALCITONIN: Procalcitonin: 0.25 ng/mL

## 2014-03-10 MED ORDER — DOCUSATE SODIUM 50 MG/5ML PO LIQD
100.0000 mg | Freq: Two times a day (BID) | ORAL | Status: DC
  Administered 2014-03-10 (×2): 100 mg
  Filled 2014-03-10 (×6): qty 10

## 2014-03-10 MED ORDER — INFLUENZA VAC SPLIT QUAD 0.5 ML IM SUSY
0.5000 mL | PREFILLED_SYRINGE | INTRAMUSCULAR | Status: DC
  Filled 2014-03-10 (×2): qty 0.5

## 2014-03-10 NOTE — Progress Notes (Signed)
PULMONARY / CRITICAL CARE MEDICINE   Name: [name] [name] MRN: [id number] DOB: 06-29-1966    ADMISSION DATE:  03/02/2014  HISTORY OF PRESENT ILLNESS:   [name](smoker) who was seen in ED 8/31 with SOB, treated and released. She presented to [facility] ED 9/3 with B infiltrates, acute resp distress and subsequently has respiratory/cardiac arrest with one round of epi/cpr/intubation. She was acidotic but improved with ventilatory support.   STUDIES:  CT-PA 9/3 >> no large PE (exam limited), B LAD, B LL predominant infiltrates and consolidation, small B effusions TTE 9/4/ >> regional LV hypokinesis and dilation, EF 30-35%, dilated LA,   SIGNIFICANT EVENTS: 9/3 resp->cardiac arrest 9/3 intubated. Placed on respiratory isolation given concern about Tb w/ superior scarring in lung.   9/3 family at bedside. She is from [place] and is fighting with her husband. One english speaking daughter. Parents are POA per daughter. 9/5: increased FIO2 needs. Transitioned to ARDS protocol. Vasopressors started.  9/6 AFB negative X 3. But quantiferon gold positive. Spoke w/ ID. Given three negative AFBs resp isolation d/c'd 9/7 sedation increased to tolerate low Vt ventilation. Weaned off pressors.  9/8 attempting to transition to precedex from versed. Starting diuresis per FACT approach  9/8 pm hours self extubated, required re-intubation  9/9 escalating diuretics ala FACT approach. Agitation/dilirium remained a huge barrier. Added low dose clonazepam, low dose clonidine, low dose seroquel and allowed for escalated precedex dosing in order to help wean off versed gtt. Neo ordered to support BP while titrating these meds. Failed precedex in the afternoon and placed back on versed w/ escalated seroquel  9/10 All culture data negative. PCT NML. ABX (vanc/zosyn and azithro) stopped 9/11 CT head negative  SUBJECTIVE:  Still sedated.   VITAL SIGNS: Temp:  [97.7 F (36.5 C)-100.2 F (37.9 C)] 99.7 F (37.6 C)  (09/11 0830) Pulse Rate:  [81-127] 96 (09/11 0830) Resp:  [13-54] 18 (09/11 0830) BP: (71-149)/(37-89) 111/70 mmHg (09/11 0830) SpO2:  [95 %-100 %] 98 % (09/11 0830) FiO2 (%):  [30 %] 30 % (09/11 0804) Weight:  [63.1 kg (139 lb 1.8 oz)] 63.1 kg (139 lb 1.8 oz) (09/11 0430) HEMODYNAMICS:   VENTILATOR SETTINGS: Vent Mode:  [-] PRVC FiO2 (%):  [30 %] 30 % Set Rate:  [18 bmp] 18 bmp Vt Set:  [420 mL] 420 mL PEEP:  [5 cmH20] 5 cmH20 Plateau Pressure:  [24 cmH20-26 cmH20] 24 cmH20 INTAKE / OUTPUT:  Intake/Output Summary (Last 24 hours) at 03/10/14 0926 Last data filed at 03/10/14 0600  Gross per 24 hour  Intake 2801.15 ml  Output   2902 ml  Net -100.85 ml    PHYSICAL EXAMINATION: General:  WNWD Hispanic female, remains agitated at times  Neuro:  Spontaneous eye opening, moving all extremities through sedation/analgesia. Does not f/c HEENT:  No JVD/LAN, ott->vent Cardiovascular:  ST, RRR  Lungs:  Diminished bilaterally, no wheeze Abdomen: tight and distended. Nontender. +BS Musculoskeletal:  Intact Skin:  Warm and dry  LABS:  CBC  Recent Labs Lab 03/08/14 0600 03/09/14 0525 03/10/14 0400  WBC 11.1* 16.3* 19.3*  HGB 9.9* 10.0* 9.8*  HCT 30.1* 30.1* 29.6*  PLT 315 412* 472*   Coag's No results found for this basename: APTT, INR,  in the last 168 hours BMET  Recent Labs Lab 03/08/14 0600 03/09/14 0525 03/10/14 0400  NA 139 139 138  K 3.7 4.9 4.1  CL 100 100 95*  CO2 26 27 29   BUN 14 20 26*  CREATININE  0.83 1.10 1.12*  GLUCOSE 139* 176* 140*   Electrolytes  Recent Labs Lab 03/04/14 0400  03/06/14 0535  03/08/14 0600 03/09/14 0525 03/10/14 0400  CALCIUM  --   < > 8.6  < > 9.1 9.3 9.6  MG 1.9  --  1.9  --   --   --  2.2  PHOS 2.0*  --  3.5  --   --   --  4.8*  < > = values in this interval not displayed. Sepsis Markers  Recent Labs Lab 03/03/14 0950 03/08/14 0600 03/09/14 0520 03/10/14 0400  LATICACIDVEN 0.7  --   --   --   PROCALCITON  --   0.58 0.29 0.25   ABG  Recent Labs Lab 03/04/14 1400 03/08/14 0320 03/09/14 0600  PHART 7.323* 7.444 7.426  PCO2ART 42.3 36.4 39.6  PO2ART 253.0* 70.5* 90.8   Liver Enzymes  Recent Labs Lab 03/08/14 0600 03/09/14 0525 03/10/14 0400  AST 30 31 38*  ALT 67* 54* 55*  ALKPHOS 82 88 86  BILITOT 0.9 0.8 0.7  ALBUMIN 2.6* 2.7* 2.9*   Cardiac Enzymes No results found for this basename: TROPONINI, PROBNP,  in the last 168 hours Glucose  Recent Labs Lab 03/09/14 0754 03/09/14 1145 03/09/14 1545 03/09/14 1914 03/09/14 2324 03/10/14 0335  GLUCAP 120* 103* 127* 98 157* 148*    Imaging Ct Head Wo Contrast  03/10/2014   CLINICAL DATA:  Encephalopathy, altered mental status.  EXAM: CT HEAD WITHOUT CONTRAST  TECHNIQUE: Contiguous axial images were obtained from the base of the skull through the vertex without intravenous contrast.  COMPARISON:  None.  FINDINGS: The ventricles and sulci are normal. No intraparenchymal hemorrhage, mass effect nor midline shift. No acute large vascular territory infarcts.  No abnormal extra-axial fluid collections. Basal cisterns are patent.  No skull fracture. The included ocular globes and orbital contents are non-suspicious. The mastoid aircells and included paranasal sinuses are well-aerated. Life support lines seen on the scout view.  IMPRESSION: No acute intracranial process.  Normal noncontrast CT of the head.   Electronically Signed   By: Awilda Metro   On: 03/10/2014 01:01   Dg Chest Port 1 View  03/09/2014   CLINICAL DATA:  Decreased oxygen saturation  EXAM: PORTABLE CHEST - 1 VIEW  COMPARISON:  03/08/2014  FINDINGS: An endotracheal tube is again identified with the tip 10 mm above the carina. It is stable in appearance. A left central venous line is again noted and stable. Nasogastric catheter is coiled within the stomach. Bilateral nipple shadows are seen. Bilateral infiltrates are noted with improved aeration particularly in the left lung  base.  IMPRESSION: Mild infiltrates are again identified bilaterally. Improved aeration is noted.  The endotracheal tube still lies low within the trachea and could be withdrawn 1-2 cm. No new focal abnormality is seen.   Electronically Signed   By: Alcide Clever M.D.   On: 03/09/2014 20:22   Cxr: aeration a little better; but only marginally.   ASSESSMENT / PLAN:  PULMONARY OETT 9/3>>9/8 (self extubated & re-intubated)>>> A: Acute resp failure Multilobar, LL predominant CAP ARDS Posterior scarring, ? Prior infectious process. TB considered, AFB now negative x3 but Quant Gold = positive.  Persistent infiltrates.   P:   CXR order this AM Cont full vent support. Hope to start weaning when MS will allow; May need trach  Cont PAD protocol. See neuro section.    CARDIOVASCULAR CVL 9/3 l i j cvl>> Aline 9/3>> A:  Post cardiac arrest secondary resp arrest Shock, likely septic shock from PNA, consider superimposed cardiogenic component (EF 35%) Sedation related hypotension 9/8 on precedex Map >65. Pressors off since 9/7.  P: Allow for negative fluid balance Cont tele monitoring   RENAL A:   Acute renal insufficiency post-arrest, resolved Scr rising w/ diuresis.  P:   Hold PM lasix; Follow BMP.Marland Kitchen Allow volume status to equilibrate   GASTROINTESTINAL A:   GI protection Nutrition: Tolerating tube feeding  abd distention  P:   PPI Continue tube feeds Flat plat abd  Cont tubefeeds  Add stool softer given high dose narcotics   HEMATOLOGIC A:   Anemia of chronic disease P:  Trend CBC every day Transfuse for Hgb < 7  INFECTIOUS A:   Presumed CAP Area of superior scarring vs nodularity raises possibility of Tb, AFB negative Leukocytosis remains All cultures negative as of 9/11 except noted + quan gold  P:  Currently off all ATB; culture when >38.3/100.9  ENDOCRINE A:   Hyperglycemia P:   SSI per protocol  NEUROLOGIC A:   Acute encephalopathy. Negative CT Hx  EtOH abuse, withdrawal potential  Failed precedex  P:   RASS goal  -2 Have up-titrated seroquel, clonazepam Will likely need trach   Anders Simmonds ACNP 9:26 AM 03/10/14  TODaY STAFF MD SUMMAR  Agitation is the biggest issue at this point. Will cont diuresis. Have increased seroquel. Hope that we can begin weaning if we can get sedation down. Concerned about anoxic injury but not evident on Ct. In fact 9/11/5 she has some improvement so we are hopeful. She may need trach. Rest per NP    Critical care time x 35 minutes   Dr. Kalman Shan, M.D., [facility].C.P Pulmonary and Critical Care Medicine Staff Physician [facility]  Pulmonary and Critical Care Pager: [phone], If no answer or between  15:00h - 7:00h: call [phone]  03/10/14 11:00am  (signed only on 9/12/15_

## 2014-03-11 ENCOUNTER — Inpatient Hospital Stay (HOSPITAL_COMMUNITY): Payer: Self-pay

## 2014-03-11 DIAGNOSIS — K560 Paralytic ileus: Secondary | ICD-10-CM

## 2014-03-11 LAB — COMPREHENSIVE METABOLIC PANEL
ALK PHOS: 89 U/L (ref 39–117)
ALT: 46 U/L — ABNORMAL HIGH (ref 0–35)
AST: 27 U/L (ref 0–37)
Albumin: 3 g/dL — ABNORMAL LOW (ref 3.5–5.2)
Anion gap: 13 (ref 5–15)
BUN: 26 mg/dL — ABNORMAL HIGH (ref 6–23)
CALCIUM: 9.9 mg/dL (ref 8.4–10.5)
CO2: 27 mEq/L (ref 19–32)
Chloride: 99 mEq/L (ref 96–112)
Creatinine, Ser: 0.79 mg/dL (ref 0.50–1.10)
GFR calc Af Amer: >90 mL/min (ref >90)
GFR calc non Af Amer: >90 mL/min (ref >90)
Glucose, Bld: 144 mg/dL — ABNORMAL HIGH (ref 70–99)
POTASSIUM: 4.7 meq/L (ref 3.7–5.3)
SODIUM: 139 meq/L (ref 137–147)
Total Bilirubin: 0.7 mg/dL (ref 0.3–1.2)
Total Protein: 8.2 g/dL (ref 6.0–8.3)

## 2014-03-11 LAB — GLUCOSE, CAPILLARY
GLUCOSE-CAPILLARY: 159 mg/dL — AB (ref 70–99)
GLUCOSE-CAPILLARY: 129 mg/dL — AB (ref 70–99)
GLUCOSE-CAPILLARY: 91 mg/dL (ref 70–99)
Glucose-Capillary: 155 mg/dL — ABNORMAL HIGH (ref 70–99)
Glucose-Capillary: 147 mg/dL — ABNORMAL HIGH (ref 70–99)
Glucose-Capillary: 87 mg/dL (ref 70–99)

## 2014-03-11 LAB — CBC
HCT: 31.2 % — ABNORMAL LOW (ref 36.0–46.0)
HEMOGLOBIN: 10 g/dL — AB (ref 12.0–15.0)
MCH: 29.9 pg (ref 26.0–34.0)
MCHC: 32.1 g/dL (ref 30.0–36.0)
MCV: 93.4 fL (ref 78.0–100.0)
PLATELETS: 503 10*3/uL — AB (ref 150–400)
RBC: 3.34 MIL/uL — ABNORMAL LOW (ref 3.87–5.11)
RDW: 14.4 % (ref 11.5–15.5)
WBC: 17.8 10*3/uL — AB (ref 4.0–10.5)

## 2014-03-11 LAB — PHOSPHORUS: Phosphorus: 3.4 mg/dL (ref 2.3–4.6)

## 2014-03-11 LAB — MAGNESIUM: MAGNESIUM: 2.5 mg/dL (ref 1.5–2.5)

## 2014-03-11 MED ORDER — MIDAZOLAM HCL 2 MG/2ML IJ SOLN
1.0000 mg | Freq: Once | INTRAMUSCULAR | Status: AC
  Administered 2014-03-11: 2 mg via INTRAVENOUS

## 2014-03-11 MED ORDER — HYDROMORPHONE HCL PF 10 MG/ML IJ SOLN
0.5000 mg/h | INTRAMUSCULAR | Status: DC
  Administered 2014-03-11: 2 mg/h via INTRAVENOUS
  Administered 2014-03-11: 1.5 mg/h via INTRAVENOUS
  Administered 2014-03-11 – 2014-03-12 (×2): 3 mg/h via INTRAVENOUS
  Filled 2014-03-11 (×3): qty 5

## 2014-03-11 MED ORDER — PROPOFOL 10 MG/ML IV EMUL
INTRAVENOUS | Status: AC
  Administered 2014-03-11: 21:00:00
  Filled 2014-03-11: qty 100

## 2014-03-11 MED ORDER — PROPOFOL 10 MG/ML IV EMUL
5.0000 ug/kg/min | INTRAVENOUS | Status: DC
  Administered 2014-03-11: 30 ug/kg/min via INTRAVENOUS
  Administered 2014-03-12: 35 ug/kg/min via INTRAVENOUS
  Administered 2014-03-12 (×2): 40 ug/kg/min via INTRAVENOUS
  Administered 2014-03-13: 30 ug/kg/min via INTRAVENOUS
  Filled 2014-03-11 (×5): qty 100

## 2014-03-11 MED ORDER — FUROSEMIDE 10 MG/ML IJ SOLN
40.0000 mg | Freq: Four times a day (QID) | INTRAMUSCULAR | Status: AC
  Administered 2014-03-11 (×2): 40 mg via INTRAVENOUS
  Filled 2014-03-11 (×2): qty 4

## 2014-03-11 MED ORDER — IOHEXOL 300 MG/ML  SOLN
50.0000 mL | Freq: Once | INTRAMUSCULAR | Status: AC | PRN
  Administered 2014-03-11: 50 mL via ORAL

## 2014-03-11 MED ORDER — INFLUENZA VAC SPLIT QUAD 0.5 ML IM SUSY
0.5000 mL | PREFILLED_SYRINGE | Freq: Once | INTRAMUSCULAR | Status: AC
  Administered 2014-03-14: 0.5 mL via INTRAMUSCULAR
  Filled 2014-03-11 (×4): qty 0.5

## 2014-03-11 NOTE — Progress Notes (Signed)
Versed 20cc wasted in sink with [place].

## 2014-03-11 NOTE — Progress Notes (Signed)
eLink Physician-Brief Progress Note Patient Name: Samah Didion DOB: 1965/12/04 MRN: [id number]   Date of Service  03/11/2014  HPI/Events of Note  Agitation on versed drip  eICU Interventions  Change to propofol for prob etoh w/d      Intervention Category Major Interventions: Delirium, psychosis, severe agitation - evaluation and management  Sandrea Hughs 03/11/2014, 8:58 PM

## 2014-03-11 NOTE — Progress Notes (Signed)
95cc of Fentanyl drip d/c'd per order.  Witnessed by Lorrin Jackson RN and Leighton Parody RN

## 2014-03-11 NOTE — Progress Notes (Signed)
   RN concerned about abdominal distension Tolerating tube feeds  Plan KUB   Dr. Kalman Shan, M.D., [facility].C.P Pulmonary and Critical Care Medicine Staff Physician [facility] [place] Pulmonary and Critical Care Pager: [phone], If no answer or between  15:00h - 7:00h: call [phone]  03/11/2014 5:38 AM

## 2014-03-11 NOTE — Progress Notes (Signed)
PULMONARY / CRITICAL CARE MEDICINE   Name: [name] [name] MRN: [id number] DOB: 06/21/1966    ADMISSION DATE:  03/02/2014  HISTORY OF PRESENT ILLNESS:   [name](smoker) who was seen in ED 8/31 with SOB, treated and released. She presented to [facility] ED 9/3 with B infiltrates, acute resp distress and subsequently has respiratory/cardiac arrest with one round of epi/cpr/intubation. She was acidotic but improved with ventilatory support.   STUDIES:  CT-PA 9/3 >> no large PE (exam limited), B LAD, B LL predominant infiltrates and consolidation, small B effusions TTE 9/4/ >> regional LV hypokinesis and dilation, EF 30-35%, dilated LA,  9/11 CT head negative 9/12 CT abdomen >>   SIGNIFICANT EVENTS: 9/3 resp->cardiac arrest 9/3 intubated. Placed on respiratory isolation given concern about Tb w/ superior scarring in lung.   9/3 family at bedside. She is from [place] and is fighting with her husband. One english speaking daughter. Parents are POA per daughter. 9/5: increased FIO2 needs. Transitioned to ARDS protocol. Vasopressors started.  9/6 AFB negative X 3. But quantiferon gold positive. Spoke w/ ID. Given three negative AFBs resp isolation d/c'd 9/7 sedation increased to tolerate low Vt ventilation. Weaned off pressors.  9/8 attempting to transition to precedex from versed. Starting diuresis per FACT approach  9/8 pm hours self extubated, required re-intubation  9/9 escalating diuretics ala FACT approach. Agitation/dilirium remained a huge barrier. Added low dose clonazepam, low dose clonidine, low dose seroquel and allowed for escalated precedex dosing in order to help wean off versed gtt. Neo ordered to support BP while titrating these meds. Failed precedex in the afternoon and placed back on versed w/ escalated seroquel  9/10 All culture data negative. PCT NML. ABX (vanc/zosyn and azithro) stopped  SUBJECTIVE:  Markedly agitated on high dose fentanyl and versed; abdomen very distended  and tight  VITAL SIGNS: Temp:  [99 F (37.2 C)-100.4 F (38 C)] 99 F (37.2 C) (09/12 0630) Pulse Rate:  [94-139] 109 (09/12 0630) Resp:  [16-34] 19 (09/12 0630) BP: (88-156)/(56-114) 123/82 mmHg (09/12 0630) SpO2:  [95 %-100 %] 97 % (09/12 0630) FiO2 (%):  [30 %] 30 % (09/12 0630) Weight:  [63.3 kg (139 lb 8.8 oz)] 63.3 kg (139 lb 8.8 oz) (09/12 0500) HEMODYNAMICS:   VENTILATOR SETTINGS: Vent Mode:  [-] PRVC FiO2 (%):  [30 %] 30 % Set Rate:  [18 bmp] 18 bmp Vt Set:  [420 mL] 420 mL PEEP:  [5 cmH20] 5 cmH20 Plateau Pressure:  [24 cmH20-33 cmH20] 33 cmH20 INTAKE / OUTPUT:  Intake/Output Summary (Last 24 hours) at 03/11/14 0754 Last data filed at 03/11/14 0643  Gross per 24 hour  Intake 2237.27 ml  Output   1540 ml  Net 697.27 ml    PHYSICAL EXAMINATION: General:  Markedly agitated on vent HEENT: NCAT, PERRL PULM: few crackles bases, otherwise clear CV: Tachy, regular, no clear murmur AB: markedly distended, tight, minimal bowel sounds Ext: warm, no edema Neuro: Agitated, opens eyes, reaches for tube, will not focus or follow commands  LABS:  CBC  Recent Labs Lab 03/09/14 0525 03/10/14 0400 03/11/14 0615  WBC 16.3* 19.3* 17.8*  HGB 10.0* 9.8* 10.0*  HCT 30.1* 29.6* 31.2*  PLT 412* 472* 503*   Coag's No results found for this basename: APTT, INR,  in the last 168 hours BMET  Recent Labs Lab 03/09/14 0525 03/10/14 0400 03/11/14 0615  NA 139 138 139  K 4.9 4.1 4.7  CL 100 95* 99  CO2 27 29 27  BUN 20 26* 26*  CREATININE 1.10 1.12* 0.79  GLUCOSE 176* 140* 144*   Electrolytes  Recent Labs Lab 03/06/14 0535  03/09/14 0525 03/10/14 0400 03/11/14 0615  CALCIUM 8.6  < > 9.3 9.6 9.9  MG 1.9  --   --  2.2 2.5  PHOS 3.5  --   --  4.8* 3.4  < > = values in this interval not displayed. Sepsis Markers  Recent Labs Lab 03/08/14 0600 03/09/14 0520 03/10/14 0400  PROCALCITON 0.58 0.29 0.25   ABG  Recent Labs Lab 03/04/14 1400 03/08/14 0320  03/09/14 0600  PHART 7.323* 7.444 7.426  PCO2ART 42.3 36.4 39.6  PO2ART 253.0* 70.5* 90.8   Liver Enzymes  Recent Labs Lab 03/09/14 0525 03/10/14 0400 03/11/14 0615  AST 31 38* 27  ALT 54* 55* 46*  ALKPHOS 88 86 89  BILITOT 0.8 0.7 0.7  ALBUMIN 2.7* 2.9* 3.0*   Cardiac Enzymes No results found for this basename: TROPONINI, PROBNP,  in the last 168 hours Glucose  Recent Labs Lab 03/10/14 0751 03/10/14 1124 03/10/14 1547 03/10/14 1956 03/10/14 2326 03/11/14 0436  GLUCAP 145* 147* 164* 136* 159* 155*    Imaging Dg Chest Port 1 View  03/10/2014   CLINICAL DATA:  Hypoxia  EXAM: PORTABLE CHEST - 1 VIEW  COMPARISON:  March 09, 2014  FINDINGS: Endotracheal tube tip is 2.0 cm above the carina. Nasogastric tube tip and side port are in the stomach. Central catheter tip is in the superior vena cava. No pneumothorax. There is left base consolidation. There is scarring in the right mid and lower lung zones. There is also right apical pleural thickening which is stable. Heart is mildly enlarged with pulmonary vascularity within normal limits. No adenopathy.  IMPRESSION: Tube and catheter positions as described without pneumothorax. Persistent left lower lobe consolidation. Areas of scarring on the right appear stable.   Electronically Signed   By: Bretta Bang M.D.   On: 03/10/2014 12:08   Dg Abd Portable 1v  03/10/2014   CLINICAL DATA:  Abdominal distention  EXAM: PORTABLE ABDOMEN - 1 VIEW  COMPARISON:  None.  FINDINGS: There is generalized bowel dilatation. A small amount of air is present in the rectum. No free air is seen on this supine examination. Nasogastric tube tip and side port are in the body of the stomach. There are phleboliths in the pelvis.  IMPRESSION: The bowel gas pattern is most consistent with ileus, although a degree of distal obstruction cannot be entirely excluded. No free air is seen on this supine examination. Nasogastric tube tip and side port in stomach.    Electronically Signed   By: Bretta Bang M.D.   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, Quantiferon GOLD positive, RUL post segment scar) P:   Will need tracheostomy given persistent hypoxemia and agitation Continue full vent support Continue diuresis per FACT trial Daily CXR Still needs sedation for RASS -2 given hypoxemia with vent dysynchrony  CARDIOVASCULAR CVL 9/3 l i j cvl>> Aline 9/3>> A:  Post cardiac arrest secondary resp arrest Shock resolved CHF, systolic, compensated, TTE LVEF 35% with regional wall motion abnormalities P: Diurese as able Tele Will need cardiology input prior to discharge given  regional wall motion abnormatlies  RENAL A:   AKI > resolved  P:   Monitor UOP and BMET  GASTROINTESTINAL A:   Marked abdominal distension with ileus > narcotic related? ogilvie's?   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 Negative CT 9/10> no clear evidence of anoxic injury Hx EtOH abuse, withdrawal potential  Failed precedex  P:   RASS goal  -2 Continue seroquel, clonazepam Change fentanyl to dilaudid Will need  trach   Family: I tried to call her daughter [name] [name] for an update and to tell her that her mother needs a tracheostomy.  However I had to leave a message.    Critical care time x 40 minutes   Heber [place], MD [facility] Pager: [phone] Cell: [phone] If no response, call [phone]

## 2014-03-11 NOTE — Progress Notes (Signed)
With any stimulation such as suction or cleaning, pt becomes severely agitated.  Recently requiring 3 RN assistance just to suction.  Pt has history of self-extubation. Sedation at max dose.  Elink notified.  Dr. Sherene Sires ordered sedation changes.  Will continue to assess.

## 2014-03-12 ENCOUNTER — Encounter (HOSPITAL_COMMUNITY): Payer: Self-pay

## 2014-03-12 ENCOUNTER — Inpatient Hospital Stay (HOSPITAL_COMMUNITY): Payer: Self-pay

## 2014-03-12 LAB — CULTURE, RESPIRATORY

## 2014-03-12 LAB — CULTURE, RESPIRATORY W GRAM STAIN: Special Requests: NORMAL

## 2014-03-12 LAB — URINE CULTURE: SPECIAL REQUESTS: NORMAL

## 2014-03-12 LAB — GLUCOSE, CAPILLARY
GLUCOSE-CAPILLARY: 120 mg/dL — AB (ref 70–99)
GLUCOSE-CAPILLARY: 101 mg/dL — AB (ref 70–99)
Glucose-Capillary: 101 mg/dL — ABNORMAL HIGH (ref 70–99)
Glucose-Capillary: 87 mg/dL (ref 70–99)
Glucose-Capillary: 90 mg/dL (ref 70–99)
Glucose-Capillary: 94 mg/dL (ref 70–99)

## 2014-03-12 LAB — CULTURE, BLOOD (ROUTINE X 2)

## 2014-03-12 LAB — CBC WITH DIFFERENTIAL/PLATELET
BASOS ABS: 0 10*3/uL (ref 0.0–0.1)
BASOS PCT: 0 % (ref 0–1)
Eosinophils Absolute: 0.2 10*3/uL (ref 0.0–0.7)
Eosinophils Relative: 1 % (ref 0–5)
HEMATOCRIT: 28.1 % — AB (ref 36.0–46.0)
HEMOGLOBIN: 9.4 g/dL — AB (ref 12.0–15.0)
LYMPHS ABS: 2.4 10*3/uL (ref 0.7–4.0)
Lymphocytes Relative: 14 % (ref 12–46)
MCH: 29.9 pg (ref 26.0–34.0)
MCHC: 33.5 g/dL (ref 30.0–36.0)
MCV: 89.5 fL (ref 78.0–100.0)
MONOS PCT: 7 % (ref 3–12)
Monocytes Absolute: 1.2 10*3/uL — ABNORMAL HIGH (ref 0.1–1.0)
NEUTROS ABS: 13.3 10*3/uL — AB (ref 1.7–7.7)
Neutrophils Relative %: 78 % — ABNORMAL HIGH (ref 43–77)
Platelets: 513 10*3/uL — ABNORMAL HIGH (ref 150–400)
RBC: 3.14 MIL/uL — ABNORMAL LOW (ref 3.87–5.11)
RDW: 14.1 % (ref 11.5–15.5)
WBC: 17.1 10*3/uL — ABNORMAL HIGH (ref 4.0–10.5)

## 2014-03-12 LAB — BASIC METABOLIC PANEL
Anion gap: 14 (ref 5–15)
BUN: 29 mg/dL — ABNORMAL HIGH (ref 6–23)
CHLORIDE: 94 meq/L — AB (ref 96–112)
CO2: 29 meq/L (ref 19–32)
CREATININE: 0.97 mg/dL (ref 0.50–1.10)
Calcium: 9.8 mg/dL (ref 8.4–10.5)
GFR calc Af Amer: 80 mL/min — ABNORMAL LOW (ref >90)
GFR calc non Af Amer: 69 mL/min — ABNORMAL LOW (ref >90)
GLUCOSE: 85 mg/dL (ref 70–99)
Potassium: 3.9 mEq/L (ref 3.7–5.3)
Sodium: 137 mEq/L (ref 137–147)

## 2014-03-12 LAB — CORTISOL: Cortisol, Plasma: 25.1 ug/dL

## 2014-03-12 LAB — QUANTIFERON TB GOLD ASSAY (BLOOD)
Interferon Gamma Release Assay: POSITIVE — AB
Mitogen value: 4.03 IU/mL
QUANTIFERON NIL VALUE: 0.02 [IU]/mL
TB ANTIGEN MINUS NIL VALUE: 4.55 [IU]/mL
TB Ag value: 4.57 IU/mL

## 2014-03-12 LAB — LEGIONELLA ANTIGEN, URINE

## 2014-03-12 LAB — PHOSPHORUS: Phosphorus: 4 mg/dL (ref 2.3–4.6)

## 2014-03-12 LAB — MAGNESIUM: Magnesium: 2.4 mg/dL (ref 1.5–2.5)

## 2014-03-12 MED ORDER — MIDAZOLAM HCL 2 MG/2ML IJ SOLN
5.0000 mg | Freq: Once | INTRAMUSCULAR | Status: AC
  Administered 2014-03-12: 5 mg via INTRAVENOUS

## 2014-03-12 MED ORDER — FUROSEMIDE 10 MG/ML IJ SOLN
40.0000 mg | Freq: Four times a day (QID) | INTRAMUSCULAR | Status: AC
  Administered 2014-03-12 – 2014-03-13 (×2): 40 mg via INTRAVENOUS
  Filled 2014-03-12 (×2): qty 4

## 2014-03-12 MED ORDER — CLONAZEPAM 1 MG PO TABS
2.0000 mg | ORAL_TABLET | Freq: Three times a day (TID) | ORAL | Status: DC
  Administered 2014-03-12 – 2014-03-13 (×2): 2 mg via ORAL
  Filled 2014-03-12 (×2): qty 2

## 2014-03-12 MED ORDER — FOLIC ACID 5 MG/ML IJ SOLN
1.0000 mg | Freq: Every day | INTRAMUSCULAR | Status: DC
  Administered 2014-03-12 – 2014-03-13 (×2): 1 mg via INTRAVENOUS
  Filled 2014-03-12 (×3): qty 0.2

## 2014-03-12 MED ORDER — THIAMINE HCL 100 MG/ML IJ SOLN
100.0000 mg | Freq: Every day | INTRAMUSCULAR | Status: DC
  Administered 2014-03-12 – 2014-03-13 (×2): 100 mg via INTRAVENOUS
  Filled 2014-03-12 (×3): qty 2

## 2014-03-12 MED ORDER — POTASSIUM CHLORIDE 10 MEQ/50ML IV SOLN
10.0000 meq | INTRAVENOUS | Status: AC
  Administered 2014-03-12 (×4): 10 meq via INTRAVENOUS
  Filled 2014-03-12 (×4): qty 50

## 2014-03-12 MED ORDER — MIDAZOLAM HCL 2 MG/2ML IJ SOLN
1.0000 mg | INTRAMUSCULAR | Status: DC | PRN
  Administered 2014-03-12 (×3): 2 mg via INTRAVENOUS
  Filled 2014-03-12 (×3): qty 2

## 2014-03-12 MED ORDER — MIDAZOLAM HCL 2 MG/2ML IJ SOLN
INTRAMUSCULAR | Status: AC
  Filled 2014-03-12: qty 6

## 2014-03-12 NOTE — Progress Notes (Signed)
Per family the patient drinks greater than two beers every day.

## 2014-03-12 NOTE — Progress Notes (Signed)
RT advanced ETT 2cm from 19cm to previous position of 21cm.

## 2014-03-12 NOTE — Progress Notes (Signed)
PULMONARY / CRITICAL CARE MEDICINE   Name: [name] [name] MRN: [id number] DOB: 1965/10/21    ADMISSION DATE:  03/02/2014  HISTORY OF PRESENT ILLNESS:   [name](smoker) who was seen in ED 8/31 with SOB, treated and released. She presented to [facility] ED 9/3 with B infiltrates, acute resp distress and subsequently has respiratory/cardiac arrest with one round of epi/cpr/intubation. She was acidotic but improved with ventilatory support.   STUDIES:  CT-PA 9/3 >> no large PE (exam limited), B LAD, B LL predominant infiltrates and consolidation, small B effusions TTE 9/4/ >> regional LV hypokinesis and dilation, EF 30-35%, dilated LA,  9/11 CT head negative 9/12 CT abdomen >> consistent with colonic ileus, bilateral effusions atelectasis   SIGNIFICANT EVENTS: 9/3 resp->cardiac arrest 9/3 intubated. Placed on respiratory isolation given concern about Tb w/ superior scarring in lung.   9/3 family at bedside. She is from [place] and is fighting with her husband. One english speaking daughter. Parents are POA per daughter. 9/5: increased FIO2 needs. Transitioned to ARDS protocol. Vasopressors started.  9/6 AFB negative X 3. But quantiferon gold positive. Spoke w/ ID. Given three negative AFBs resp isolation d/c'd 9/7 sedation increased to tolerate low Vt ventilation. Weaned off pressors.  9/8 attempting to transition to precedex from versed. Starting diuresis per FACT approach  9/8 pm hours self extubated, required re-intubation  9/9 escalating diuretics ala FACT approach. Agitation/dilirium remained a huge barrier. Added low dose clonazepam, low dose clonidine, low dose seroquel and allowed for escalated precedex dosing in order to help wean off versed gtt. Neo ordered to support BP while titrating these meds. Failed precedex in the afternoon and placed back on versed w/ escalated seroquel  9/10 All culture data negative. PCT NML. ABX (vanc/zosyn and azithro) stopped 9/12 changed to propofol for  continued agitation, tube feedings held and NG to suction for profound abdominal distension due to colonic ileus  SUBJECTIVE:  Still with marked agitation, changed to propofol.  VITAL SIGNS: Temp:  [98.1 F (36.7 C)-100.2 F (37.9 C)] 98.8 F (37.1 C) (09/13 0800) Pulse Rate:  [87-147] 89 (09/13 0800) Resp:  [0-29] 18 (09/13 0800) BP: (78-203)/(43-92) 97/62 mmHg (09/13 0800) SpO2:  [92 %-100 %] 99 % (09/13 0800) FiO2 (%):  [30 %] 30 % (09/13 0753) Weight:  [61 kg (134 lb 7.7 oz)] 61 kg (134 lb 7.7 oz) (09/13 0345) HEMODYNAMICS:   VENTILATOR SETTINGS: Vent Mode:  [-] PRVC FiO2 (%):  [30 %] 30 % Set Rate:  [18 bmp] 18 bmp Vt Set:  [420 mL] 420 mL PEEP:  [5 cmH20] 5 cmH20 Plateau Pressure:  [20 cmH20-29 cmH20] 20 cmH20 INTAKE / OUTPUT:  Intake/Output Summary (Last 24 hours) at 03/12/14 1009 Last data filed at 03/12/14 0800  Gross per 24 hour  Intake 599.23 ml  Output   3365 ml  Net -2765.77 ml    PHYSICAL EXAMINATION: General:  Markedly agitated on vent again today HEENT: NCAT, PERRL, ETT PULM: clear today CV: Tachy, regular, no clear murmur AB: soft, non-distended, nontender Ext: warm, no edema Neuro: Agitated again but today focuses, nods head to questions  LABS:  CBC  Recent Labs Lab 03/10/14 0400 03/11/14 0615 03/12/14 0450  WBC 19.3* 17.8* 17.1*  HGB 9.8* 10.0* 9.4*  HCT 29.6* 31.2* 28.1*  PLT 472* 503* 513*   Coag's No results found for this basename: APTT, INR,  in the last 168 hours BMET  Recent Labs Lab 03/10/14 0400 03/11/14 0615 03/12/14 0450  NA 138  139 137  K 4.1 4.7 3.9  CL 95* 99 94*  CO2 29 27 29   BUN 26* 26* 29*  CREATININE 1.12* 0.79 0.97  GLUCOSE 140* 144* 85   Electrolytes  Recent Labs Lab 03/10/14 0400 03/11/14 0615 03/12/14 0450  CALCIUM 9.6 9.9 9.8  MG 2.2 2.5 2.4  PHOS 4.8* 3.4 4.0   Sepsis Markers  Recent Labs Lab 03/08/14 0600 03/09/14 0520 03/10/14 0400  PROCALCITON 0.58 0.29 0.25   ABG  Recent  Labs Lab 03/08/14 0320 03/09/14 0600  PHART 7.444 7.426  PCO2ART 36.4 39.6  PO2ART 70.5* 90.8   Liver Enzymes  Recent Labs Lab 03/09/14 0525 03/10/14 0400 03/11/14 0615  AST 31 38* 27  ALT 54* 55* 46*  ALKPHOS 88 86 89  BILITOT 0.8 0.7 0.7  ALBUMIN 2.7* 2.9* 3.0*   Cardiac Enzymes No results found for this basename: TROPONINI, PROBNP,  in the last 168 hours Glucose  Recent Labs Lab 03/11/14 1117 03/11/14 1528 03/11/14 1959 03/11/14 2353 03/12/14 0418 03/12/14 0742  GLUCAP 129* 91 87 101* 90 87    Imaging Ct Abdomen Pelvis Wo Contrast  03/11/2014   CLINICAL DATA:  Patient on ventilator, abdominal distention  EXAM: CT ABDOMEN AND PELVIS WITHOUT CONTRAST  TECHNIQUE: Multidetector CT imaging of the abdomen and pelvis was performed following the standard protocol without IV contrast.  COMPARISON:  Abdominal radiograph 03/11/2014  FINDINGS: Oral contrast was given by NG tube. Oral contrast is seen within the stomach. Oral contrast has also progressed into small bowel and to a lesser degree through the colon with a small volume of oral contrast into the rectum. Rectum is distended and fluid-filled, with a diameter of 7.2 cm. There are air-fluid levels throughout the colon. Transverse colon is distended 2 6.7 cm. Cecum measures 7.6 cm. Small bowel is relatively decompressed. There is no evidence of free air.  Bladder is decompressed by Foley catheter. There are no abnormalities involving reproductive organs. There is trace free fluid in the pelvis. Aorta is nondilated.  Given limited evaluation without IV contrast, liver and spleen are normal. Gallbladder appears normal. Pancreas is normal. Adrenal glands and kidneys are normal. NG tube extends into the stomach with tip in the region of the antrum.  There are no acute musculoskeletal findings. There is interstitial prominence in the visualized portions of the lung bases suggesting pulmonary edema. There is cardiac enlargement. There is  a small right in a moderate left pleural effusion. Bilateral lower lobe consolidation dependently is likely related to atelectasis.  IMPRESSION: 1. Findings most consistent with colonic ileus 2. Findings consistent with pulmonary edema, pleural effusions, and bilateral dependent atelectasis.   Electronically Signed   By: Esperanza Heir M.D.   On: 03/11/2014 10:19   Dg Chest Port 1 View  03/11/2014   CLINICAL DATA:  Evaluate endotracheal tube  EXAM: PORTABLE CHEST - 1 VIEW  COMPARISON:  03/10/2014  FINDINGS: Endotracheal tube tip is identified 1.7 cm above the carina. No change left central line. Stable cardiac enlargement exaggerated by limited inspiratory effect. There is fibrotic change in the right middle lobe. There is stable mild diffuse interstitial change.  There is gaseous distention of the stomach. NG tube is unchanged, again seen passing into the stomach.  IMPRESSION: No significant interval change   Electronically Signed   By: Esperanza Heir M.D.   On: 03/11/2014 07:20   Dg Abd Portable 1v  03/11/2014   CLINICAL DATA:  Abdominal distention.  EXAM: PORTABLE ABDOMEN - 1  VIEW  COMPARISON:  Abdominal radiograph performed 03/10/2014  FINDINGS: There is diffuse dilatation of small and large bowel loops throughout the abdomen and pelvis, and of the stomach, compatible with ileus. This is similar in appearance to the prior study. The patient's enteric tube is noted ending at the antrum of the stomach. No free intra-abdominal air is seen, though evaluation for free air is limited on a single supine view.  The visualized osseous structures are within normal limits; the sacroiliac joints are unremarkable in appearance. Mild bibasilar atelectasis is noted.  IMPRESSION: Relatively stable appearance to diffuse ileus. No free intra-abdominal air seen.   Electronically Signed   By: Roanna Raider M.D.   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 Continue full vent support Continue diuresis per FACT trial Daily CXR Continue RASS goal -2  CARDIOVASCULAR CVL 9/3 l i j cvl>> Aline 9/3>> A:  Post cardiac arrest secondary resp arrest Shock resolved CHF, systolic, compensated, TTE LVEF 35% with regional wall motion abnormalities P: Diurese again 9/13 Tele Will need cardiology input prior to discharge given regional wall motion abnormalities Keep K >4.0, Mag > 2.0  RENAL A:   AKI > resolved hypokalemia P:   Monitor UOP and BMET Replete K  GASTROINTESTINAL A:   Colonic ileus > markedly improved with NG suction   P:   Continue to hold tube feedings OG to suction  HEMATOLOGIC A:   Anemia of chronic disease P:  Trend CBC every day Transfuse for Hgb < 7  INFECTIOUS A:   Severe CAP > resolving 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 P:  Culture if febrile Continue to hold ABX for now  ENDOCRINE A:   Hyperglycemia P:   SSI per protocol  NEUROLOGIC A:   Acute encephalopathy/agitation > overall improving as she is  Negative CT 9/10> no clear evidence of anoxic injury Hx EtOH abuse, suspect withdrawal Failed precedex  P:   RASS goal  -2 Add thiamine, folate Continue seroquel, increase clonazepam Continue dilaudid gtt Continue propofol gtt  Family: Updated family Janelle Floor (cousin) at bedside  Global: ARDS improving, mental status improving, colonic ileus improving; hopeful extubation 9/14   Critical care time x 35 minutes   Heber [place],  MD [place] [facility] Pager: [phone] Cell: [phone] If no response, call [phone]

## 2014-03-13 ENCOUNTER — Inpatient Hospital Stay (HOSPITAL_COMMUNITY): Payer: Self-pay

## 2014-03-13 LAB — CBC WITH DIFFERENTIAL/PLATELET
BASOS ABS: 0 10*3/uL (ref 0.0–0.1)
Basophils Relative: 0 % (ref 0–1)
Eosinophils Absolute: 0.3 10*3/uL (ref 0.0–0.7)
Eosinophils Relative: 2 % (ref 0–5)
HCT: 30.9 % — ABNORMAL LOW (ref 36.0–46.0)
Hemoglobin: 10.2 g/dL — ABNORMAL LOW (ref 12.0–15.0)
LYMPHS ABS: 2.5 10*3/uL (ref 0.7–4.0)
LYMPHS PCT: 15 % (ref 12–46)
MCH: 29.7 pg (ref 26.0–34.0)
MCHC: 33 g/dL (ref 30.0–36.0)
MCV: 89.8 fL (ref 78.0–100.0)
MONOS PCT: 6 % (ref 3–12)
Monocytes Absolute: 1 10*3/uL (ref 0.1–1.0)
Neutro Abs: 12.7 10*3/uL — ABNORMAL HIGH (ref 1.7–7.7)
Neutrophils Relative %: 77 % (ref 43–77)
PLATELETS: 588 10*3/uL — AB (ref 150–400)
RBC: 3.44 MIL/uL — ABNORMAL LOW (ref 3.87–5.11)
RDW: 14.3 % (ref 11.5–15.5)
WBC: 16.5 10*3/uL — AB (ref 4.0–10.5)

## 2014-03-13 LAB — GLUCOSE, CAPILLARY
GLUCOSE-CAPILLARY: 102 mg/dL — AB (ref 70–99)
GLUCOSE-CAPILLARY: 91 mg/dL (ref 70–99)
GLUCOSE-CAPILLARY: 107 mg/dL — AB (ref 70–99)
GLUCOSE-CAPILLARY: 91 mg/dL (ref 70–99)
Glucose-Capillary: 89 mg/dL (ref 70–99)

## 2014-03-13 LAB — BASIC METABOLIC PANEL
ANION GAP: 18 — AB (ref 5–15)
BUN: 28 mg/dL — ABNORMAL HIGH (ref 6–23)
CALCIUM: 10.2 mg/dL (ref 8.4–10.5)
CO2: 28 mEq/L (ref 19–32)
Chloride: 93 mEq/L — ABNORMAL LOW (ref 96–112)
Creatinine, Ser: 1.12 mg/dL — ABNORMAL HIGH (ref 0.50–1.10)
GFR calc Af Amer: 67 mL/min — ABNORMAL LOW (ref >90)
GFR, EST NON AFRICAN AMERICAN: 58 mL/min — AB (ref >90)
Glucose, Bld: 87 mg/dL (ref 70–99)
Potassium: 3.7 mEq/L (ref 3.7–5.3)
Sodium: 139 mEq/L (ref 137–147)

## 2014-03-13 LAB — PHOSPHORUS: PHOSPHORUS: 5.3 mg/dL — AB (ref 2.3–4.6)

## 2014-03-13 LAB — MAGNESIUM: Magnesium: 2.6 mg/dL — ABNORMAL HIGH (ref 1.5–2.5)

## 2014-03-13 MED ORDER — HYDROMORPHONE HCL PF 1 MG/ML IJ SOLN
0.5000 mg | INTRAMUSCULAR | Status: DC | PRN
  Administered 2014-03-13 (×2): 1 mg via INTRAVENOUS
  Administered 2014-03-13: 0.5 mg via INTRAVENOUS
  Administered 2014-03-15: 1 mg via INTRAVENOUS
  Administered 2014-03-16: 0.5 mg via INTRAVENOUS
  Administered 2014-03-16: 1 mg via INTRAVENOUS
  Filled 2014-03-13 (×7): qty 1

## 2014-03-13 MED ORDER — POTASSIUM CHLORIDE 10 MEQ/50ML IV SOLN
10.0000 meq | INTRAVENOUS | Status: AC
  Administered 2014-03-13 (×4): 10 meq via INTRAVENOUS
  Filled 2014-03-13 (×4): qty 50

## 2014-03-13 MED ORDER — HYDROMORPHONE HCL PF 1 MG/ML IJ SOLN: 0.5000 mg | INTRAMUSCULAR | Status: DC | PRN

## 2014-03-13 MED ORDER — PANTOPRAZOLE SODIUM 40 MG IV SOLR
40.0000 mg | Freq: Every day | INTRAVENOUS | Status: DC
  Administered 2014-03-13 – 2014-03-17 (×5): 40 mg via INTRAVENOUS
  Filled 2014-03-13 (×5): qty 40

## 2014-03-13 NOTE — Care Management Note (Signed)
    Page 1 of 2   03/13/2014     12:17:50 PM CARE MANAGEMENT NOTE 03/13/2014  Patient:  [name] [name],[name] [name]   Account Number:  [id number]  Date Initiated:  03/03/2014  Documentation initiated by:  DAVIS,RHONDA  Subjective/Objective Assessment:   [name](smoker) who was seen in ED 8/31 with SOB, treated and released. She presented to [facility] ED 9/3 with acute resp distress and subsequently has respiratory/cardiac arrest     Action/Plan:   tbd based on her progress and if she awakes   Anticipated DC Date:  03/16/2014   Anticipated DC Plan:  SKILLED NURSING FACILITY  In-house referral  Clinical Chief Strategy Officer      DC Planning Services  CM consult      [facility] Choice  NA   Choice offered to / List presented to:  NA   DME arranged  NA      DME agency  NA     HH arranged  NA      HH agency  NA   Status of service:  In process, will continue to follow Medicare Important Message given?  NO (If response is "NO", the following Medicare IM given date fields will be blank) Date Medicare IM given:   Medicare IM given by:   Date Additional Medicare IM given:   Additional Medicare IM given by:    Discharge Disposition:    Per UR Regulation:  Reviewed for med. necessity/level of care/duration of stay  If discussed at Long Length of Stay Meetings, dates discussed:    Comments:  03/13/14 [name] Wheeling RN,BSN NCM [phone] EXTUBATED.PNA,CHF,COLONIC ILEUS-NG,DIPRIVAN CONTINUOUS,INSULIN,NEBS.RECOMMEND PT CONS WHEN MEDICALLY APPROPRIATE.  [name] Smiling, RN,BSN,CCM [phone]: update: patient remains in critical care on the vent/sedated/invasive hemodynamic monitoring ongoing. [id number]/patient remiains on iv versed due to extreme agiations, continues to be vent dependant/[place].RL[place]

## 2014-03-13 NOTE — Progress Notes (Signed)
Wasted Dilaudid IV drip that was used during intubation- 17cc wasted in sink with Lavell Anchors.

## 2014-03-13 NOTE — Procedures (Signed)
Extubation Procedure Note  Patient Details:   Name: Territa Mori DOB: 08/09/1965 MRN: [id number]   Airway Documentation:     Evaluation  O2 sats: stable throughout Complications: No apparent complications Patient did tolerate procedure well. Bilateral Breath Sounds: Clear Suctioning: Oral;Airway Yes  Pt tolerated well. No sputum post extubation. Diminished BS throughout with faint rhonchi in lower bases. Pt placed on 2L .  RN aware.   Carolan Shiver 03/13/2014, 10:37 AM

## 2014-03-13 NOTE — Progress Notes (Signed)
NUTRITION FOLLOW UP  INTERVENTION: - Diet advancement per MD - RD to continue to monitor   NUTRITION DIAGNOSIS: Inadequate oral intake related to inability to eat as evidenced by npo status - ongoing   Goal: Patient to meet >90% estimated needs with TF vs oral nutrition- not met  New goal: Advance diet as tolerated to regular diet  Monitor:  Weights, labs, diet advancement    ASSESSMENT: Acute respiratory failure, multilobar LL predominant CAP, ARDS.  Post cardiac arrest secondary to respiratory arrest, shock.  9/4:  Family not available.  Spoke with nurse.  Patient requires maximum sedation.  Intubated.  9/7: -Round with MD and nursing -No BM since admit -Order to begin TF -Propofol d/c'ed  9/8: -Remains intubated  -Per RN, pt without any TF residuals today  -Remains on ARDS protocol  -Weight up 13 pounds since admission, with generalized edema -TF via OGT: Oxepa at 45 ml/hr with Prostat 37m BID which provides 1820 kcal, 98 gm protein, 853 ml water and meet 103% estimated kcal and 103% estimated protein needs.   9/14: -Extubated this morning, TF d/c -Had abdominal distention 9/12 due to colonic ileus per MD notes -MD noted today ileus markedly improved with NG suction -Per family member at bedside, pt unable to talk -Family reports pt eating 3 meals/day with stable weight PTA   Height: Ht Readings from Last 1 Encounters:  03/02/14 5' 6"  (1.676 m)    Weight: Wt Readings from Last 1 Encounters:  03/13/14 131 lb 13.4 oz (59.8 kg)  Admit wt         130 lb (58.9 kg)   Estimated Nutritional Needs: Kcal: 1600-1800 Protein: 85-95 gm  Fluid: 1.6-1.8 L daily  Skin: intact  Diet Order: NPO     Intake/Output Summary (Last 24 hours) at 03/13/14 1318 Last data filed at 03/13/14 1200  Gross per 24 hour  Intake 850.82 ml  Output   3565 ml  Net -2714.18 ml    Last BM: 9/13  Labs:   Recent Labs Lab 03/11/14 0615 03/12/14 0450 03/13/14 0600   NA 139 137 139  K 4.7 3.9 3.7  CL 99 94* 93*  CO2 27 29 28   BUN 26* 29* 28*  CREATININE 0.79 0.97 1.12*  CALCIUM 9.9 9.8 10.2  MG 2.5 2.4 2.6*  PHOS 3.4 4.0 5.3*  GLUCOSE 144* 85 87    CBG (last 3)   Recent Labs  03/13/14 0441 03/13/14 0804 03/13/14 1217  GLUCAP 102* 91 107*    Scheduled Meds: . antiseptic oral rinse  7 mL Mouth Rinse QID  . chlorhexidine  15 mL Mouth Rinse BID  . clonazePAM  2 mg Oral 3 times per day  . heparin  5,000 Units Subcutaneous 3 times per day  . [START ON 03/14/2014] Influenza vac split quadrivalent PF  0.5 mL Intramuscular Once  . insulin aspart  0-15 Units Subcutaneous 6 times per day  . ipratropium-albuterol  3 mL Nebulization Q4H  . pantoprazole (PROTONIX) IV  40 mg Intravenous Daily  . potassium chloride  10 mEq Intravenous Q1 Hr x 4  . QUEtiapine  100 mg Per Tube BID    Continuous Infusions: . sodium chloride 10 mL/hr at 03/13/14 1200  . phenylephrine (NEO-SYNEPHRINE) Adult infusion Stopped (03/11/14 0300)    Past Medical History  Diagnosis Date  . Back pain   . Gastritis     Past Surgical History  Procedure Laterality Date  . Cesarean section      HNira Conn  Marybelle Killings MS, [place], [place] Pager [phone] Weekend/After Hours Pager

## 2014-03-13 NOTE — Progress Notes (Signed)
PULMONARY / CRITICAL CARE MEDICINE   Name: [name] [name] MRN: [id number] DOB: [birth year]    ADMISSION DATE:  03/02/2014  HISTORY OF PRESENT ILLNESS:   [name](smoker) who was seen in ED 8/31 with SOB, treated and released. She presented to [facility] ED 9/3 with B infiltrates, acute resp distress and subsequently has respiratory/cardiac arrest with one round of epi/cpr/intubation. She was acidotic but improved with ventilatory support.   STUDIES:  CT-PA 9/3 >> no large PE (exam limited), B LAD, B LL predominant infiltrates and consolidation, small B effusions TTE 9/4/ >> regional LV hypokinesis and dilation, EF 30-35%, dilated LA,  9/11 CT head negative 9/12 CT abdomen >> consistent with colonic ileus, bilateral effusions atelectasis   SIGNIFICANT EVENTS: 9/3 resp->cardiac arrest 9/3 intubated. Placed on respiratory isolation given concern about Tb w/ superior scarring in lung.   9/3 family at bedside. She is from [place] and is fighting with her husband. One english speaking daughter. Parents are POA per daughter. 9/5: increased FIO2 needs. Transitioned to ARDS protocol. Vasopressors started.  9/6 AFB negative X 3. But quantiferon gold positive. Spoke w/ ID. Given three negative AFBs resp isolation d/c'd 9/7 sedation increased to tolerate low Vt ventilation. Weaned off pressors.  9/8 attempting to transition to precedex from versed. Starting diuresis per FACT approach  9/8 pm hours self extubated, required re-intubation  9/9 escalating diuretics ala FACT approach. Agitation/dilirium remained a huge barrier. Added low dose clonazepam, low dose clonidine, low dose seroquel and allowed for escalated precedex dosing in order to help wean off versed gtt. Neo ordered to support BP while titrating these meds. Failed precedex in the afternoon and placed back on versed w/ escalated seroquel  9/10 All culture data negative. PCT NML. ABX (vanc/zosyn and azithro) stopped 9/12 changed to propofol for  continued agitation, tube feedings held and NG to suction for profound abdominal distension due to colonic ileus 9/14 attempt extubation.    SUBJECTIVE:  Less agitated  VITAL SIGNS: Temp:  [97.5 F (36.4 C)-100.2 F (37.9 C)] 97.5 F (36.4 C) (09/14 0815) Pulse Rate:  [84-117] 84 (09/14 0700) Resp:  [18-26] 19 (09/14 0815) BP: (76-119)/(49-77) 82/56 mmHg (09/14 0815) SpO2:  [98 %-100 %] 100 % (09/14 0700) FiO2 (%):  [30 %] 30 % (09/14 0700) Weight:  [131 lb 13.4 oz (59.8 kg)] 131 lb 13.4 oz (59.8 kg) (09/14 0207) HEMODYNAMICS:   VENTILATOR SETTINGS: Vent Mode:  [-] PRVC FiO2 (%):  [30 %] 30 % Set Rate:  [18 bmp] 18 bmp Vt Set:  [420 mL] 420 mL PEEP:  [5 cmH20] 5 cmH20 Plateau Pressure:  [20 cmH20-25 cmH20] 21 cmH20 INTAKE / OUTPUT:  Intake/Output Summary (Last 24 hours) at 03/13/14 1478 Last data filed at 03/13/14 0800  Gross per 24 hour  Intake 951.72 ml  Output   3550 ml  Net -2598.28 ml    PHYSICAL EXAMINATION: General:  Awake and less agitated but on dilaudid and diprivan drips. HEENT: NCAT, PERRL, ETT PULM: CTA CV: Tachy, regular, no clear murmur AB: soft, non-distended, non tender, ogt to LWIS with green bile drainage Ext: warm, no edema Neuro:Less agitated, follows some commands.  LABS:  CBC  Recent Labs Lab 03/11/14 0615 03/12/14 0450 03/13/14 0600  WBC 17.8* 17.1* 16.5*  HGB 10.0* 9.4* 10.2*  HCT 31.2* 28.1* 30.9*  PLT 503* 513* 588*   Coag's No results found for this basename: APTT, INR,  in the last 168 hours BMET  Recent Labs Lab 03/11/14 0615 03/12/14  0450 03/13/14 0600  NA 139 137 139  K 4.7 3.9 3.7  CL 99 94* 93*  CO2 27 29 28   BUN 26* 29* 28*  CREATININE 0.79 0.97 1.12*  GLUCOSE 144* 85 87   Electrolytes  Recent Labs Lab 03/11/14 0615 03/12/14 0450 03/13/14 0600  CALCIUM 9.9 9.8 10.2  MG 2.5 2.4 2.6*  PHOS 3.4 4.0 5.3*   Sepsis Markers  Recent Labs Lab 03/08/14 0600 03/09/14 0520 03/10/14 0400  PROCALCITON  0.58 0.29 0.25   ABG  Recent Labs Lab 03/08/14 0320 03/09/14 0600  PHART 7.444 7.426  PCO2ART 36.4 39.6  PO2ART 70.5* 90.8   Liver Enzymes  Recent Labs Lab 03/09/14 0525 03/10/14 0400 03/11/14 0615  AST 31 38* 27  ALT 54* 55* 46*  ALKPHOS 88 86 89  BILITOT 0.8 0.7 0.7  ALBUMIN 2.7* 2.9* 3.0*   Cardiac Enzymes No results found for this basename: TROPONINI, PROBNP,  in the last 168 hours Glucose  Recent Labs Lab 03/12/14 0742 03/12/14 1230 03/12/14 1627 03/12/14 2022 03/12/14 2342 03/13/14 0804  GLUCAP 87 120* 101* 94 89 91    Imaging Dg Chest Port 1 View  03/12/2014   CLINICAL DATA:  Evaluate endotracheal tube placement.  EXAM: PORTABLE CHEST - 1 VIEW  COMPARISON:  Chest x-ray 03/11/2014.  FINDINGS: An endotracheal tube is in place with tip 3.9 cm above the carina. There is a left-sided internal jugular central venous catheter with tip terminating in the mid superior vena cava. A nasogastric tube is seen extending into the stomach, however, the tip of the nasogastric tube extends below the lower margin of the image. Lung volumes remain slightly low, but have improved compared to the recent prior examination. Again noted is a background of chronic increased interstitial markings throughout the lungs bilaterally with hyperlucency and architectural distortion in the right upper lobe, similar to multiple prior examinations. No definite acute consolidative airspace disease. No pleural effusions. No evidence of pulmonary edema. Mild cardiomegaly is unchanged. The patient is rotated to the right on today's exam, resulting in distortion of the mediastinal contours and reduced diagnostic sensitivity and specificity for mediastinal pathology.  IMPRESSION: 1. Support apparatus, as above. 2. Chronic changes in the lungs redemonstrated, suggestive of extensive areas of scarring related to prior episodes of infection or inflammation (e.g., ARDS).   Electronically Signed   By: Trudie Reed M.D.   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 Continue full vent support if fails extubation Continue diuresis per FACT trial Daily CXR Continue RASS goal 0 for extubation  CARDIOVASCULAR CVL 9/3 l i j cvl>> Aline 9/3>>out A:  Post cardiac arrest secondary resp arrest Shock resolved CHF, systolic, compensated, TTE LVEF 35% with regional wall motion abnormalities P: Diuresed 9/13 with  2500 cc ng i/o. 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 P:  Culture if febrile Continue to hold ABX for now  ENDOCRINE A:   Hyperglycemia P:   SSI per protocol  NEUROLOGIC A:   Acute  encephalopathy/agitation > overall improving as she is  Negative CT 9/10> no clear evidence of anoxic injury Hx EtOH abuse, suspect withdrawal Failed precedex  P:   RASS goal  0 Hold seroquel,  Clonazepam  gut not currently available therefore PRN versed DC dilaudid gtt 9/14 and use PRN analgesics Continue propofol gtt, try off for extubation this am  Family: Updated family Janelle Floor (cousin) at bedside(serves as interpreter due to language barrier)  Global: More awake,negative I/O. Hold sedation and attempt extubation.   Brett Canales Minor ACNP [facility] Pager [phone] till 3 pm If no answer page [phone] 03/13/2014, 9:10 AM    CC time 35 minutes  Levy Pupa, MD, PhD 03/13/2014, 10:10 AM [facility] [phone] or if no answer [phone]

## 2014-03-14 ENCOUNTER — Inpatient Hospital Stay (HOSPITAL_COMMUNITY): Payer: Self-pay

## 2014-03-14 DIAGNOSIS — I2589 Other forms of chronic ischemic heart disease: Secondary | ICD-10-CM

## 2014-03-14 LAB — GLUCOSE, CAPILLARY
GLUCOSE-CAPILLARY: 94 mg/dL (ref 70–99)
Glucose-Capillary: 96 mg/dL (ref 70–99)
Glucose-Capillary: 98 mg/dL (ref 70–99)
Glucose-Capillary: 92 mg/dL (ref 70–99)
Glucose-Capillary: 142 mg/dL — ABNORMAL HIGH (ref 70–99)
Glucose-Capillary: 128 mg/dL — ABNORMAL HIGH (ref 70–99)
Glucose-Capillary: 120 mg/dL — ABNORMAL HIGH (ref 70–99)

## 2014-03-14 LAB — CBC
HCT: 30.9 % — ABNORMAL LOW (ref 36.0–46.0)
Hemoglobin: 10.2 g/dL — ABNORMAL LOW (ref 12.0–15.0)
MCH: 29.6 pg (ref 26.0–34.0)
MCHC: 33 g/dL (ref 30.0–36.0)
MCV: 89.6 fL (ref 78.0–100.0)
PLATELETS: 648 10*3/uL — AB (ref 150–400)
RBC: 3.45 MIL/uL — AB (ref 3.87–5.11)
RDW: 13.8 % (ref 11.5–15.5)
WBC: 16.5 10*3/uL — ABNORMAL HIGH (ref 4.0–10.5)

## 2014-03-14 LAB — BASIC METABOLIC PANEL
ANION GAP: 18 — AB (ref 5–15)
BUN: 28 mg/dL — ABNORMAL HIGH (ref 6–23)
CHLORIDE: 95 meq/L — AB (ref 96–112)
CO2: 24 meq/L (ref 19–32)
Calcium: 10.1 mg/dL (ref 8.4–10.5)
Creatinine, Ser: 0.94 mg/dL (ref 0.50–1.10)
GFR calc Af Amer: 83 mL/min — ABNORMAL LOW (ref >90)
GFR calc non Af Amer: 72 mL/min — ABNORMAL LOW (ref >90)
Glucose, Bld: 92 mg/dL (ref 70–99)
POTASSIUM: 4.3 meq/L (ref 3.7–5.3)
SODIUM: 137 meq/L (ref 137–147)

## 2014-03-14 LAB — MAGNESIUM: MAGNESIUM: 2.4 mg/dL (ref 1.5–2.5)

## 2014-03-14 LAB — PHOSPHORUS: PHOSPHORUS: 4.6 mg/dL (ref 2.3–4.6)

## 2014-03-14 MED ORDER — ASPIRIN EC 81 MG PO TBEC
81.0000 mg | DELAYED_RELEASE_TABLET | Freq: Every day | ORAL | Status: DC
  Administered 2014-03-15 – 2014-03-19 (×5): 81 mg via ORAL
  Filled 2014-03-14 (×5): qty 1

## 2014-03-14 MED ORDER — FUROSEMIDE 10 MG/ML IJ SOLN
40.0000 mg | Freq: Once | INTRAMUSCULAR | Status: AC
  Administered 2014-03-14: 40 mg via INTRAVENOUS
  Filled 2014-03-14: qty 4

## 2014-03-14 MED ORDER — CARVEDILOL 3.125 MG PO TABS
3.1250 mg | ORAL_TABLET | Freq: Two times a day (BID) | ORAL | Status: DC
Start: 2014-03-15 — End: 2014-03-17
  Administered 2014-03-15 – 2014-03-17 (×5): 3.125 mg via ORAL
  Filled 2014-03-14 (×5): qty 1

## 2014-03-14 MED ORDER — INSULIN ASPART 100 UNIT/ML ~~LOC~~ SOLN
0.0000 [IU] | Freq: Three times a day (TID) | SUBCUTANEOUS | Status: DC
  Administered 2014-03-14 – 2014-03-18 (×5): 2 [IU] via SUBCUTANEOUS

## 2014-03-14 MED ORDER — POTASSIUM CHLORIDE 10 MEQ/100ML IV SOLN
10.0000 meq | INTRAVENOUS | Status: AC
  Administered 2014-03-14 (×2): 10 meq via INTRAVENOUS
  Filled 2014-03-14 (×2): qty 100

## 2014-03-14 NOTE — Consult Note (Signed)
Reason for Consult: Decreased EF Referring Physician:   [name] is an 48 y.o. female.  HPI:  Patient is a 48 year old female with history of back obese, back pain and gastritis.  Patient was seen in the emergency room on August 31 nasal congestion and shortness of breath.  At that time showed a CT of the chest which showed bilateral pleural parenchymal scarring with associated traction bronchiectasis in the right lung apex.  Mild mediastinal lymphadenopathy in the right paratracheal and subcarinal regions.  Followup CT was recommended.  Patient returned on September 3 acute respiratory distress and then respiratory and cardiac arrest with runaround epi, CPR intubation.  2-D echocardiogram September 4 revealed ejection fraction of 30-35%. Inferior , posteriorlateral and septal, hypokinesis. Moderate MR. Atrium is moderately dilated. The left pleural effusion.  She was extubated yesterday.  She was diuresed and his net -8.8 L.      Past Medical History  Diagnosis Date  . Back pain   . Gastritis     Past Surgical History  Procedure Laterality Date  . Cesarean section      History reviewed. No pertinent family history.  Social History:  reports that she has been smoking.  She has never used smokeless tobacco. She reports that she drinks alcohol. She reports that she does not use illicit drugs.  Allergies: No Known Allergies  Medications:  Scheduled Meds: . antiseptic oral rinse  7 mL Mouth Rinse QID  . chlorhexidine  15 mL Mouth Rinse BID  . heparin  5,000 Units Subcutaneous 3 times per day  . insulin aspart  0-15 Units Subcutaneous TID WC  . ipratropium-albuterol  3 mL Nebulization Q4H  . pantoprazole (PROTONIX) IV  40 mg Intravenous Daily   Continuous Infusions: . sodium chloride 10 mL/hr at 03/13/14 1900   PRN Meds:.acetaminophen (TYLENOL) oral liquid 160 mg/5 mL, HYDROmorphone (DILAUDID) injection   Results for orders placed during the hospital encounter of  03/02/14 (from the past 48 hour(s))  GLUCOSE, CAPILLARY     Status: None   Collection Time    03/12/14  8:22 PM      Result Value Ref Range   Glucose-Capillary 94  70 - 99 mg/dL  GLUCOSE, CAPILLARY     Status: None   Collection Time    03/12/14 11:42 PM      Result Value Ref Range   Glucose-Capillary 89  70 - 99 mg/dL  GLUCOSE, CAPILLARY     Status: Abnormal   Collection Time    03/13/14  4:41 AM      Result Value Ref Range   Glucose-Capillary 102 (*) 70 - 99 mg/dL  MAGNESIUM     Status: Abnormal   Collection Time    03/13/14  6:00 AM      Result Value Ref Range   Magnesium 2.6 (*) 1.5 - 2.5 mg/dL  PHOSPHORUS     Status: Abnormal   Collection Time    03/13/14  6:00 AM      Result Value Ref Range   Phosphorus 5.3 (*) 2.3 - 4.6 mg/dL  BASIC METABOLIC PANEL     Status: Abnormal   Collection Time    03/13/14  6:00 AM      Result Value Ref Range   Sodium 139  137 - 147 mEq/L   Potassium 3.7  3.7 - 5.3 mEq/L   Chloride 93 (*) 96 - 112 mEq/L   CO2 28  19 - 32 mEq/L   Glucose, Bld 87  70 - 99 mg/dL   BUN 28 (*) 6 - 23 mg/dL   Creatinine, Ser 1.12 (*) 0.50 - 1.10 mg/dL   Calcium 10.2  8.4 - 10.5 mg/dL   GFR calc non Af Amer 58 (*) >90 mL/min   GFR calc Af Amer 67 (*) >90 mL/min   Comment: (NOTE)     The eGFR has been calculated using the CKD EPI equation.     This calculation has not been validated in all clinical situations.     eGFR's persistently <90 mL/min signify possible Chronic Kidney     Disease.   Anion gap 18 (*) 5 - 15  CBC WITH DIFFERENTIAL     Status: Abnormal   Collection Time    03/13/14  6:00 AM      Result Value Ref Range   WBC 16.5 (*) 4.0 - 10.5 K/uL   RBC 3.44 (*) 3.87 - 5.11 MIL/uL   Hemoglobin 10.2 (*) 12.0 - 15.0 g/dL   HCT 30.9 (*) 36.0 - 46.0 %   MCV 89.8  78.0 - 100.0 fL   MCH 29.7  26.0 - 34.0 pg   MCHC 33.0  30.0 - 36.0 g/dL   RDW 14.3  11.5 - 15.5 %   Platelets 588 (*) 150 - 400 K/uL   Neutrophils Relative % 77  43 - 77 %   Lymphocytes  Relative 15  12 - 46 %   Monocytes Relative 6  3 - 12 %   Eosinophils Relative 2  0 - 5 %   Basophils Relative 0  0 - 1 %   Neutro Abs 12.7 (*) 1.7 - 7.7 K/uL   Lymphs Abs 2.5  0.7 - 4.0 K/uL   Monocytes Absolute 1.0  0.1 - 1.0 K/uL   Eosinophils Absolute 0.3  0.0 - 0.7 K/uL   Basophils Absolute 0.0  0.0 - 0.1 K/uL   RBC Morphology POLYCHROMASIA PRESENT     Comment: STOMATOCYTES   WBC Morphology VACUOLATED NEUTROPHILS     Comment: ATYPICAL LYMPHOCYTES   Smear Review LARGE PLATELETS PRESENT    GLUCOSE, CAPILLARY     Status: None   Collection Time    03/13/14  8:04 AM      Result Value Ref Range   Glucose-Capillary 91  70 - 99 mg/dL   Comment 1 Documented in Chart     Comment 2 Notify RN    GLUCOSE, CAPILLARY     Status: Abnormal   Collection Time    03/13/14 12:17 PM      Result Value Ref Range   Glucose-Capillary 107 (*) 70 - 99 mg/dL  GLUCOSE, CAPILLARY     Status: None   Collection Time    03/13/14  4:09 PM      Result Value Ref Range   Glucose-Capillary 91  70 - 99 mg/dL   Comment 1 Documented in Chart     Comment 2 Notify RN    GLUCOSE, CAPILLARY     Status: None   Collection Time    03/13/14  8:45 PM      Result Value Ref Range   Glucose-Capillary 96  70 - 99 mg/dL  GLUCOSE, CAPILLARY     Status: None   Collection Time    03/13/14 11:50 PM      Result Value Ref Range   Glucose-Capillary 94  70 - 99 mg/dL  GLUCOSE, CAPILLARY     Status: None   Collection Time    03/14/14  4:21 AM  Result Value Ref Range   Glucose-Capillary 98  70 - 99 mg/dL  CBC     Status: Abnormal   Collection Time    03/14/14  5:30 AM      Result Value Ref Range   WBC 16.5 (*) 4.0 - 10.5 K/uL   RBC 3.45 (*) 3.87 - 5.11 MIL/uL   Hemoglobin 10.2 (*) 12.0 - 15.0 g/dL   HCT 30.9 (*) 36.0 - 46.0 %   MCV 89.6  78.0 - 100.0 fL   MCH 29.6  26.0 - 34.0 pg   MCHC 33.0  30.0 - 36.0 g/dL   RDW 13.8  11.5 - 15.5 %   Platelets 648 (*) 150 - 400 K/uL  BASIC METABOLIC PANEL     Status: Abnormal    Collection Time    03/14/14  5:30 AM      Result Value Ref Range   Sodium 137  137 - 147 mEq/L   Potassium 4.3  3.7 - 5.3 mEq/L   Chloride 95 (*) 96 - 112 mEq/L   CO2 24  19 - 32 mEq/L   Glucose, Bld 92  70 - 99 mg/dL   BUN 28 (*) 6 - 23 mg/dL   Creatinine, Ser 0.94  0.50 - 1.10 mg/dL   Calcium 10.1  8.4 - 10.5 mg/dL   GFR calc non Af Amer 72 (*) >90 mL/min   GFR calc Af Amer 83 (*) >90 mL/min   Comment: (NOTE)     The eGFR has been calculated using the CKD EPI equation.     This calculation has not been validated in all clinical situations.     eGFR's persistently <90 mL/min signify possible Chronic Kidney     Disease.   Anion gap 18 (*) 5 - 15  PHOSPHORUS     Status: None   Collection Time    03/14/14  5:30 AM      Result Value Ref Range   Phosphorus 4.6  2.3 - 4.6 mg/dL  MAGNESIUM     Status: None   Collection Time    03/14/14  5:30 AM      Result Value Ref Range   Magnesium 2.4  1.5 - 2.5 mg/dL  GLUCOSE, CAPILLARY     Status: None   Collection Time    03/14/14  7:48 AM      Result Value Ref Range   Glucose-Capillary 92  70 - 99 mg/dL  GLUCOSE, CAPILLARY     Status: Abnormal   Collection Time    03/14/14  3:51 PM      Result Value Ref Range   Glucose-Capillary 142 (*) 70 - 99 mg/dL    Dg Chest Port 1 View  03/14/2014   CLINICAL DATA:  Atelectasis  EXAM: PORTABLE CHEST - 1 VIEW  COMPARISON:  March 13, 2014  FINDINGS: Endotracheal and nasogastric tube have been removed. No pneumothorax. Central catheter tip is in the superior vena cava. No pneumothorax. There is asymmetric apical pleural thickening on the right with scattered scarring in the right lung, stable. There is atelectatic change in the left base. There is no appreciable consolidation.  Heart is mildly enlarged with pulmonary vascularity within normal limits. No adenopathy.  IMPRESSION: Scarring on the right. Left base atelectatic change. Mild cardiac enlargement. No pneumothorax.   Electronically Signed    By: Lowella Grip M.D.   On: 03/14/2014 07:08   Dg Chest Port 1 View  03/13/2014   CLINICAL DATA:  Assess endotracheal tube position.  EXAM: PORTABLE CHEST - 1 VIEW  COMPARISON:  Portable chest x-ray of March 12, 2014  FINDINGS: The lungs are reasonably well inflated. The retrocardiac density on the left is slightly increased. The left hemidiaphragm remains partially obscured. The cardiac silhouette is normal. The pulmonary vascularity is not engorged.  The endotracheal tube tip lies 3.1 cm above the crotch of the carina. The left internal jugular venous catheter tip lies in the proximal SVC. The esophagogastric tube tip projects below the inferior margin of the image.  IMPRESSION: There has not been significant interval change in the appearance of the chest though mild worsening of left lower lobe atelectasis is not excluded. The support tubes and lines are in appropriate position.   Electronically Signed   By: David  Martinique   On: 03/13/2014 07:13    ROS Blood pressure 145/83, pulse 113, temperature 98.8 F (37.1 C), temperature source Oral, resp. rate 47, height 5' 6"  (1.676 m), weight 123 lb 3.8 oz (55.9 kg), SpO2 100.00%. Physical Exam  Assessment/Plan:  Active Problems:   Respiratory failure   CAP (community acquired pneumonia)   ARDS (adult respiratory distress syndrome)   Encephalopathy acute   Acute respiratory failure with hypoxia   Cardiomyopathy, EF 35%    Plan:  New cardiomyopathy with Inferior, posteriorlateral and septal, hypokinesis.  Possible ischemic etiology.  Troponin POC negative x 2 8/31 and 9/3. BNP was 1799 on 9/3.  She was aggressively diuresis -8.8 L.  He presented that 144 pounds was as high as 152 and is now 123.   She will need LHC.  SCr is stable.  MD to follow and finish.  Tarri Fuller, PA-C 03/14/2014, 4:31 PM    I have seen and examined the patient along with Tarri Fuller, PA.  I have reviewed the chart, notes and new data.  I agree with PA's  note.  Initial presentation was confusing, but the presence of moderately depressed LV systolic function with regional variations strongly suggests she has ischemic cardiomyopathy and presented with acute heart failure.   No Q waves are seen on ECG and enzymes were normal. She has very limited risk factors (smoking). Her ECG shows some mild dynamic lateral ST-T changes. Chest CT 8/31 did not show coronary calcifications.  Differential diagnosis is CAD with ischemic cardiomyopathy versus stress cardiomyopathy with a very atypical wall motion abnormality distribution.  PLAN: The only clear way to establish/exclude a diagnosis of CAD is coronary angiography, but I think she needs another day or so to stabilize. Tentatively plan for cath Thursday. Start ASA. Very low dose beta blockers. ACEi after cath.  Sanda Klein, MD, [place] [phone] 03/14/2014, 5:51 PM

## 2014-03-14 NOTE — Progress Notes (Signed)
03/14/14 1405  Vitals  BP ! 145/83 mmHg  MAP (mmHg) 100  BP Location Right arm  Pulse Rate ! 113  ECG Heart Rate ! 114  Resp ! 47  Oxygen Therapy  SpO2 100 %  O2 Device None (Room air)  Vitals Post Fall  Patient fell to her left side out of bed with family at bedside. Per family patient needed to urinate. Bed alarm had been set, patient wearing yellow socks, RN was at the door, patient in view of nurses station. Brett Canales Minor, NP notified at 1410. Patient cleaned, back in bed, resting at this time. Continuing to monitor.

## 2014-03-14 NOTE — Progress Notes (Signed)
PULMONARY / CRITICAL CARE MEDICINE   Name: [name] [name] MRN: [id number] DOB: [birth year]    ADMISSION DATE:  03/02/2014  HISTORY OF PRESENT ILLNESS:   [name](smoker) who was seen in ED 8/31 with SOB, treated and released. She presented to [facility] ED 9/3 with B infiltrates, acute resp distress and subsequently has respiratory/cardiac arrest with one round of epi/cpr/intubation. She was acidotic but improved with ventilatory support.   STUDIES:  CT-PA 9/3 >> no large PE (exam limited), B LAD, B LL predominant infiltrates and consolidation, small B effusions TTE 9/4/ >> regional LV hypokinesis and dilation, EF 30-35%, dilated LA,  9/11 CT head negative 9/12 CT abdomen >> consistent with colonic ileus, bilateral effusions atelectasis   SIGNIFICANT EVENTS: 9/3 resp->cardiac arrest 9/3 intubated. Placed on respiratory isolation given concern about Tb w/ superior scarring in lung.   9/3 family at bedside. She is from [place] and is fighting with her husband. One english speaking daughter. Parents are POA per daughter. 9/5: increased FIO2 needs. Transitioned to ARDS protocol. Vasopressors started.  9/6 AFB negative X 3. But quantiferon gold positive. Spoke w/ ID. Given three negative AFBs resp isolation d/c'd 9/7 sedation increased to tolerate low Vt ventilation. Weaned off pressors.  9/8 attempting to transition to precedex from versed. Starting diuresis per FACT approach  9/8 pm hours self extubated, required re-intubation  9/9 escalating diuretics ala FACT approach. Agitation/dilirium remained a huge barrier. Added low dose clonazepam, low dose clonidine, low dose seroquel and allowed for escalated precedex dosing in order to help wean off versed gtt. Neo ordered to support BP while titrating these meds. Failed precedex in the afternoon and placed back on versed w/ escalated seroquel  9/10 All culture data negative. PCT NML. ABX (vanc/zosyn and azithro) stopped 9/12 changed to propofol for  continued agitation, tube feedings held and NG to suction for profound abdominal distension due to colonic ileus 9/14  extubation. 9/15 remains extubated without distress.   SUBJECTIVE:  Extubated, awake , NAD, c/o some throat soreness and cough   VITAL SIGNS: Temp:  [98.4 F (36.9 C)-100 F (37.8 C)] 98.7 F (37.1 C) (09/15 0423) Pulse Rate:  [86-128] 112 (09/15 0450) Resp:  [15-43] 30 (09/15 0740) BP: (95-150)/(55-80) 136/76 mmHg (09/15 0740) SpO2:  [98 %-100 %] 100 % (09/15 0740) FiO2 (%):  [40 %] 40 % (09/14 0918) Weight:  [123 lb 3.8 oz (55.9 kg)] 123 lb 3.8 oz (55.9 kg) (09/15 0423) HEMODYNAMICS:   VENTILATOR SETTINGS: Vent Mode:  [-] PSV;CPAP FiO2 (%):  [40 %] 40 % PEEP:  [5 cmH20] 5 cmH20 Pressure Support:  [5 cmH20] 5 cmH20 INTAKE / OUTPUT:  Intake/Output Summary (Last 24 hours) at 03/14/14 0827 Last data filed at 03/14/14 0758  Gross per 24 hour  Intake    460 ml  Output   1815 ml  Net  -1355 ml    PHYSICAL EXAMINATION: General:  Awake and cooperative. Spanish speaking only. Communication appropriately via gestures and translator HEENT: No JVD PULM: CTA CV: Tachy, regular, no murmur AB: soft, non-distended, non tender, faint bs Ext: warm, no edema Neuro:appears intact.  LABS:  CBC  Recent Labs Lab 03/12/14 0450 03/13/14 0600 03/14/14 0530  WBC 17.1* 16.5* 16.5*  HGB 9.4* 10.2* 10.2*  HCT 28.1* 30.9* 30.9*  PLT 513* 588* 648*   Coag's No results found for this basename: APTT, INR,  in the last 168 hours BMET  Recent Labs Lab 03/12/14 0450 03/13/14 0600 03/14/14 0530  NA 137  139 137  K 3.9 3.7 4.3  CL 94* 93* 95*  CO2 BUN 29* 28* 28*  CREATININE 0.97 1.12* 0.94  GLUCOSE 85 87 92   Electrolytes  Recent Labs Lab 03/12/14 0450 03/13/14 0600 03/14/14 0530  CALCIUM 9.8 10.2 10.1  MG 2.4 2.6* 2.4  PHOS 4.0 5.3* 4.6   Sepsis Markers  Recent Labs Lab 03/08/14 0600 03/09/14 0520 03/10/14 0400  PROCALCITON 0.58  0.29 0.25   ABG  Recent Labs Lab 03/08/14 0320 03/09/14 0600  PHART 7.444 7.426  PCO2ART 36.4 39.6  PO2ART 70.5* 90.8   Liver Enzymes  Recent Labs Lab 03/09/14 0525 03/10/14 0400 03/11/14 0615  AST 31 38* 27  ALT 54* 55* 46*  ALKPHOS 88 86 89  BILITOT 0.8 0.7 0.7  ALBUMIN 2.7* 2.9* 3.0*   Cardiac Enzymes No results found for this basename: TROPONINI, PROBNP,  in the last 168 hours Glucose  Recent Labs Lab 03/13/14 1217 03/13/14 1609 03/13/14 2045 03/13/14 2350 03/14/14 0421 03/14/14 0748  GLUCAP 107* 91 96 94 98 92    Imaging Dg Chest Port 1 View  03/13/2014   CLINICAL DATA:  Assess endotracheal tube position.  EXAM: PORTABLE CHEST - 1 VIEW  COMPARISON:  Portable chest x-ray of March 12, 2014  FINDINGS: The lungs are reasonably well inflated. The retrocardiac density on the left is slightly increased. The left hemidiaphragm remains partially obscured. The cardiac silhouette is normal. The pulmonary vascularity is not engorged.  The endotracheal tube tip lies 3.1 cm above the crotch of the carina. The left internal jugular venous catheter tip lies in the proximal SVC. The esophagogastric tube tip projects below the inferior margin of the image.  IMPRESSION: There has not been significant interval change in the appearance of the chest though mild worsening of left lower lobe atelectasis is not excluded. The support tubes and lines are in appropriate position.   Electronically Signed   By: David  [place]   On: 03/13/2014 07:13   9/12 Cxr: Persistent interstitial infiltrates improved from admission but basically unchanged from 9/11 9/12 KUB: markedly distended loops of bowel 9/14 c x r decreased edema 9/15 extubated, nsc otherwise  Intake/Output Summary (Last 24 hours) at 03/14/14 0827 Last data filed at 03/14/14 0758  Gross per 24 hour  Intake    460 ml  Output   1815 ml  Net  -1355 ml    ASSESSMENT / PLAN:  PULMONARY OETT 9/3>>9/8 (self extubated &  re-intubated)>>>9/14 A: Acute hypoxemic respiratory failure (resolved) Multilobar, LL predominant CAP > treated (regimen completed) ARDS > oxygenation improving with diuresis (resolved) 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 Repeat diuresis 9/15 and follow BMP Tele Will need cardiology input prior to discharge given regional wall motion abnormalities. 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   Global: Overall much improved. Admitetd with B CAP and ARDS, resulting in VDRF. Possible contributions of both CHF and  withdrawal delirium. Sedation needs improved now that she is extubated.  Resume diet as tolerated.  Repeat diuresis 9/15, goal to keep her dry. Consider calling Cards for eval given the (new) observation of regional wall motion abnormality and EF 35%. Mobilize. Will change to SDU status and transfer to [facility] service as of 9/16. Additional note > a letter was sent to the [place] to assist with getting family member to the [place].    Brett Canales Minor ACNP [facility] Pager [phone] till 3 pm If no answer page [phone] 03/14/2014, 8:27 AM  Levy Pupa, MD, PhD 03/14/2014, 10:38 AM [facility] [phone] or if no answer [phone]

## 2014-03-14 NOTE — Significant Event (Signed)
Called to bedside by RN. Pt. [name] [name] while trying to go to bathroom. Family member at bedside. Pt complains left hip pain.  Exam: No overt deformity of left hip. No overt bruising of left hip. Bone palpated without pain  BP 145/83  Pulse 113  Temp(Src) 98.8 F (37.1 C) (Oral)  Resp 47  Ht 5\' 6"  (1.676 m)  Wt 123 lb 3.8 oz (55.9 kg)  BMI 19.90 kg/m2  SpO2 100%  Recent Labs Lab 03/12/14 0450 03/13/14 0600 03/14/14 0530  NA 137 139 137  K 3.9 3.7 4.3  CL 94* 93* 95*  CO2 29 28 24   BUN 29* 28* 28*  CREATININE 0.97 1.12* 0.94  GLUCOSE 85 87 92    Recent Labs Lab 03/12/14 0450 03/13/14 0600 03/14/14 0530  HGB 9.4* 10.2* 10.2*  HCT 28.1* 30.9* 30.9*  WBC 17.1* 16.5* 16.5*  PLT 513* 588* 648*    No xrays at this time. Continue to monitor.  Brett Canales Delois Silvester ACNP [facility] Pager [phone] till 3 pm If no answer page [phone] 03/14/2014, 2:19 PM

## 2014-03-15 ENCOUNTER — Inpatient Hospital Stay (HOSPITAL_COMMUNITY): Payer: Self-pay

## 2014-03-15 DIAGNOSIS — I5021 Acute systolic (congestive) heart failure: Secondary | ICD-10-CM

## 2014-03-15 LAB — BASIC METABOLIC PANEL
Anion gap: 19 — ABNORMAL HIGH (ref 5–15)
BUN: 26 mg/dL — ABNORMAL HIGH (ref 6–23)
CALCIUM: 10.3 mg/dL (ref 8.4–10.5)
CO2: 23 meq/L (ref 19–32)
Chloride: 92 mEq/L — ABNORMAL LOW (ref 96–112)
Creatinine, Ser: 0.9 mg/dL (ref 0.50–1.10)
GFR calc Af Amer: 88 mL/min — ABNORMAL LOW (ref >90)
GFR calc non Af Amer: 76 mL/min — ABNORMAL LOW (ref >90)
GLUCOSE: 108 mg/dL — AB (ref 70–99)
Potassium: 4 mEq/L (ref 3.7–5.3)
Sodium: 134 mEq/L — ABNORMAL LOW (ref 137–147)

## 2014-03-15 LAB — CBC
HEMATOCRIT: 32.7 % — AB (ref 36.0–46.0)
HEMOGLOBIN: 10.8 g/dL — AB (ref 12.0–15.0)
MCH: 30.1 pg (ref 26.0–34.0)
MCHC: 33 g/dL (ref 30.0–36.0)
MCV: 91.1 fL (ref 78.0–100.0)
Platelets: 637 10*3/uL — ABNORMAL HIGH (ref 150–400)
RBC: 3.59 MIL/uL — ABNORMAL LOW (ref 3.87–5.11)
RDW: 13.7 % (ref 11.5–15.5)
WBC: 16.2 10*3/uL — ABNORMAL HIGH (ref 4.0–10.5)

## 2014-03-15 LAB — PHOSPHORUS: Phosphorus: 4 mg/dL (ref 2.3–4.6)

## 2014-03-15 LAB — GLUCOSE, CAPILLARY
GLUCOSE-CAPILLARY: 102 mg/dL — AB (ref 70–99)
GLUCOSE-CAPILLARY: 107 mg/dL — AB (ref 70–99)
GLUCOSE-CAPILLARY: 121 mg/dL — AB (ref 70–99)
Glucose-Capillary: 102 mg/dL — ABNORMAL HIGH (ref 70–99)

## 2014-03-15 LAB — MAGNESIUM: MAGNESIUM: 2.3 mg/dL (ref 1.5–2.5)

## 2014-03-15 NOTE — Progress Notes (Addendum)
Patient ID: [name] [name], female   DOB: 1965/10/10, 48 y.o.   MRN: [id number] [facility] PROGRESS NOTE  Ivani Lundeen DPT:[id number] DOB: 07/31/1965 DOA: 03/02/2014 PCP: No primary provider on file.  Brief narrative: 48 year old hispanic female (smoker) seen in ED 02/27/14 with shortness of breath, she was treated and subsequently released home. She presented to [facility] ED 03/02/2014 with respiratory distress, had respiratory and cardiac arrest status post 1 round of epi, CPR and subsequently intubated. Initially she was acidotic but has improved with ventilatory support. Patient self extubated 03/07/2014 and required re- ntubation. Finally extubated 03/13/2014. Patient was under PCCM care until and including 03/14/2014 and transferred to [facility] starting 03/15/2014. Hospital course is complicated with seems to be possible ischemic cardiomyopathy. Patient had a 2-D echo on 03/03/2014 which revealed ejection fraction of 30-35% with inferior and posterior/lateral and septal hypokinesis. Plan is for cardiac catheterization per cardiology recommendation on 03/16/2014.   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 Patient required 1 round of epinephrine, CPR and was intubated. She self extubated 03/07/2014 but has required reintubation. She was finally extubated 03/13/2014. Patient was under PCCM care and care transferred to [facility] starting 03/15/2014.  Patient was treated adequately for pneumonia. Blood cultures to date are negative. Strep pneumonia and Legionella are negative.  Patient did have Quantiferon Gold positive but negative AFB x 3 so airborne precaution stopped per infectious disease recommendations.  Respiratory status remains stable.  Chest x-ray done 03/15/2014  shows no appreciable change is from prior study 03/14/2014. There is a scarring and pleural thickening on the right, no frank airspace consolidation and no pneumothorax.  Active problems: Acute encephalopathy  Patient has history of alcohol abuse. No reports of withdrawals throughout the hospital stay.  Alcohol level was not obtained at the time of the admission. Leukocytosis  Likely secondary to community-acquired pneumonia for which patient received antibiotics appropriately. Acute decompensated systolic and diastolic CHF / ischemic cardiomyopathy  Patient was diuresed aggressively 9/12 through 9/15 per cardiology recommendations , had -8.8 L fluid balance. Plan is for cardiac catheterization on 03/16/2014.  2-D echo done 03/03/2014 showed ejection fraction 30-35%.  Continue Coreg 3.125 mg twice daily and aspirin. Colonic ileus  Improved with NG suction. Patient does not have NG tube at this time. Acute renal failure  Likely secondary to cardiac and respiratory distress, Lasix. Creatinine is now within normal limits. DVT Prophylaxis   Lovenox subcutaneous   Code Status: Full.  Family Communication:  plan of care discussed with the patient Disposition Plan: Home when stable.    IV Access:   Peripheral IV Procedures and diagnostic studies:    Dg Chest Port 1 View  03/15/2014    No appreciable change from 1 day prior. Scarring and pleural thickening on the right. Mild left base atelectasis. No frank airspace consolidation. Heart prominent but stable. No pneumothorax.    Dg Chest Port 1 View 03/14/2014  Scarring on the right. Left base atelectatic change. Mild cardiac enlargement. No pneumothorax.     CT-PA 03/02/14  No large PE (exam limited), B LAD, B LL predominant infiltrates and consolidation, small B effusions   TTE 03/03/14  Regional LV hypokinesis and dilation, EF 30-35%, dilated LA,   03/10/14 CT head Negative   03/11/14 CT abdomen  Consistent with colonic ileus,  bilateral effusions  atelectasis   Medical Consultants:   Cardiology  Other Consultants:   None  Anti-Infectives:   None    Manson Passey, MD  [facility] Pager [phone]  If 7PM-7AM, please contact night-coverage [email or web address] Password [email or web address] 03/15/2014, 10:40 AM   LOS: 13 days    HPI/Subjective: No acute overnight events.  Objective: Filed Vitals:   03/15/14 0427 03/15/14 0517 03/15/14 0800 03/15/14 0831  BP:    116/72  Pulse:    108  Temp:   98.3 F (36.8 C)   TempSrc:   Oral   Resp:    33  Height:      Weight: 57 kg (125 lb 10.6 oz)     SpO2:  98%  99%    Intake/Output Summary (Last 24 hours) at 03/15/14 1040 Last data filed at 03/15/14 0800  Gross per 24 hour  Intake    220 ml  Output    500 ml  Net   -280 ml    Exam:   General:  Pt is alert, not in acute distress  Cardiovascular: tachycardic, S1/S2, no murmurs  Respiratory: Clear to auscultation bilaterally, no wheezing  Abdomen: Soft, non tender, non distended, bowel sounds present  Extremities: Pulses DP and PT palpable bilaterally  Neuro: Grossly nonfocal  Data Reviewed: Basic Metabolic Panel:  Recent Labs Lab 03/11/14 0615 03/12/14 0450 03/13/14 0600 03/14/14 0530 03/15/14 0530  NA 139 137 139 137 134*  K 4.7 3.9 3.7 4.3 4.0  CL 99 94* 93* 95* 92*  CO2 GLUCOSE 144* 85 87 92 108*  BUN 26* 29* 28* 28* 26*  CREATININE 0.79 0.97 1.12* 0.94 0.90  CALCIUM 9.9 9.8 10.2 10.1 10.3  MG 2.5 2.4 2.6* 2.4 2.3  PHOS 3.4 4.0 5.3* 4.6 4.0   Liver Function Tests:  Recent Labs Lab 03/09/14 0525 03/10/14 0400 03/11/14 0615  AST 31 38* 27  ALT 54* 55* 46*  ALKPHOS 88 86 89  BILITOT 0.8 0.7 0.7  PROT 7.3 7.7 8.2  ALBUMIN 2.7* 2.9* 3.0*   No results found for this basename: LIPASE, AMYLASE,  in the last 168 hours No results found for this basename: AMMONIA,  in the last 168 hours CBC:  Recent Labs Lab 03/11/14 0615 03/12/14 0450 03/13/14 0600 03/14/14 0530  03/15/14 0530  WBC 17.8* 17.1* 16.5* 16.5* 16.2*  NEUTROABS  --  13.3* 12.7*  --   --   HGB 10.0* 9.4* 10.2* 10.2* 10.8*  HCT 31.2* 28.1* 30.9* 30.9* 32.7*  MCV 93.4 89.5 89.8 89.6 91.1  PLT 503* 513* 588* 648* 637*   Cardiac Enzymes: No results found for this basename: CKTOTAL, CKMB, CKMBINDEX, TROPONINI,  in the last 168 hours BNP: No components found with this basename: POCBNP,  CBG:  Recent Labs Lab 03/14/14 0748 03/14/14 1551 03/14/14 2122 03/14/14 2314 03/15/14 0759  GLUCAP 92 142* 128* 120* 102*    No results found for this or any previous visit (from the past 240 hour(s)).   Scheduled Meds: . aspirin EC  81 mg Oral Daily  . carvedilol  3.125 mg Oral BID WC  . heparin  5,000 Units Subcutaneous 3 times per day  . insulin aspart  0-15 Units Subcutaneous TID WC  . pantoprazole   40 mg Intravenous Daily

## 2014-03-15 NOTE — Progress Notes (Signed)
PT Cancellation Note  Patient Details Name: [name] [name] MRN: [id number] DOB: [birth year]   Cancelled Treatment:    Reason Eval/Treat Not Completed: Other (comment) (financial counselor with patient and interpreter,)   [name] 03/15/2014, 4:58 PM

## 2014-03-15 NOTE — Progress Notes (Signed)
Patient Name: [name] [name] Date of Encounter: 03/15/2014     Active Problems:   Respiratory failure   CAP (community acquired pneumonia)   ARDS (adult respiratory distress syndrome)   Encephalopathy acute   Acute respiratory failure with hypoxia    SUBJECTIVE Patient is in no distress today. Not tachypneic. Rhythm NSR 100/min  CURRENT MEDS . antiseptic oral rinse  7 mL Mouth Rinse QID  . aspirin EC  81 mg Oral Daily  . carvedilol  3.125 mg Oral BID WC  . chlorhexidine  15 mL Mouth Rinse BID  . heparin  5,000 Units Subcutaneous 3 times per day  . insulin aspart  0-15 Units Subcutaneous TID WC  . ipratropium-albuterol  3 mL Nebulization Q4H  . pantoprazole (PROTONIX) IV  40 mg Intravenous Daily    OBJECTIVE  Filed Vitals:   03/15/14 0000 03/15/14 0345 03/15/14 0427 03/15/14 0517  BP: 136/80 125/78    Pulse: 106 111    Temp: 98.7 F (37.1 C) 98.6 F (37 C)    TempSrc: Oral Oral    Resp: 29 40    Height:      Weight:   125 lb 10.6 oz (57 kg)   SpO2: 100% 99%  98%    Intake/Output Summary (Last 24 hours) at 03/15/14 0745 Last data filed at 03/15/14 0600  Gross per 24 hour  Intake    330 ml  Output   1000 ml  Net   -670 ml   Filed Weights   03/13/14 0207 03/14/14 0423 03/15/14 0427  Weight: 131 lb 13.4 oz (59.8 kg) 123 lb 3.8 oz (55.9 kg) 125 lb 10.6 oz (57 kg)    PHYSICAL EXAM  General: Pleasant, NAD. Does not understand [place]. No family present. Neuro: Marland Kitchen Moves all extremities spontaneously. Psych: Normal affect. HEENT:  Normal  Neck: Supple without bruits or JVD. Lungs:  Resp regular and unlabored, CTA. Heart: RRR no s3, s4, or murmurs. Abdomen: Soft, non-tender, non-distended, BS + x 4.  Extremities: No clubbing, cyanosis or edema. DP/PT/Radials 2+ and equal bilaterally.  Accessory Clinical Findings  CBC  Recent Labs  03/13/14 0600 03/14/14 0530 03/15/14 0530  WBC 16.5* 16.5* 16.2*  NEUTROABS 12.7*  --   --   HGB 10.2* 10.2* 10.8*  HCT  30.9* 30.9* 32.7*  MCV 89.8 89.6 91.1  PLT 588* 648* 637*   Basic Metabolic Panel  Recent Labs  03/14/14 0530 03/15/14 0530  NA 137 134*  K 4.3 4.0  CL 95* 92*  CO2 24 23  GLUCOSE 92 108*  BUN 28* 26*  CREATININE 0.94 0.90  CALCIUM 10.1 10.3  MG 2.4 2.3  PHOS 4.6 4.0   Liver Function Tests No results found for this basename: AST, ALT, ALKPHOS, BILITOT, PROT, ALBUMIN,  in the last 72 hours No results found for this basename: LIPASE, AMYLASE,  in the last 72 hours Cardiac Enzymes No results found for this basename: CKTOTAL, CKMB, CKMBINDEX, TROPONINI,  in the last 72 hours BNP No components found with this basename: POCBNP,  D-Dimer No results found for this basename: DDIMER,  in the last 72 hours Hemoglobin A1C No results found for this basename: HGBA1C,  in the last 72 hours Fasting Lipid Panel No results found for this basename: CHOL, HDL, LDLCALC, TRIG, CHOLHDL, LDLDIRECT,  in the last 72 hours Thyroid Function Tests No results found for this basename: TSH, T4TOTAL, FREET3, T3FREE, THYROIDAB,  in the last 72 hours  TELE  Sinus tachycardia  ECG  Radiology/Studies  Ct Abdomen Pelvis Wo Contrast  03/11/2014   CLINICAL DATA:  Patient on ventilator, abdominal distention  EXAM: CT ABDOMEN AND PELVIS WITHOUT CONTRAST  TECHNIQUE: Multidetector CT imaging of the abdomen and pelvis was performed following the standard protocol without IV contrast.  COMPARISON:  Abdominal radiograph 03/11/2014  FINDINGS: Oral contrast was given by NG tube. Oral contrast is seen within the stomach. Oral contrast has also progressed into small bowel and to a lesser degree through the colon with a small volume of oral contrast into the rectum. Rectum is distended and fluid-filled, with a diameter of 7.2 cm. There are air-fluid levels throughout the colon. Transverse colon is distended 2 6.7 cm. Cecum measures 7.6 cm. Small bowel is relatively decompressed. There is no evidence of free air.   Bladder is decompressed by Foley catheter. There are no abnormalities involving reproductive organs. There is trace free fluid in the pelvis. Aorta is nondilated.  Given limited evaluation without IV contrast, liver and spleen are normal. Gallbladder appears normal. Pancreas is normal. Adrenal glands and kidneys are normal. NG tube extends into the stomach with tip in the region of the antrum.  There are no acute musculoskeletal findings. There is interstitial prominence in the visualized portions of the lung bases suggesting pulmonary edema. There is cardiac enlargement. There is a small right in a moderate left pleural effusion. Bilateral lower lobe consolidation dependently is likely related to atelectasis.  IMPRESSION: 1. Findings most consistent with colonic ileus 2. Findings consistent with pulmonary edema, pleural effusions, and bilateral dependent atelectasis.   Electronically Signed   By: Esperanza Heir M.D.   On: 03/11/2014 10:19   Dg Chest 2 View  02/27/2014   CLINICAL DATA:  Mid back pain  EXAM: CHEST  2 VIEW  COMPARISON:  None.  FINDINGS: The lungs are markedly abnormal bilaterally. Within the right lung, there is volume loss. Heterogeneous opacities are scattered throughout the lung with relative lucency in the upper lobe. This may represent cavitation. There is pleural thickening at the right apex. There is more confluent opacity in the posterior upper lobe, likely the right. Underlying mass at the right apex is not excluded.  Date heterogeneous opacities are present at the left base. There is irregular opacity extending into the left apex as well. These densities are predominantly linear.  IMPRESSION: Abnormal lungs as described. An inflammatory process or malignancy are not excluded. Comparison with prior studies would be helpful. If none are available, CT chest is recommended to exclude mass.   Electronically Signed   By: Maryclare Bean M.D.   On: 02/27/2014 12:08   Ct Head Wo  Contrast  03/10/2014   CLINICAL DATA:  Encephalopathy, altered mental status.  EXAM: CT HEAD WITHOUT CONTRAST  TECHNIQUE: Contiguous axial images were obtained from the base of the skull through the vertex without intravenous contrast.  COMPARISON:  None.  FINDINGS: The ventricles and sulci are normal. No intraparenchymal hemorrhage, mass effect nor midline shift. No acute large vascular territory infarcts.  No abnormal extra-axial fluid collections. Basal cisterns are patent.  No skull fracture. The included ocular globes and orbital contents are non-suspicious. The mastoid aircells and included paranasal sinuses are well-aerated. Life support lines seen on the scout view.  IMPRESSION: No acute intracranial process.  Normal noncontrast CT of the head.   Electronically Signed   By: Awilda Metro   On: 03/10/2014 01:01   Ct Chest W Contrast  02/27/2014   CLINICAL DATA:  Shortness of breath.  Chest and back pain. Right apical opacity on chest radiograph.  EXAM: CT CHEST WITH CONTRAST  TECHNIQUE: Multidetector CT imaging of the chest was performed during intravenous contrast administration.  CONTRAST:  80mL OMNIPAQUE IOHEXOL 300 MG/ML  SOLN  COMPARISON:  None.  FINDINGS: Mediastinum/Hilar Regions: Mild soft tissue density seen in the right paratracheal region measuring 1.3 cm on image 13. Mild subcarinal lymphadenopathy also seen measuring 1.3 cm. No evidence of lymph node calcification.  Other Thoracic Lymphadenopathy:  None.  Lungs: Asymmetric pleural-parenchymal opacity in the right lung apex shows associated peripheral bronchiectasis, consistent with scarring. Other areas of scarring are also seen bilaterally involving the right lung greater than left.  Diffuse thickening of pulmonary interlobular septi also demonstrated with bibasilar predominance, and mild interstitial edema cannot be excluded. No evidence of pulmonary consolidation or discrete pulmonary mass. No evidence of central endobronchial  obstruction.  Pleura: Tiny bilateral pleural effusions noted, left side greater than right.  Vascular/Cardiac: No thoracic aortic aneurysm or other significant abnormality identified.  Musculoskeletal:  No suspicious bone lesions identified.  Other:  None.  IMPRESSION: Tiny bilateral pleural effusions and thickening of pulmonary interlobular septi in both lung bases. Although this could be due to chronic interstitial lung disease, mild edema or interstitial pneumonitis cannot be excluded. Continued chest radiographic followup is recommended.  Bilateral pleural- parenchymal scarring with associated traction bronchiectasis in right lung apex.  Mild mediastinal lymphadenopathy in the right paratracheal and subcarinal regions, which is nonspecific and may be reactive in etiology. Continued followup by CT is recommended in 3-6 months.   Electronically Signed   By: Myles Rosenthal M.D.   On: 02/27/2014 14:31   Ct Angio Chest Pe W/cm &/or Wo Cm  03/02/2014   CLINICAL DATA:  Respiratory and cardiac arrest. Evaluate for pulmonary embolism  EXAM: CT ANGIOGRAPHY CHEST WITH CONTRAST  TECHNIQUE: Multidetector CT imaging of the chest was performed using the standard protocol during bolus administration of intravenous contrast. Multiplanar CT image reconstructions and MIPs were obtained to evaluate the vascular anatomy.  CONTRAST:  OMNIPAQUE IOHEXOL 350 MG/ML SOLN  COMPARISON:  02/27/2014  FINDINGS: THORACIC INLET/BODY WALL:  Interval tracheal and esophageal intubation. The nasogastric tube continues into the stomach at least. Endotracheal tube ends 1 cm above the carina, deeper than on preceding chest x-ray.  There is symmetric ill-defined fluid within the visceral neck compartment, without definitive cause. This could be reactive to recent intubation or from a generalized edematous state. No fluid collection or subcutaneous emphysema.  MEDIASTINUM:  Cardiomegaly, mainly related to left ventricular enlargement. No pericardial  effusion. No significant atherosclerotic change. No acute aortic findings.  Evaluation of the pulmonary arterial tree is limited at multiple levels due to respiratory motion. In general the exam is diagnostic to the level of the proximal most segmental pulmonary arteries. No evidence of pulmonary embolism.  Enlargement of subcarinal and hilar lymph nodes which is symmetric and presumably reactive. Mediastinal node followup has been previously recommended.  LUNG WINDOWS:  Diffuse lung consolidation. There is a dependent predilection, although the consolidation is panlobar. No definitive debris within the central airways. The right upper lobe bronchi are distorted and narrowed, with chronic hyperlucency of the right upper lobe consistent with air trapping. The vessels in this region are likewise attenuated. Given posterior segment calcifications bilaterally, this is likely postinfectious scarring - possibly remote TB. Small bilateral pleural effusions without loculation or abnormal pleural enhancement.  UPPER ABDOMEN:  No acute findings.  OSSEOUS:  No acute findings.  Review of the MIP images confirms the above findings.  IMPRESSION: 1. Multi lobar pneumonia with small pleural effusions. The pattern and history of cardiac arrest implicates aspiration. 2. As permitted by moderate motion degradation, no evidence of pulmonary embolism. 3. Endotracheal tube lower than at time of placement, now ending 1 cm above the carina. 4. Left ventricular enlargement.  No definite pulmonary edema. 5. Right upper lobe scarring with air trapping, likely from remote infection.   Electronically Signed   By: Tiburcio Pea M.D.   On: 03/02/2014 13:12   Dg Chest Port 1 View  03/14/2014   CLINICAL DATA:  Atelectasis  EXAM: PORTABLE CHEST - 1 VIEW  COMPARISON:  March 13, 2014  FINDINGS: Endotracheal and nasogastric tube have been removed. No pneumothorax. Central catheter tip is in the superior vena cava. No pneumothorax. There is  asymmetric apical pleural thickening on the right with scattered scarring in the right lung, stable. There is atelectatic change in the left base. There is no appreciable consolidation.  Heart is mildly enlarged with pulmonary vascularity within normal limits. No adenopathy.  IMPRESSION: Scarring on the right. Left base atelectatic change. Mild cardiac enlargement. No pneumothorax.   Electronically Signed   By: Bretta Bang M.D.   On: 03/14/2014 07:08   Dg Chest Port 1 View  03/13/2014   CLINICAL DATA:  Assess endotracheal tube position.  EXAM: PORTABLE CHEST - 1 VIEW  COMPARISON:  Portable chest x-ray of March 12, 2014  FINDINGS: The lungs are reasonably well inflated. The retrocardiac density on the left is slightly increased. The left hemidiaphragm remains partially obscured. The cardiac silhouette is normal. The pulmonary vascularity is not engorged.  The endotracheal tube tip lies 3.1 cm above the crotch of the carina. The left internal jugular venous catheter tip lies in the proximal SVC. The esophagogastric tube tip projects below the inferior margin of the image.  IMPRESSION: There has not been significant interval change in the appearance of the chest though mild worsening of left lower lobe atelectasis is not excluded. The support tubes and lines are in appropriate position.   Electronically Signed   By: David  [place]   On: 03/13/2014 07:13   Dg Chest Port 1 View  03/12/2014   CLINICAL DATA:  Evaluate endotracheal tube placement.  EXAM: PORTABLE CHEST - 1 VIEW  COMPARISON:  Chest x-ray 03/11/2014.  FINDINGS: An endotracheal tube is in place with tip 3.9 cm above the carina. There is a left-sided internal jugular central venous catheter with tip terminating in the mid superior vena cava. A nasogastric tube is seen extending into the stomach, however, the tip of the nasogastric tube extends below the lower margin of the image. Lung volumes remain slightly low, but have improved compared to the  recent prior examination. Again noted is a background of chronic increased interstitial markings throughout the lungs bilaterally with hyperlucency and architectural distortion in the right upper lobe, similar to multiple prior examinations. No definite acute consolidative airspace disease. No pleural effusions. No evidence of pulmonary edema. Mild cardiomegaly is unchanged. The patient is rotated to the right on today's exam, resulting in distortion of the mediastinal contours and reduced diagnostic sensitivity and specificity for mediastinal pathology.  IMPRESSION: 1. Support apparatus, as above. 2. Chronic changes in the lungs redemonstrated, suggestive of extensive areas of scarring related to prior episodes of infection or inflammation (e.g., ARDS).   Electronically Signed   By: Trudie Reed M.D.   On: 03/12/2014 07:20   Dg  Chest Port 1 View  03/11/2014   CLINICAL DATA:  Evaluate endotracheal tube  EXAM: PORTABLE CHEST - 1 VIEW  COMPARISON:  03/10/2014  FINDINGS: Endotracheal tube tip is identified 1.7 cm above the carina. No change left central line. Stable cardiac enlargement exaggerated by limited inspiratory effect. There is fibrotic change in the right middle lobe. There is stable mild diffuse interstitial change.  There is gaseous distention of the stomach. NG tube is unchanged, again seen passing into the stomach.  IMPRESSION: No significant interval change   Electronically Signed   By: Esperanza Heir M.D.   On: 03/11/2014 07:20   Dg Chest Port 1 View  03/10/2014   CLINICAL DATA:  Hypoxia  EXAM: PORTABLE CHEST - 1 VIEW  COMPARISON:  March 09, 2014  FINDINGS: Endotracheal tube tip is 2.0 cm above the carina. Nasogastric tube tip and side port are in the stomach. Central catheter tip is in the superior vena cava. No pneumothorax. There is left base consolidation. There is scarring in the right mid and lower lung zones. There is also right apical pleural thickening which is stable. Heart is  mildly enlarged with pulmonary vascularity within normal limits. No adenopathy.  IMPRESSION: Tube and catheter positions as described without pneumothorax. Persistent left lower lobe consolidation. Areas of scarring on the right appear stable.   Electronically Signed   By: Bretta Bang M.D.   On: 03/10/2014 12:08   Dg Chest Port 1 View  03/09/2014   CLINICAL DATA:  Decreased oxygen saturation  EXAM: PORTABLE CHEST - 1 VIEW  COMPARISON:  03/08/2014  FINDINGS: An endotracheal tube is again identified with the tip 10 mm above the carina. It is stable in appearance. A left central venous line is again noted and stable. Nasogastric catheter is coiled within the stomach. Bilateral nipple shadows are seen. Bilateral infiltrates are noted with improved aeration particularly in the left lung base.  IMPRESSION: Mild infiltrates are again identified bilaterally. Improved aeration is noted.  The endotracheal tube still lies low within the trachea and could be withdrawn 1-2 cm. No new focal abnormality is seen.   Electronically Signed   By: Alcide Clever M.D.   On: 03/09/2014 20:22   Dg Chest Port 1 View  03/08/2014   CLINICAL DATA:  Check endotracheal tube position  EXAM: PORTABLE CHEST - 1 VIEW  COMPARISON:  03/07/2014  FINDINGS: Endotracheal tube now lies 1 cm above the carina and is relatively stable from the prior exam. The cardiac shadow is stable. A left jugular central line and nasogastric catheter are again seen. Bilateral basilar infiltrates are seen as well as apical changes on the right. The overall appearance is stable.  IMPRESSION: Stable bilateral infiltrates.  Tubes and lines as described above. Again the endotracheal tube could be withdrawn 1-2 cm.   Electronically Signed   By: Alcide Clever M.D.   On: 03/08/2014 07:36   Dg Chest Port 1 View  03/07/2014   CLINICAL DATA:  Cough.  Intubation.  EXAM: PORTABLE CHEST - 1 VIEW  COMPARISON:  03/07/2014  FINDINGS: Endotracheal tube tip is localized at the  carina. Retraction about 2 cm is recommended. Positioning of the tube is unchanged since previous studies. Enteric tube tip is off the field of view but below the left hemidiaphragm. Left central venous catheter is projected over the mid SVC region. Cardiac enlargement with pulmonary vascular congestion and bilateral parenchymal infiltrates. Findings likely due to edema although pneumonia or ARDS could also have this appearance.  Bilateral pleural effusions. No significant change since previous study.  IMPRESSION: Cardiac enlargement with pulmonary vascular congestion and diffuse pulmonary infiltration. Bilateral pleural effusions. Appliances are unchanged in position. The endotracheal tube the tip is again noted at the carina and retraction of about 2 cm is again recommended.   Electronically Signed   By: Burman Nieves M.D.   On: 03/07/2014 22:07   Dg Chest Port 1 View  03/07/2014   CLINICAL DATA:  Intubated patient.  EXAM: PORTABLE CHEST - 1 VIEW  COMPARISON:  03/06/2014  FINDINGS: Endotracheal tube tip lies at the chronic. It appears unchanged from the previous exam.  Hazy bilateral airspace lung opacities, most confluent in the right perihilar and lower lung zone, have increased, some of which is likely due to lower lung volumes on the current exam. There are small bilateral pleural effusions. Scarring and pleural thickening at the apices, greater on the right, is stable. No pneumothorax.  Left internal jugular central venous line tip is well positioned in the lower superior vena cava. Nasogastric tube tip passes below the diaphragm and below the included field of view.  IMPRESSION: 1. Tip of the endotracheal tube projects at the carina. Consider retracting 1-2 cm for more optimal positioning. 2. Lung aeration appears worsened when compared to the previous day's study although the difference may be due to lower lung volumes on the current exam.   Electronically Signed   By: Amie Portland M.D.   On:  03/07/2014 07:32   Dg Chest Port 1 View  03/06/2014   CLINICAL DATA:  Respiratory failure.  EXAM: PORTABLE CHEST - 1 VIEW  COMPARISON:  03/04/2014.  FINDINGS: Endotracheal tube tip is at the level of carina. Recommend retracting by 2-3 cm to avoid mainstem bronchus intubation with change of patients head position.  Nasogastric tube courses below the diaphragm. Tip is not included on the present exam.  Left central line tip projects at the level of the mid to distal superior vena cava. No gross pneumothorax.  Right apical parenchymal changes similar to prior exam.  Interval improved aeration mid to lower lung zones.  Persistent pulmonary vascular congestion/ pulmonary edema.  Retrocardiac opacity may represent atelectasis with pleural fluid. Infiltrate not excluded. Recommend followup until clearance.  Cardiomegaly.  IMPRESSION: Endotracheal tube tip needs to be repositioned as discussed above.  Interval slight improved aeration mid to lower lung zones. Please see above discussion.  These results were called by telephone at the time of interpretation on 03/06/2014 at 8:05 am to [facility] patient's nurse, who verbally acknowledged these results.   Electronically Signed   By: Bridgett Larsson M.D.   On: 03/06/2014 08:08   Dg Chest Port 1 View  03/04/2014   CLINICAL DATA:  ETT placement  EXAM: PORTABLE CHEST - 1 VIEW  COMPARISON:  03/03/2014  FINDINGS: Endotracheal tube terminates 7 mm above the carina.  Multifocal patchy opacities in the lungs bilaterally, worrisome for multifocal pneumonia, less likely moderate interstitial edema. Small bilateral pleural effusions. No pneumothorax.  The heart is top-normal in size.  Left IJ venous catheter terminates at the cavoatrial junction. Enteric tube courses below the diaphragm.  IMPRESSION: Endotracheal tube terminates 7 mm above the carina.  Multifocal patchy opacities in the lungs bilaterally, worrisome for multifocal pneumonia.  Small bilateral pleural effusions.   Electronically  Signed   By: Charline Bills M.D.   On: 03/04/2014 07:11   Dg Chest Port 1 View  03/03/2014   CLINICAL DATA:  Endotracheal tube patient, tube  placed yesterday, evaluate lungs  EXAM: PORTABLE CHEST - 1 VIEW  COMPARISON:  03/02/2014  FINDINGS: No change in position of endotracheal tube with tip 1 cm above the carina. Left subclavian central line and NG tube unchanged.  Extensive airspace opacity over the right middle and lower lung zones, stable allowing for differences in technique. Diffuse mixed alveolar and interstitial opacity throughout the left lung, most consolidated in the lower lobe, also stable.  IMPRESSION: No change when compared to radiograph performed yesterday.   Electronically Signed   By: Esperanza Heir M.D.   On: 03/03/2014 09:20   Dg Chest Port 1 View  03/02/2014   CLINICAL DATA:  Central line placement  EXAM: PORTABLE CHEST - 1 VIEW  COMPARISON:  03/02/2014  FINDINGS: Endotracheal tube 1 cm above the carina.  Left jugular catheter placement with the tip in the SVC. No pneumothorax. NG tube in the stomach  Diffuse bilateral airspace disease unchanged. Small bilateral effusions unchanged. Question pneumonia versus heart failure  IMPRESSION: Endotracheal tube 1 cm above the carina  Sent factors central line placement  Diffuse bilateral airspace disease and bilateral effusions unchanged.   Electronically Signed   By: Marlan Palau M.D.   On: 03/02/2014 15:45   Dg Chest Portable 1 View  03/02/2014   CLINICAL DATA:  Cardiac and respiratory arrest  EXAM: PORTABLE CHEST - 1 VIEW  COMPARISON:  Portable chest x-ray of March 02, 2014 at 0952 hr.  FINDINGS: The patient has undergone interval intubation of the trachea. The tip of the tube lies approximately 3 0.6 cm above the crotch of the carina. The esophagogastric tube tip projects off the inferior margin of the image. There are external pacing and defibrillation pannus present. The lungs are adequately inflated but exhibit diffusely increased  interstitial markings more conspicuous than earlier today. There is no pneumothorax. A small pleural effusion on the right is suspected but is unchanged.  IMPRESSION: The endotracheal and esophagogastric tubes appear to be in appropriate position. There is diffuse interstitial edema more conspicuous than on the earlier study.   Electronically Signed   By: David  [place]   On: 03/02/2014 11:09   Dg Chest Portable 1 View  03/02/2014   CLINICAL DATA:  Short of breath  EXAM: PORTABLE CHEST - 1 VIEW  COMPARISON:  02/27/2014  FINDINGS: Cardiac enlargement with progression of diffuse bilateral airspace disease, possible pneumonia versus infection. Right pleural effusion slightly larger. No left effusion. Bibasilar atelectasis has progressed.  IMPRESSION: Worsening bilateral airspace disease. This may represent pulmonary edema versus infection.   Electronically Signed   By: Marlan Palau M.D.   On: 03/02/2014 10:16   Dg Abd Portable 1v  03/11/2014   CLINICAL DATA:  Abdominal distention.  EXAM: PORTABLE ABDOMEN - 1 VIEW  COMPARISON:  Abdominal radiograph performed 03/10/2014  FINDINGS: There is diffuse dilatation of small and large bowel loops throughout the abdomen and pelvis, and of the stomach, compatible with ileus. This is similar in appearance to the prior study. The patient's enteric tube is noted ending at the antrum of the stomach. No free intra-abdominal air is seen, though evaluation for free air is limited on a single supine view.  The visualized osseous structures are within normal limits; the sacroiliac joints are unremarkable in appearance. Mild bibasilar atelectasis is noted.  IMPRESSION: Relatively stable appearance to diffuse ileus. No free intra-abdominal air seen.   Electronically Signed   By: Roanna Raider M.D.   On: 03/11/2014 06:12   Dg Abd  Portable 1v  03/10/2014   CLINICAL DATA:  Abdominal distention  EXAM: PORTABLE ABDOMEN - 1 VIEW  COMPARISON:  None.  FINDINGS: There is generalized bowel  dilatation. A small amount of air is present in the rectum. No free air is seen on this supine examination. Nasogastric tube tip and side port are in the body of the stomach. There are phleboliths in the pelvis.  IMPRESSION: The bowel gas pattern is most consistent with ileus, although a degree of distal obstruction cannot be entirely excluded. No free air is seen on this supine examination. Nasogastric tube tip and side port in stomach.   Electronically Signed   By: Bretta Bang M.D.   On: 03/10/2014 12:07    ASSESSMENT AND PLAN  Cardiomyopathy, possibly ischemic vs stress cardiomyopathy.  Plan: Will plan for left heart cath for Thursday.  Will need to discuss/explain to patient and family sometime today when interpreter is available. Signed, Cassell Clement MD

## 2014-03-15 NOTE — Progress Notes (Signed)
Bed alarm sounding found pt attempting to get out of bed. Pt grimacing and patting legs. Assisting to bedside commode and gave pt water. Explained need to stay in bed. She requires 2 assist very unsteady gait. Bed alarm reset. Call bell with pt and pt demonstrated use. Meds given for pain.

## 2014-03-16 ENCOUNTER — Encounter (HOSPITAL_COMMUNITY)
Admission: EM | Disposition: A | Discharge status: Home / Self Care | Payer: Self-pay | Source: Home / Self Care | Attending: Internal Medicine

## 2014-03-16 DIAGNOSIS — I428 Other cardiomyopathies: Secondary | ICD-10-CM

## 2014-03-16 HISTORY — PX: LEFT HEART CATHETERIZATION WITH CORONARY ANGIOGRAM: SHX5451

## 2014-03-16 LAB — GLUCOSE, CAPILLARY
GLUCOSE-CAPILLARY: 116 mg/dL — AB (ref 70–99)
GLUCOSE-CAPILLARY: 118 mg/dL — AB (ref 70–99)
GLUCOSE-CAPILLARY: 137 mg/dL — AB (ref 70–99)

## 2014-03-16 LAB — CBC
HCT: 30.5 % — ABNORMAL LOW (ref 36.0–46.0)
Hemoglobin: 10.3 g/dL — ABNORMAL LOW (ref 12.0–15.0)
MCH: 30 pg (ref 26.0–34.0)
MCHC: 33.8 g/dL (ref 30.0–36.0)
MCV: 88.9 fL (ref 78.0–100.0)
PLATELETS: 608 10*3/uL — AB (ref 150–400)
RBC: 3.43 MIL/uL — ABNORMAL LOW (ref 3.87–5.11)
RDW: 13.4 % (ref 11.5–15.5)
WBC: 14.9 10*3/uL — ABNORMAL HIGH (ref 4.0–10.5)

## 2014-03-16 LAB — CREATININE, SERUM
Creatinine, Ser: 0.83 mg/dL (ref 0.50–1.10)
GFR calc Af Amer: >90 mL/min (ref >90)
GFR calc non Af Amer: 83 mL/min — ABNORMAL LOW (ref >90)

## 2014-03-16 SURGERY — LEFT HEART CATHETERIZATION WITH CORONARY ANGIOGRAM
Anesthesia: LOCAL

## 2014-03-16 MED ORDER — ASPIRIN 81 MG PO CHEW: 81.0000 mg | CHEWABLE_TABLET | ORAL | Status: DC

## 2014-03-16 MED ORDER — ONDANSETRON HCL 4 MG/2ML IJ SOLN: 4.0000 mg | Freq: Four times a day (QID) | INTRAMUSCULAR | Status: DC | PRN

## 2014-03-16 MED ORDER — GUAIFENESIN-CODEINE 100-10 MG/5ML PO SOLN
5.0000 mL | Freq: Once | ORAL | Status: AC
  Administered 2014-03-16: 5 mL via ORAL

## 2014-03-16 MED ORDER — SODIUM CHLORIDE 0.9 % IJ SOLN: 3.0000 mL | INTRAMUSCULAR | Status: DC | PRN

## 2014-03-16 MED ORDER — ACETAMINOPHEN 325 MG PO TABS: 650.0000 mg | ORAL_TABLET | ORAL | Status: DC | PRN

## 2014-03-16 MED ORDER — SODIUM CHLORIDE 0.9 % IV SOLN: 250.0000 mL | INTRAVENOUS | Status: DC | PRN

## 2014-03-16 MED ORDER — SODIUM CHLORIDE 0.9 % IJ SOLN
3.0000 mL | Freq: Two times a day (BID) | INTRAMUSCULAR | Status: DC
  Administered 2014-03-16 – 2014-03-17 (×3): 3 mL via INTRAVENOUS

## 2014-03-16 MED ORDER — HEPARIN SODIUM (PORCINE) 5000 UNIT/ML IJ SOLN
5000.0000 [IU] | Freq: Three times a day (TID) | INTRAMUSCULAR | Status: DC
Start: 2014-03-16 — End: 2014-03-16

## 2014-03-16 MED ORDER — SODIUM CHLORIDE 0.9 % IV SOLN
INTRAVENOUS | Status: AC
  Administered 2014-03-16: 16:00:00 via INTRAVENOUS

## 2014-03-16 NOTE — H&P (View-Only) (Signed)
Patient Name: [name] [name] Date of Encounter: 03/16/2014     Active Problems:   Respiratory failure   CAP (community acquired pneumonia)   ARDS (adult respiratory distress syndrome)   Encephalopathy acute   Acute respiratory failure with hypoxia    SUBJECTIVE  Patient has done well overnight.  No chest pain. We used telephone interpreter this am to explain cath and risks to her.  CURRENT MEDS . antiseptic oral rinse  7 mL Mouth Rinse QID  . aspirin  81 mg Oral Pre-Cath  . aspirin EC  81 mg Oral Daily  . carvedilol  3.125 mg Oral BID WC  . chlorhexidine  15 mL Mouth Rinse BID  . heparin  5,000 Units Subcutaneous 3 times per day  . insulin aspart  0-15 Units Subcutaneous TID WC  . pantoprazole (PROTONIX) IV  40 mg Intravenous Daily  . sodium chloride  3 mL Intravenous Q12H    OBJECTIVE  Filed Vitals:   03/15/14 1925 03/15/14 2000 03/16/14 0000 03/16/14 0400  BP:  120/86 126/76 125/74  Pulse:  105 104 102  Temp: 98.8 F (37.1 C)  99.8 F (37.7 C) 98.4 F (36.9 C)  TempSrc: Oral   Oral  Resp:  43 52 42  Height:      Weight:    123 lb 10.9 oz (56.1 kg)  SpO2:  98% 95% 96%    Intake/Output Summary (Last 24 hours) at 03/16/14 0736 Last data filed at 03/16/14 0400  Gross per 24 hour  Intake    920 ml  Output    650 ml  Net    270 ml   Filed Weights   03/14/14 0423 03/15/14 0427 03/16/14 0400  Weight: 123 lb 3.8 oz (55.9 kg) 125 lb 10.6 oz (57 kg) 123 lb 10.9 oz (56.1 kg)    PHYSICAL EXAM  General: Pleasant, NAD. Neuro: Alert and oriented X 3. Moves all extremities spontaneously. Psych: Normal affect. HEENT:  Normal  Neck: Supple without bruits or JVD. Lungs:  Resp regular and unlabored, CTA. Heart: RRR no s3, s4, or murmurs. Abdomen: Soft, non-tender, non-distended, BS + x 4.  Extremities: No clubbing, cyanosis or edema. DP/PT/Radials 2+ and equal bilaterally.  Accessory Clinical Findings  CBC  Recent Labs  03/14/14 0530 03/15/14 0530  WBC  16.5* 16.2*  HGB 10.2* 10.8*  HCT 30.9* 32.7*  MCV 89.6 91.1  PLT 648* 637*   Basic Metabolic Panel  Recent Labs  03/14/14 0530 03/15/14 0530  NA 137 134*  K 4.3 4.0  CL 95* 92*  CO2 24 23  GLUCOSE 92 108*  BUN 28* 26*  CREATININE 0.94 0.90  CALCIUM 10.1 10.3  MG 2.4 2.3  PHOS 4.6 4.0   Liver Function Tests No results found for this basename: AST, ALT, ALKPHOS, BILITOT, PROT, ALBUMIN,  in the last 72 hours No results found for this basename: LIPASE, AMYLASE,  in the last 72 hours Cardiac Enzymes No results found for this basename: CKTOTAL, CKMB, CKMBINDEX, TROPONINI,  in the last 72 hours BNP No components found with this basename: POCBNP,  D-Dimer No results found for this basename: DDIMER,  in the last 72 hours Hemoglobin A1C No results found for this basename: HGBA1C,  in the last 72 hours Fasting Lipid Panel No results found for this basename: CHOL, HDL, LDLCALC, TRIG, CHOLHDL, LDLDIRECT,  in the last 72 hours Thyroid Function Tests No results found for this basename: TSH, T4TOTAL, FREET3, T3FREE, THYROIDAB,  in the last 72 hours  TELE  Sinus tachycardia  ECG    Radiology/Studies  Ct Abdomen Pelvis Wo Contrast  03/11/2014   CLINICAL DATA:  Patient on ventilator, abdominal distention  EXAM: CT ABDOMEN AND PELVIS WITHOUT CONTRAST  TECHNIQUE: Multidetector CT imaging of the abdomen and pelvis was performed following the standard protocol without IV contrast.  COMPARISON:  Abdominal radiograph 03/11/2014  FINDINGS: Oral contrast was given by NG tube. Oral contrast is seen within the stomach. Oral contrast has also progressed into small bowel and to a lesser degree through the colon with a small volume of oral contrast into the rectum. Rectum is distended and fluid-filled, with a diameter of 7.2 cm. There are air-fluid levels throughout the colon. Transverse colon is distended 2 6.7 cm. Cecum measures 7.6 cm. Small bowel is relatively decompressed. There is no  evidence of free air.  Bladder is decompressed by Foley catheter. There are no abnormalities involving reproductive organs. There is trace free fluid in the pelvis. Aorta is nondilated.  Given limited evaluation without IV contrast, liver and spleen are normal. Gallbladder appears normal. Pancreas is normal. Adrenal glands and kidneys are normal. NG tube extends into the stomach with tip in the region of the antrum.  There are no acute musculoskeletal findings. There is interstitial prominence in the visualized portions of the lung bases suggesting pulmonary edema. There is cardiac enlargement. There is a small right in a moderate left pleural effusion. Bilateral lower lobe consolidation dependently is likely related to atelectasis.  IMPRESSION: 1. Findings most consistent with colonic ileus 2. Findings consistent with pulmonary edema, pleural effusions, and bilateral dependent atelectasis.   Electronically Signed   By: Esperanza Heir M.D.   On: 03/11/2014 10:19   Dg Chest 2 View  02/27/2014   CLINICAL DATA:  Mid back pain  EXAM: CHEST  2 VIEW  COMPARISON:  None.  FINDINGS: The lungs are markedly abnormal bilaterally. Within the right lung, there is volume loss. Heterogeneous opacities are scattered throughout the lung with relative lucency in the upper lobe. This may represent cavitation. There is pleural thickening at the right apex. There is more confluent opacity in the posterior upper lobe, likely the right. Underlying mass at the right apex is not excluded.  Date heterogeneous opacities are present at the left base. There is irregular opacity extending into the left apex as well. These densities are predominantly linear.  IMPRESSION: Abnormal lungs as described. An inflammatory process or malignancy are not excluded. Comparison with prior studies would be helpful. If none are available, CT chest is recommended to exclude mass.   Electronically Signed   By: Maryclare Bean M.D.   On: 02/27/2014 12:08   Ct Head  Wo Contrast  03/10/2014   CLINICAL DATA:  Encephalopathy, altered mental status.  EXAM: CT HEAD WITHOUT CONTRAST  TECHNIQUE: Contiguous axial images were obtained from the base of the skull through the vertex without intravenous contrast.  COMPARISON:  None.  FINDINGS: The ventricles and sulci are normal. No intraparenchymal hemorrhage, mass effect nor midline shift. No acute large vascular territory infarcts.  No abnormal extra-axial fluid collections. Basal cisterns are patent.  No skull fracture. The included ocular globes and orbital contents are non-suspicious. The mastoid aircells and included paranasal sinuses are well-aerated. Life support lines seen on the scout view.  IMPRESSION: No acute intracranial process.  Normal noncontrast CT of the head.   Electronically Signed   By: Awilda Metro   On: 03/10/2014 01:01   Ct Chest W Contrast  02/27/2014   CLINICAL DATA:  Shortness of breath. Chest and back pain. Right apical opacity on chest radiograph.  EXAM: CT CHEST WITH CONTRAST  TECHNIQUE: Multidetector CT imaging of the chest was performed during intravenous contrast administration.  CONTRAST:  80mL OMNIPAQUE IOHEXOL 300 MG/ML  SOLN  COMPARISON:  None.  FINDINGS: Mediastinum/Hilar Regions: Mild soft tissue density seen in the right paratracheal region measuring 1.3 cm on image 13. Mild subcarinal lymphadenopathy also seen measuring 1.3 cm. No evidence of lymph node calcification.  Other Thoracic Lymphadenopathy:  None.  Lungs: Asymmetric pleural-parenchymal opacity in the right lung apex shows associated peripheral bronchiectasis, consistent with scarring. Other areas of scarring are also seen bilaterally involving the right lung greater than left.  Diffuse thickening of pulmonary interlobular septi also demonstrated with bibasilar predominance, and mild interstitial edema cannot be excluded. No evidence of pulmonary consolidation or discrete pulmonary mass. No evidence of central endobronchial  obstruction.  Pleura: Tiny bilateral pleural effusions noted, left side greater than right.  Vascular/Cardiac: No thoracic aortic aneurysm or other significant abnormality identified.  Musculoskeletal:  No suspicious bone lesions identified.  Other:  None.  IMPRESSION: Tiny bilateral pleural effusions and thickening of pulmonary interlobular septi in both lung bases. Although this could be due to chronic interstitial lung disease, mild edema or interstitial pneumonitis cannot be excluded. Continued chest radiographic followup is recommended.  Bilateral pleural- parenchymal scarring with associated traction bronchiectasis in right lung apex.  Mild mediastinal lymphadenopathy in the right paratracheal and subcarinal regions, which is nonspecific and may be reactive in etiology. Continued followup by CT is recommended in 3-6 months.   Electronically Signed   By: Myles Rosenthal M.D.   On: 02/27/2014 14:31   Ct Angio Chest Pe W/cm &/or Wo Cm  03/02/2014   CLINICAL DATA:  Respiratory and cardiac arrest. Evaluate for pulmonary embolism  EXAM: CT ANGIOGRAPHY CHEST WITH CONTRAST  TECHNIQUE: Multidetector CT imaging of the chest was performed using the standard protocol during bolus administration of intravenous contrast. Multiplanar CT image reconstructions and MIPs were obtained to evaluate the vascular anatomy.  CONTRAST:  OMNIPAQUE IOHEXOL 350 MG/ML SOLN  COMPARISON:  02/27/2014  FINDINGS: THORACIC INLET/BODY WALL:  Interval tracheal and esophageal intubation. The nasogastric tube continues into the stomach at least. Endotracheal tube ends 1 cm above the carina, deeper than on preceding chest x-ray.  There is symmetric ill-defined fluid within the visceral neck compartment, without definitive cause. This could be reactive to recent intubation or from a generalized edematous state. No fluid collection or subcutaneous emphysema.  MEDIASTINUM:  Cardiomegaly, mainly related to left ventricular enlargement. No pericardial  effusion. No significant atherosclerotic change. No acute aortic findings.  Evaluation of the pulmonary arterial tree is limited at multiple levels due to respiratory motion. In general the exam is diagnostic to the level of the proximal most segmental pulmonary arteries. No evidence of pulmonary embolism.  Enlargement of subcarinal and hilar lymph nodes which is symmetric and presumably reactive. Mediastinal node followup has been previously recommended.  LUNG WINDOWS:  Diffuse lung consolidation. There is a dependent predilection, although the consolidation is panlobar. No definitive debris within the central airways. The right upper lobe bronchi are distorted and narrowed, with chronic hyperlucency of the right upper lobe consistent with air trapping. The vessels in this region are likewise attenuated. Given posterior segment calcifications bilaterally, this is likely postinfectious scarring - possibly remote TB. Small bilateral pleural effusions without loculation or abnormal pleural enhancement.  UPPER ABDOMEN:  No  acute findings.  OSSEOUS:  No acute findings.  Review of the MIP images confirms the above findings.  IMPRESSION: 1. Multi lobar pneumonia with small pleural effusions. The pattern and history of cardiac arrest implicates aspiration. 2. As permitted by moderate motion degradation, no evidence of pulmonary embolism. 3. Endotracheal tube lower than at time of placement, now ending 1 cm above the carina. 4. Left ventricular enlargement.  No definite pulmonary edema. 5. Right upper lobe scarring with air trapping, likely from remote infection.   Electronically Signed   By: Tiburcio Pea M.D.   On: 03/02/2014 13:12   Dg Chest Port 1 View  03/15/2014   CLINICAL DATA:  Pneumonia  EXAM: PORTABLE CHEST - 1 VIEW  COMPARISON:  March 14, 2014  FINDINGS: Central catheter tip is in the superior vena cava. No pneumothorax. Areas of scarring and apical pleural thickening on the right remain stable. There is  mild atelectatic change in the left base, stable. There is no new opacity. There is no frank consolidation. Heart is mildly enlarged with pulmonary vascularity within normal limits, stable. No appreciable adenopathy.  IMPRESSION: No appreciable change from 1 day prior. Scarring and pleural thickening on the right. Mild left base atelectasis. No frank airspace consolidation. Heart prominent but stable. No pneumothorax.   Electronically Signed   By: Bretta Bang M.D.   On: 03/15/2014 08:04   Dg Chest Port 1 View  03/14/2014   CLINICAL DATA:  Atelectasis  EXAM: PORTABLE CHEST - 1 VIEW  COMPARISON:  March 13, 2014  FINDINGS: Endotracheal and nasogastric tube have been removed. No pneumothorax. Central catheter tip is in the superior vena cava. No pneumothorax. There is asymmetric apical pleural thickening on the right with scattered scarring in the right lung, stable. There is atelectatic change in the left base. There is no appreciable consolidation.  Heart is mildly enlarged with pulmonary vascularity within normal limits. No adenopathy.  IMPRESSION: Scarring on the right. Left base atelectatic change. Mild cardiac enlargement. No pneumothorax.   Electronically Signed   By: Bretta Bang M.D.   On: 03/14/2014 07:08   Dg Chest Port 1 View  03/13/2014   CLINICAL DATA:  Assess endotracheal tube position.  EXAM: PORTABLE CHEST - 1 VIEW  COMPARISON:  Portable chest x-ray of March 12, 2014  FINDINGS: The lungs are reasonably well inflated. The retrocardiac density on the left is slightly increased. The left hemidiaphragm remains partially obscured. The cardiac silhouette is normal. The pulmonary vascularity is not engorged.  The endotracheal tube tip lies 3.1 cm above the crotch of the carina. The left internal jugular venous catheter tip lies in the proximal SVC. The esophagogastric tube tip projects below the inferior margin of the image.  IMPRESSION: There has not been significant interval change  in the appearance of the chest though mild worsening of left lower lobe atelectasis is not excluded. The support tubes and lines are in appropriate position.   Electronically Signed   By: David  [place]   On: 03/13/2014 07:13   Dg Chest Port 1 View  03/12/2014   CLINICAL DATA:  Evaluate endotracheal tube placement.  EXAM: PORTABLE CHEST - 1 VIEW  COMPARISON:  Chest x-ray 03/11/2014.  FINDINGS: An endotracheal tube is in place with tip 3.9 cm above the carina. There is a left-sided internal jugular central venous catheter with tip terminating in the mid superior vena cava. A nasogastric tube is seen extending into the stomach, however, the tip of the nasogastric tube extends below  the lower margin of the image. Lung volumes remain slightly low, but have improved compared to the recent prior examination. Again noted is a background of chronic increased interstitial markings throughout the lungs bilaterally with hyperlucency and architectural distortion in the right upper lobe, similar to multiple prior examinations. No definite acute consolidative airspace disease. No pleural effusions. No evidence of pulmonary edema. Mild cardiomegaly is unchanged. The patient is rotated to the right on today's exam, resulting in distortion of the mediastinal contours and reduced diagnostic sensitivity and specificity for mediastinal pathology.  IMPRESSION: 1. Support apparatus, as above. 2. Chronic changes in the lungs redemonstrated, suggestive of extensive areas of scarring related to prior episodes of infection or inflammation (e.g., ARDS).   Electronically Signed   By: Trudie Reed M.D.   On: 03/12/2014 07:20   Dg Chest Port 1 View  03/11/2014   CLINICAL DATA:  Evaluate endotracheal tube  EXAM: PORTABLE CHEST - 1 VIEW  COMPARISON:  03/10/2014  FINDINGS: Endotracheal tube tip is identified 1.7 cm above the carina. No change left central line. Stable cardiac enlargement exaggerated by limited inspiratory effect. There is  fibrotic change in the right middle lobe. There is stable mild diffuse interstitial change.  There is gaseous distention of the stomach. NG tube is unchanged, again seen passing into the stomach.  IMPRESSION: No significant interval change   Electronically Signed   By: Esperanza Heir M.D.   On: 03/11/2014 07:20   Dg Chest Port 1 View  03/10/2014   CLINICAL DATA:  Hypoxia  EXAM: PORTABLE CHEST - 1 VIEW  COMPARISON:  March 09, 2014  FINDINGS: Endotracheal tube tip is 2.0 cm above the carina. Nasogastric tube tip and side port are in the stomach. Central catheter tip is in the superior vena cava. No pneumothorax. There is left base consolidation. There is scarring in the right mid and lower lung zones. There is also right apical pleural thickening which is stable. Heart is mildly enlarged with pulmonary vascularity within normal limits. No adenopathy.  IMPRESSION: Tube and catheter positions as described without pneumothorax. Persistent left lower lobe consolidation. Areas of scarring on the right appear stable.   Electronically Signed   By: Bretta Bang M.D.   On: 03/10/2014 12:08   Dg Chest Port 1 View  03/09/2014   CLINICAL DATA:  Decreased oxygen saturation  EXAM: PORTABLE CHEST - 1 VIEW  COMPARISON:  03/08/2014  FINDINGS: An endotracheal tube is again identified with the tip 10 mm above the carina. It is stable in appearance. A left central venous line is again noted and stable. Nasogastric catheter is coiled within the stomach. Bilateral nipple shadows are seen. Bilateral infiltrates are noted with improved aeration particularly in the left lung base.  IMPRESSION: Mild infiltrates are again identified bilaterally. Improved aeration is noted.  The endotracheal tube still lies low within the trachea and could be withdrawn 1-2 cm. No new focal abnormality is seen.   Electronically Signed   By: Alcide Clever M.D.   On: 03/09/2014 20:22   Dg Chest Port 1 View  03/08/2014   CLINICAL DATA:  Check  endotracheal tube position  EXAM: PORTABLE CHEST - 1 VIEW  COMPARISON:  03/07/2014  FINDINGS: Endotracheal tube now lies 1 cm above the carina and is relatively stable from the prior exam. The cardiac shadow is stable. A left jugular central line and nasogastric catheter are again seen. Bilateral basilar infiltrates are seen as well as apical changes on the right. The overall  appearance is stable.  IMPRESSION: Stable bilateral infiltrates.  Tubes and lines as described above. Again the endotracheal tube could be withdrawn 1-2 cm.   Electronically Signed   By: Alcide Clever M.D.   On: 03/08/2014 07:36   Dg Chest Port 1 View  03/07/2014   CLINICAL DATA:  Cough.  Intubation.  EXAM: PORTABLE CHEST - 1 VIEW  COMPARISON:  03/07/2014  FINDINGS: Endotracheal tube tip is localized at the carina. Retraction about 2 cm is recommended. Positioning of the tube is unchanged since previous studies. Enteric tube tip is off the field of view but below the left hemidiaphragm. Left central venous catheter is projected over the mid SVC region. Cardiac enlargement with pulmonary vascular congestion and bilateral parenchymal infiltrates. Findings likely due to edema although pneumonia or ARDS could also have this appearance. Bilateral pleural effusions. No significant change since previous study.  IMPRESSION: Cardiac enlargement with pulmonary vascular congestion and diffuse pulmonary infiltration. Bilateral pleural effusions. Appliances are unchanged in position. The endotracheal tube the tip is again noted at the carina and retraction of about 2 cm is again recommended.   Electronically Signed   By: Burman Nieves M.D.   On: 03/07/2014 22:07   Dg Chest Port 1 View  03/07/2014   CLINICAL DATA:  Intubated patient.  EXAM: PORTABLE CHEST - 1 VIEW  COMPARISON:  03/06/2014  FINDINGS: Endotracheal tube tip lies at the chronic. It appears unchanged from the previous exam.  Hazy bilateral airspace lung opacities, most confluent in the  right perihilar and lower lung zone, have increased, some of which is likely due to lower lung volumes on the current exam. There are small bilateral pleural effusions. Scarring and pleural thickening at the apices, greater on the right, is stable. No pneumothorax.  Left internal jugular central venous line tip is well positioned in the lower superior vena cava. Nasogastric tube tip passes below the diaphragm and below the included field of view.  IMPRESSION: 1. Tip of the endotracheal tube projects at the carina. Consider retracting 1-2 cm for more optimal positioning. 2. Lung aeration appears worsened when compared to the previous day's study although the difference may be due to lower lung volumes on the current exam.   Electronically Signed   By: Amie Portland M.D.   On: 03/07/2014 07:32   Dg Chest Port 1 View  03/06/2014   CLINICAL DATA:  Respiratory failure.  EXAM: PORTABLE CHEST - 1 VIEW  COMPARISON:  03/04/2014.  FINDINGS: Endotracheal tube tip is at the level of carina. Recommend retracting by 2-3 cm to avoid mainstem bronchus intubation with change of patients head position.  Nasogastric tube courses below the diaphragm. Tip is not included on the present exam.  Left central line tip projects at the level of the mid to distal superior vena cava. No gross pneumothorax.  Right apical parenchymal changes similar to prior exam.  Interval improved aeration mid to lower lung zones.  Persistent pulmonary vascular congestion/ pulmonary edema.  Retrocardiac opacity may represent atelectasis with pleural fluid. Infiltrate not excluded. Recommend followup until clearance.  Cardiomegaly.  IMPRESSION: Endotracheal tube tip needs to be repositioned as discussed above.  Interval slight improved aeration mid to lower lung zones. Please see above discussion.  These results were called by telephone at the time of interpretation on 03/06/2014 at 8:05 am to [facility] patient's nurse, who verbally acknowledged these results.    Electronically Signed   By: Bridgett Larsson M.D.   On: 03/06/2014 08:08  Dg Chest Port 1 View  03/04/2014   CLINICAL DATA:  ETT placement  EXAM: PORTABLE CHEST - 1 VIEW  COMPARISON:  03/03/2014  FINDINGS: Endotracheal tube terminates 7 mm above the carina.  Multifocal patchy opacities in the lungs bilaterally, worrisome for multifocal pneumonia, less likely moderate interstitial edema. Small bilateral pleural effusions. No pneumothorax.  The heart is top-normal in size.  Left IJ venous catheter terminates at the cavoatrial junction. Enteric tube courses below the diaphragm.  IMPRESSION: Endotracheal tube terminates 7 mm above the carina.  Multifocal patchy opacities in the lungs bilaterally, worrisome for multifocal pneumonia.  Small bilateral pleural effusions.   Electronically Signed   By: Charline Bills M.D.   On: 03/04/2014 07:11   Dg Chest Port 1 View  03/03/2014   CLINICAL DATA:  Endotracheal tube patient, tube placed yesterday, evaluate lungs  EXAM: PORTABLE CHEST - 1 VIEW  COMPARISON:  03/02/2014  FINDINGS: No change in position of endotracheal tube with tip 1 cm above the carina. Left subclavian central line and NG tube unchanged.  Extensive airspace opacity over the right middle and lower lung zones, stable allowing for differences in technique. Diffuse mixed alveolar and interstitial opacity throughout the left lung, most consolidated in the lower lobe, also stable.  IMPRESSION: No change when compared to radiograph performed yesterday.   Electronically Signed   By: Esperanza Heir M.D.   On: 03/03/2014 09:20   Dg Chest Port 1 View  03/02/2014   CLINICAL DATA:  Central line placement  EXAM: PORTABLE CHEST - 1 VIEW  COMPARISON:  03/02/2014  FINDINGS: Endotracheal tube 1 cm above the carina.  Left jugular catheter placement with the tip in the SVC. No pneumothorax. NG tube in the stomach  Diffuse bilateral airspace disease unchanged. Small bilateral effusions unchanged. Question pneumonia versus  heart failure  IMPRESSION: Endotracheal tube 1 cm above the carina  Sent factors central line placement  Diffuse bilateral airspace disease and bilateral effusions unchanged.   Electronically Signed   By: Marlan Palau M.D.   On: 03/02/2014 15:45   Dg Chest Portable 1 View  03/02/2014   CLINICAL DATA:  Cardiac and respiratory arrest  EXAM: PORTABLE CHEST - 1 VIEW  COMPARISON:  Portable chest x-ray of March 02, 2014 at 0952 hr.  FINDINGS: The patient has undergone interval intubation of the trachea. The tip of the tube lies approximately 3 0.6 cm above the crotch of the carina. The esophagogastric tube tip projects off the inferior margin of the image. There are external pacing and defibrillation pannus present. The lungs are adequately inflated but exhibit diffusely increased interstitial markings more conspicuous than earlier today. There is no pneumothorax. A small pleural effusion on the right is suspected but is unchanged.  IMPRESSION: The endotracheal and esophagogastric tubes appear to be in appropriate position. There is diffuse interstitial edema more conspicuous than on the earlier study.   Electronically Signed   By: David  [place]   On: 03/02/2014 11:09   Dg Chest Portable 1 View  03/02/2014   CLINICAL DATA:  Short of breath  EXAM: PORTABLE CHEST - 1 VIEW  COMPARISON:  02/27/2014  FINDINGS: Cardiac enlargement with progression of diffuse bilateral airspace disease, possible pneumonia versus infection. Right pleural effusion slightly larger. No left effusion. Bibasilar atelectasis has progressed.  IMPRESSION: Worsening bilateral airspace disease. This may represent pulmonary edema versus infection.   Electronically Signed   By: Marlan Palau M.D.   On: 03/02/2014 10:16   Dg Abd Portable  1v  03/11/2014   CLINICAL DATA:  Abdominal distention.  EXAM: PORTABLE ABDOMEN - 1 VIEW  COMPARISON:  Abdominal radiograph performed 03/10/2014  FINDINGS: There is diffuse dilatation of small and large bowel  loops throughout the abdomen and pelvis, and of the stomach, compatible with ileus. This is similar in appearance to the prior study. The patient's enteric tube is noted ending at the antrum of the stomach. No free intra-abdominal air is seen, though evaluation for free air is limited on a single supine view.  The visualized osseous structures are within normal limits; the sacroiliac joints are unremarkable in appearance. Mild bibasilar atelectasis is noted.  IMPRESSION: Relatively stable appearance to diffuse ileus. No free intra-abdominal air seen.   Electronically Signed   By: Roanna Raider M.D.   On: 03/11/2014 06:12   Dg Abd Portable 1v  03/10/2014   CLINICAL DATA:  Abdominal distention  EXAM: PORTABLE ABDOMEN - 1 VIEW  COMPARISON:  None.  FINDINGS: There is generalized bowel dilatation. A small amount of air is present in the rectum. No free air is seen on this supine examination. Nasogastric tube tip and side port are in the body of the stomach. There are phleboliths in the pelvis.  IMPRESSION: The bowel gas pattern is most consistent with ileus, although a degree of distal obstruction cannot be entirely excluded. No free air is seen on this supine examination. Nasogastric tube tip and side port in stomach.   Electronically Signed   By: Bretta Bang M.D.   On: 03/10/2014 12:07    ASSESSMENT AND PLAN Cardiomyopathy, with wall motion abnormalities ? Ischemic vs stress cardiomyopathy. Plan: Left heart cath today around 12:30. Interpreter will be available at [facility]. The risks and benefits of a cardiac catheterization including, but not limited to, death, stroke, MI, kidney damage and bleeding were discussed with the patient who indicates understanding and agrees to proceed. Phone interpreter used. Nurse Revonda Standard present with me during phone call.   Signed, Cassell Clement MD

## 2014-03-16 NOTE — Progress Notes (Signed)
Patient arrived back to room at 1535. VSS on arrival. Right wrist access site is clean, dry, and intact. Patient understands not to move right arm is not very compliant, requires frequent reminding.

## 2014-03-16 NOTE — CV Procedure (Signed)
    Cardiac Catheterization Procedure Note  Name: [name] [name] MRN: [id number] DOB: October 24, 1965  Procedure: Left Heart Cath, Selective Coronary Angiography, LV angiography  Indication: Cardiomyopathy of uncertain etiology.    Procedural Details: The right wrist was prepped, draped, and anesthetized with 1% lidocaine. Using the modified Seldinger technique, a 5/6 French Slender sheath was introduced into the right radial artery. 3 mg of verapamil was administered through the sheath, weight-based unfractionated heparin was administered intravenously. Standard Judkins catheters were used for selective coronary angiography and left ventriculography. Catheter exchanges were performed over an exchange length guidewire. There were no immediate procedural complications. A TR band was used for radial hemostasis at the completion of the procedure.  The patient was transferred to the post catheterization recovery area for further monitoring.  Procedural Findings: Hemodynamics: AO 108/68 LV 110/10  Coronary angiography: Coronary dominance: right  Left mainstem: Short, no significant disease.   Left anterior descending (LAD): No angiographic coronary disease.   Left circumflex (LCx): No angiographic coronary disease.   Right coronary artery (RCA): No angiographic coronary disease.   Left ventriculography: Mild MR.  Severe basal to mid inferior hypokinesis.  EF estimated 30%.   Contrast: 75 cc  Final Conclusions:  No angiographic CAD.  Nonischemic cardiomyopathy.   [name] MD, [facility] 03/16/2014, 12:41 PM

## 2014-03-16 NOTE — Progress Notes (Signed)
PT Cancellation Note  Patient Details Name: [name] [name] MRN: [id number] DOB: [birth year]   Cancelled Treatment:    Reason Eval/Treat Not Completed: Patient at procedure or test/unavailable [facility] for cath)   Maida Sale E 03/16/2014, 2:06 PM Zenovia Jarred, PT, DPT 03/16/2014 Pager: [phone]

## 2014-03-16 NOTE — Interval H&P Note (Signed)
History and Physical Interval Note:  03/16/2014 11:49 AM  [name] [name]  has presented today for surgery, with the diagnosis of cp  The various methods of treatment have been discussed with the patient and family. After consideration of risks, benefits and other options for treatment, the patient has consented to  Procedure(s): LEFT HEART CATHETERIZATION WITH CORONARY ANGIOGRAM (N/A) as a surgical intervention .  The patient's history has been reviewed, patient examined, no change in status, stable for surgery.  I have reviewed the patient's chart and labs.  Questions were answered to the patient's satisfaction.     [name] [employer]

## 2014-03-16 NOTE — Interval H&P Note (Signed)
Cath Lab Visit (complete for each Cath Lab visit)  Clinical Evaluation Leading to the Procedure:   ACS: No.  Non-ACS:    Anginal Classification: CCS III  Anti-ischemic medical therapy: Minimal Therapy (1 class of medications)  Non-Invasive Test Results: No non-invasive testing performed  Prior CABG: No previous CABG      History and Physical Interval Note:  03/16/2014 11:52 AM  [name] [name]  has presented today for surgery, with the diagnosis of cp  The various methods of treatment have been discussed with the patient and family. After consideration of risks, benefits and other options for treatment, the patient has consented to  Procedure(s): LEFT HEART CATHETERIZATION WITH CORONARY ANGIOGRAM (N/A) as a surgical intervention .  The patient's history has been reviewed, patient examined, no change in status, stable for surgery.  I have reviewed the patient's chart and labs.  Questions were answered to the patient's satisfaction.     Dalton [employer]

## 2014-03-16 NOTE — Progress Notes (Signed)
Patient ID: [name] [name], female   DOB: 06-07-1966, 48 y.o.   MRN: [id number] [facility] PROGRESS NOTE  Leyton Brownlee WUJ:[id number] DOB: January 12, 1966 DOA: 03/02/2014 PCP: No primary provider on file.  Brief narrative: 48 year old hispanic female (smoker) seen in ED 02/27/14 with shortness of breath, she was treated and subsequently released home. She presented to [facility] ED 03/02/2014 with respiratory distress, had respiratory and cardiac arrest status post 1 round of epi, CPR and subsequently intubated. Initially she was acidotic but has improved with ventilatory support. Patient self extubated 03/07/2014 and required re- ntubation. Finally extubated 03/13/2014. Patient was under PCCM care until and including 03/14/2014 and transferred to [facility] starting 03/15/2014. Hospital course is complicated with seems to be possible ischemic cardiomyopathy. Patient had a 2-D echo on 03/03/2014 which revealed ejection fraction of 30-35% with inferior and posterior/lateral and septal hypokinesis. Plan is for cardiac catheterization today on 03/16/2014.   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 Patient required 1 round of epinephrine, CPR and was intubated. She self extubated 03/07/2014 but has required reintubation. She was finally extubated 03/13/2014. PCCM transferred care to [facility] starting 03/15/2014.  Patient was treated adequately for pneumonia. Blood cultures are negative. Strep pneumonia and Legionella are negative.  Patient did have Quantiferon Gold positive but negative AFB x 3 so airborne precaution stopped per infectious disease recommendations.  Chest x-ray on 03/15/2014 showed no appreciable change compared with prior study 03/14/2014. There is a scarring and pleural  thickening on the right, no frank airspace consolidation and no pneumothorax. Active problems:  Acute encephalopathy  Patient has history of alcohol abuse. No reports of withdrawals so far. Good mental status.  Alcohol level was not obtained at the time of the admission. Leukocytosis  Likely secondary to community-acquired pneumonia for which patient received antibiotics appropriately. Acute decompensated systolic and diastolic CHF / ischemic cardiomyopathy  Patient was diuresed aggressively 9/12 through 9/15 per cardiology recommendations , had -8.8 L fluid balance.. Cardiac cath today.  2-D echo done 03/03/2014 showed ejection fraction 30-35%.  Continue Coreg 3.125 mg twice daily and aspirin. Colonic ileus  Improved with NG suction. Patient does not have NG tube at this time. Abdomen is not distended. She is NPO prior to cath. Diet will be resumed after procedure.  Acute renal failure  Likely secondary to cardiac and respiratory distress, Lasix.  Creatinine is now within normal limits. DVT Prophylaxis  Heparin subcutaneous  Code Status: Full.  Family Communication: plan of care discussed with the patient  Disposition Plan: Home when stable.   IV Access:   Peripheral IV  Procedures and diagnostic studies:   Dg Chest Port 1 View 03/15/2014 No appreciable change from 1 day prior. Scarring and pleural thickening on the right. Mild left base atelectasis. No frank airspace consolidation. Heart prominent but stable. No pneumothorax.  Dg Chest Port 1 View 03/14/2014 Scarring on the right. Left base atelectatic change. Mild cardiac enlargement. No pneumothorax.  CT-PA 03/02/14 No large PE (exam limited), B LAD, B LL predominant infiltrates and consolidation, small B effusions  TTE 03/03/14 Regional LV hypokinesis and dilation, EF 30-35%, dilated LA,  03/10/14 CT head Negative  03/11/14 CT abdomen Consistent with colonic ileus, bilateral effusions atelectasis  Left heart catheterization  03/16/2014  Medical Consultants:   Cardiology   Other Consultants:  None   Anti-Infectives:   None    Manson Passey, MD  [facility] Pager [phone]  If 7PM-7AM, please contact night-coverage [email or web address] Password [facility] 03/16/2014, 8:49 AM   LOS: 14 days    HPI/Subjective: No acute overnight events.  Objective: Filed Vitals:   03/15/14 2000 03/16/14 0000 03/16/14 0400 03/16/14 0735  BP: 120/86 126/76 125/74   Pulse: 105 104 102   Temp:  99.8 F (37.7 C) 98.4 F (36.9 C) 98.4 F (36.9 C)  TempSrc:   Oral Oral  Resp: 43 52 42   Height:      Weight:   56.1 kg (123 lb 10.9 oz)   SpO2: 98% 95% 96%     Intake/Output Summary (Last 24 hours) at 03/16/14 0849 Last data filed at 03/16/14 0400  Gross per 24 hour  Intake    910 ml  Output    650 ml  Net    260 ml    Exam:   General:  Pt is alert, no distress  Cardiovascular: tachycardic, S1/S2 appreciated   Respiratory: bilateral air entry, no wheezing   Abdomen: Soft, non tender, non distended, bowel sounds present  Extremities: No edema, pulses DP and PT palpable bilaterally  Neuro: No focal neurological deficits  Data Reviewed: Basic Metabolic Panel:  Recent Labs Lab 03/11/14 0615 03/12/14 0450 03/13/14 0600 03/14/14 0530 03/15/14 0530  NA 139 137 139 137 134*  K 4.7 3.9 3.7 4.3 4.0  CL 99 94* 93* 95* 92*  CO2 27 29 28 24 23   GLUCOSE 144* 85 87 92 108*  BUN 26* 29* 28* 28* 26*  CREATININE 0.79 0.97 1.12* 0.94 0.90  CALCIUM 9.9 9.8 10.2 10.1 10.3  MG 2.5 2.4 2.6* 2.4 2.3  PHOS 3.4 4.0 5.3* 4.6 4.0   Liver Function Tests:  Recent Labs Lab 03/10/14 0400 03/11/14 0615  AST 38* 27  ALT 55* 46*  ALKPHOS 86 89  BILITOT 0.7 0.7  PROT 7.7 8.2  ALBUMIN 2.9* 3.0*   No results found for this basename: LIPASE, AMYLASE,  in the last 168 hours No results found for this basename: AMMONIA,  in the last 168 hours CBC:  Recent Labs Lab 03/11/14 0615 03/12/14 0450 03/13/14 0600  03/14/14 0530 03/15/14 0530  WBC 17.8* 17.1* 16.5* 16.5* 16.2*  NEUTROABS  --  13.3* 12.7*  --   --   HGB 10.0* 9.4* 10.2* 10.2* 10.8*  HCT 31.2* 28.1* 30.9* 30.9* 32.7*  MCV 93.4 89.5 89.8 89.6 91.1  PLT 503* 513* 588* 648* 637*   Cardiac Enzymes: No results found for this basename: CKTOTAL, CKMB, CKMBINDEX, TROPONINI,  in the last 168 hours BNP: No components found with this basename: POCBNP,  CBG:  Recent Labs Lab 03/15/14 0759 03/15/14 1224 03/15/14 1603 03/15/14 2121 03/16/14 0758  GLUCAP 102* 107* 121* 116* 118*    No results found for this or any previous visit (from the past 240 hour(s)).   Scheduled Meds: . aspirin  81 mg Oral Pre-Cath  . aspirin EC  81 mg Oral Daily  . carvedilol  3.125 mg Oral BID WC  . heparin  5,000 Units Subcutaneous 3 times per day  . insulin aspart  0-15 Units Subcutaneous TID WC  . pantoprazole (PROTONIX) IV  40 mg Intravenous Daily   Continuous Infusions: . sodium chloride 10 mL/hr at 03/14/14 1900

## 2014-03-16 NOTE — Progress Notes (Signed)
Conversed with patient via telephone interpreter. Patient understands why and when her heart cath will take place today. Patient with no complaints of pain or signs of distress. VSS. Patient departed for [facility] via [facility] at 1115.

## 2014-03-16 NOTE — Progress Notes (Signed)
Pt transported to [facility] via [employer]

## 2014-03-16 NOTE — Progress Notes (Signed)
 Patient Name: [name] [name] Date of Encounter: 03/16/2014     Active Problems:   Respiratory failure   CAP (community acquired pneumonia)   ARDS (adult respiratory distress syndrome)   Encephalopathy acute   Acute respiratory failure with hypoxia    SUBJECTIVE  Patient has done well overnight.  No chest pain. We used telephone interpreter this am to explain cath and risks to her.  CURRENT MEDS . antiseptic oral rinse  7 mL Mouth Rinse QID  . aspirin  81 mg Oral Pre-Cath  . aspirin EC  81 mg Oral Daily  . carvedilol  3.125 mg Oral BID WC  . chlorhexidine  15 mL Mouth Rinse BID  . heparin  5,000 Units Subcutaneous 3 times per day  . insulin aspart  0-15 Units Subcutaneous TID WC  . pantoprazole (PROTONIX) IV  40 mg Intravenous Daily  . sodium chloride  3 mL Intravenous Q12H    OBJECTIVE  Filed Vitals:   03/15/14 1925 03/15/14 2000 03/16/14 0000 03/16/14 0400  BP:  120/86 126/76 125/74  Pulse:  105 104 102  Temp: 98.8 F (37.1 C)  99.8 F (37.7 C) 98.4 F (36.9 C)  TempSrc: Oral   Oral  Resp:  43 52 42  Height:      Weight:    123 lb 10.9 oz (56.1 kg)  SpO2:  98% 95% 96%    Intake/Output Summary (Last 24 hours) at 03/16/14 0736 Last data filed at 03/16/14 0400  Gross per 24 hour  Intake    920 ml  Output    650 ml  Net    270 ml   Filed Weights   03/14/14 0423 03/15/14 0427 03/16/14 0400  Weight: 123 lb 3.8 oz (55.9 kg) 125 lb 10.6 oz (57 kg) 123 lb 10.9 oz (56.1 kg)    PHYSICAL EXAM  General: Pleasant, NAD. Neuro: Alert and oriented X 3. Moves all extremities spontaneously. Psych: Normal affect. HEENT:  Normal  Neck: Supple without bruits or JVD. Lungs:  Resp regular and unlabored, CTA. Heart: RRR no s3, s4, or murmurs. Abdomen: Soft, non-tender, non-distended, BS + x 4.  Extremities: No clubbing, cyanosis or edema. DP/PT/Radials 2+ and equal bilaterally.  Accessory Clinical Findings  CBC  Recent Labs  03/14/14 0530 03/15/14 0530  WBC  16.5* 16.2*  HGB 10.2* 10.8*  HCT 30.9* 32.7*  MCV 89.6 91.1  PLT 648* 637*   Basic Metabolic Panel  Recent Labs  03/14/14 0530 03/15/14 0530  NA 137 134*  K 4.3 4.0  CL 95* 92*  CO2 24 23  GLUCOSE 92 108*  BUN 28* 26*  CREATININE 0.94 0.90  CALCIUM 10.1 10.3  MG 2.4 2.3  PHOS 4.6 4.0   Liver Function Tests No results found for this basename: AST, ALT, ALKPHOS, BILITOT, PROT, ALBUMIN,  in the last 72 hours No results found for this basename: LIPASE, AMYLASE,  in the last 72 hours Cardiac Enzymes No results found for this basename: CKTOTAL, CKMB, CKMBINDEX, TROPONINI,  in the last 72 hours BNP No components found with this basename: POCBNP,  D-Dimer No results found for this basename: DDIMER,  in the last 72 hours Hemoglobin A1C No results found for this basename: HGBA1C,  in the last 72 hours Fasting Lipid Panel No results found for this basename: CHOL, HDL, LDLCALC, TRIG, CHOLHDL, LDLDIRECT,  in the last 72 hours Thyroid Function Tests No results found for this basename: TSH, T4TOTAL, FREET3, T3FREE, THYROIDAB,  in the last 72 hours    TELE  Sinus tachycardia  ECG    Radiology/Studies  Ct Abdomen Pelvis Wo Contrast  03/11/2014   CLINICAL DATA:  Patient on ventilator, abdominal distention  EXAM: CT ABDOMEN AND PELVIS WITHOUT CONTRAST  TECHNIQUE: Multidetector CT imaging of the abdomen and pelvis was performed following the standard protocol without IV contrast.  COMPARISON:  Abdominal radiograph 03/11/2014  FINDINGS: Oral contrast was given by NG tube. Oral contrast is seen within the stomach. Oral contrast has also progressed into small bowel and to a lesser degree through the colon with a small volume of oral contrast into the rectum. Rectum is distended and fluid-filled, with a diameter of 7.2 cm. There are air-fluid levels throughout the colon. Transverse colon is distended 2 6.7 cm. Cecum measures 7.6 cm. Small bowel is relatively decompressed. There is no  evidence of free air.  Bladder is decompressed by Foley catheter. There are no abnormalities involving reproductive organs. There is trace free fluid in the pelvis. Aorta is nondilated.  Given limited evaluation without IV contrast, liver and spleen are normal. Gallbladder appears normal. Pancreas is normal. Adrenal glands and kidneys are normal. NG tube extends into the stomach with tip in the region of the antrum.  There are no acute musculoskeletal findings. There is interstitial prominence in the visualized portions of the lung bases suggesting pulmonary edema. There is cardiac enlargement. There is a small right in a moderate left pleural effusion. Bilateral lower lobe consolidation dependently is likely related to atelectasis.  IMPRESSION: 1. Findings most consistent with colonic ileus 2. Findings consistent with pulmonary edema, pleural effusions, and bilateral dependent atelectasis.   Electronically Signed   By: Raymond  Rubner M.D.   On: 03/11/2014 10:19   Dg Chest 2 View  02/27/2014   CLINICAL DATA:  Mid back pain  EXAM: CHEST  2 VIEW  COMPARISON:  None.  FINDINGS: The lungs are markedly abnormal bilaterally. Within the right lung, there is volume loss. Heterogeneous opacities are scattered throughout the lung with relative lucency in the upper lobe. This may represent cavitation. There is pleural thickening at the right apex. There is more confluent opacity in the posterior upper lobe, likely the right. Underlying mass at the right apex is not excluded.  Date heterogeneous opacities are present at the left base. There is irregular opacity extending into the left apex as well. These densities are predominantly linear.  IMPRESSION: Abnormal lungs as described. An inflammatory process or malignancy are not excluded. Comparison with prior studies would be helpful. If none are available, CT chest is recommended to exclude mass.   Electronically Signed   By: Art  Hoss M.D.   On: 02/27/2014 12:08   Ct Head  Wo Contrast  03/10/2014   CLINICAL DATA:  Encephalopathy, altered mental status.  EXAM: CT HEAD WITHOUT CONTRAST  TECHNIQUE: Contiguous axial images were obtained from the base of the skull through the vertex without intravenous contrast.  COMPARISON:  None.  FINDINGS: The ventricles and sulci are normal. No intraparenchymal hemorrhage, mass effect nor midline shift. No acute large vascular territory infarcts.  No abnormal extra-axial fluid collections. Basal cisterns are patent.  No skull fracture. The included ocular globes and orbital contents are non-suspicious. The mastoid aircells and included paranasal sinuses are well-aerated. Life support lines seen on the scout view.  IMPRESSION: No acute intracranial process.  Normal noncontrast CT of the head.   Electronically Signed   By: Courtnay  Bloomer   On: 03/10/2014 01:01   Ct Chest W Contrast    02/27/2014   CLINICAL DATA:  Shortness of breath. Chest and back pain. Right apical opacity on chest radiograph.  EXAM: CT CHEST WITH CONTRAST  TECHNIQUE: Multidetector CT imaging of the chest was performed during intravenous contrast administration.  CONTRAST:  80mL OMNIPAQUE IOHEXOL 300 MG/ML  SOLN  COMPARISON:  None.  FINDINGS: Mediastinum/Hilar Regions: Mild soft tissue density seen in the right paratracheal region measuring 1.3 cm on image 13. Mild subcarinal lymphadenopathy also seen measuring 1.3 cm. No evidence of lymph node calcification.  Other Thoracic Lymphadenopathy:  None.  Lungs: Asymmetric pleural-parenchymal opacity in the right lung apex shows associated peripheral bronchiectasis, consistent with scarring. Other areas of scarring are also seen bilaterally involving the right lung greater than left.  Diffuse thickening of pulmonary interlobular septi also demonstrated with bibasilar predominance, and mild interstitial edema cannot be excluded. No evidence of pulmonary consolidation or discrete pulmonary mass. No evidence of central endobronchial  obstruction.  Pleura: Tiny bilateral pleural effusions noted, left side greater than right.  Vascular/Cardiac: No thoracic aortic aneurysm or other significant abnormality identified.  Musculoskeletal:  No suspicious bone lesions identified.  Other:  None.  IMPRESSION: Tiny bilateral pleural effusions and thickening of pulmonary interlobular septi in both lung bases. Although this could be due to chronic interstitial lung disease, mild edema or interstitial pneumonitis cannot be excluded. Continued chest radiographic followup is recommended.  Bilateral pleural- parenchymal scarring with associated traction bronchiectasis in right lung apex.  Mild mediastinal lymphadenopathy in the right paratracheal and subcarinal regions, which is nonspecific and may be reactive in etiology. Continued followup by CT is recommended in 3-6 months.   Electronically Signed   By: John  Stahl M.D.   On: 02/27/2014 14:31   Ct Angio Chest Pe W/cm &/or Wo Cm  03/02/2014   CLINICAL DATA:  Respiratory and cardiac arrest. Evaluate for pulmonary embolism  EXAM: CT ANGIOGRAPHY CHEST WITH CONTRAST  TECHNIQUE: Multidetector CT imaging of the chest was performed using the standard protocol during bolus administration of intravenous contrast. Multiplanar CT image reconstructions and MIPs were obtained to evaluate the vascular anatomy.  CONTRAST:  100mL OMNIPAQUE IOHEXOL 350 MG/ML SOLN  COMPARISON:  02/27/2014  FINDINGS: THORACIC INLET/BODY WALL:  Interval tracheal and esophageal intubation. The nasogastric tube continues into the stomach at least. Endotracheal tube ends 1 cm above the carina, deeper than on preceding chest x-ray.  There is symmetric ill-defined fluid within the visceral neck compartment, without definitive cause. This could be reactive to recent intubation or from a generalized edematous state. No fluid collection or subcutaneous emphysema.  MEDIASTINUM:  Cardiomegaly, mainly related to left ventricular enlargement. No pericardial  effusion. No significant atherosclerotic change. No acute aortic findings.  Evaluation of the pulmonary arterial tree is limited at multiple levels due to respiratory motion. In general the exam is diagnostic to the level of the proximal most segmental pulmonary arteries. No evidence of pulmonary embolism.  Enlargement of subcarinal and hilar lymph nodes which is symmetric and presumably reactive. Mediastinal node followup has been previously recommended.  LUNG WINDOWS:  Diffuse lung consolidation. There is a dependent predilection, although the consolidation is panlobar. No definitive debris within the central airways. The right upper lobe bronchi are distorted and narrowed, with chronic hyperlucency of the right upper lobe consistent with air trapping. The vessels in this region are likewise attenuated. Given posterior segment calcifications bilaterally, this is likely postinfectious scarring - possibly remote TB. Small bilateral pleural effusions without loculation or abnormal pleural enhancement.  UPPER ABDOMEN:  No   acute findings.  OSSEOUS:  No acute findings.  Review of the MIP images confirms the above findings.  IMPRESSION: 1. Multi lobar pneumonia with small pleural effusions. The pattern and history of cardiac arrest implicates aspiration. 2. As permitted by moderate motion degradation, no evidence of pulmonary embolism. 3. Endotracheal tube lower than at time of placement, now ending 1 cm above the carina. 4. Left ventricular enlargement.  No definite pulmonary edema. 5. Right upper lobe scarring with air trapping, likely from remote infection.   Electronically Signed   By: Jonathan  Watts M.D.   On: 03/02/2014 13:12   Dg Chest Port 1 View  03/15/2014   CLINICAL DATA:  Pneumonia  EXAM: PORTABLE CHEST - 1 VIEW  COMPARISON:  March 14, 2014  FINDINGS: Central catheter tip is in the superior vena cava. No pneumothorax. Areas of scarring and apical pleural thickening on the right remain stable. There is  mild atelectatic change in the left base, stable. There is no new opacity. There is no frank consolidation. Heart is mildly enlarged with pulmonary vascularity within normal limits, stable. No appreciable adenopathy.  IMPRESSION: No appreciable change from 1 day prior. Scarring and pleural thickening on the right. Mild left base atelectasis. No frank airspace consolidation. Heart prominent but stable. No pneumothorax.   Electronically Signed   By: William  Woodruff M.D.   On: 03/15/2014 08:04   Dg Chest Port 1 View  03/14/2014   CLINICAL DATA:  Atelectasis  EXAM: PORTABLE CHEST - 1 VIEW  COMPARISON:  March 13, 2014  FINDINGS: Endotracheal and nasogastric tube have been removed. No pneumothorax. Central catheter tip is in the superior vena cava. No pneumothorax. There is asymmetric apical pleural thickening on the right with scattered scarring in the right lung, stable. There is atelectatic change in the left base. There is no appreciable consolidation.  Heart is mildly enlarged with pulmonary vascularity within normal limits. No adenopathy.  IMPRESSION: Scarring on the right. Left base atelectatic change. Mild cardiac enlargement. No pneumothorax.   Electronically Signed   By: William  Woodruff M.D.   On: 03/14/2014 07:08   Dg Chest Port 1 View  03/13/2014   CLINICAL DATA:  Assess endotracheal tube position.  EXAM: PORTABLE CHEST - 1 VIEW  COMPARISON:  Portable chest x-ray of March 12, 2014  FINDINGS: The lungs are reasonably well inflated. The retrocardiac density on the left is slightly increased. The left hemidiaphragm remains partially obscured. The cardiac silhouette is normal. The pulmonary vascularity is not engorged.  The endotracheal tube tip lies 3.1 cm above the crotch of the carina. The left internal jugular venous catheter tip lies in the proximal SVC. The esophagogastric tube tip projects below the inferior margin of the image.  IMPRESSION: There has not been significant interval change  in the appearance of the chest though mild worsening of left lower lobe atelectasis is not excluded. The support tubes and lines are in appropriate position.   Electronically Signed   By: David  Jordan   On: 03/13/2014 07:13   Dg Chest Port 1 View  03/12/2014   CLINICAL DATA:  Evaluate endotracheal tube placement.  EXAM: PORTABLE CHEST - 1 VIEW  COMPARISON:  Chest x-ray 03/11/2014.  FINDINGS: An endotracheal tube is in place with tip 3.9 cm above the carina. There is a left-sided internal jugular central venous catheter with tip terminating in the mid superior vena cava. A nasogastric tube is seen extending into the stomach, however, the tip of the nasogastric tube extends below   the lower margin of the image. Lung volumes remain slightly low, but have improved compared to the recent prior examination. Again noted is a background of chronic increased interstitial markings throughout the lungs bilaterally with hyperlucency and architectural distortion in the right upper lobe, similar to multiple prior examinations. No definite acute consolidative airspace disease. No pleural effusions. No evidence of pulmonary edema. Mild cardiomegaly is unchanged. The patient is rotated to the right on today's exam, resulting in distortion of the mediastinal contours and reduced diagnostic sensitivity and specificity for mediastinal pathology.  IMPRESSION: 1. Support apparatus, as above. 2. Chronic changes in the lungs redemonstrated, suggestive of extensive areas of scarring related to prior episodes of infection or inflammation (e.g., ARDS).   Electronically Signed   By: Daniel  Entrikin M.D.   On: 03/12/2014 07:20   Dg Chest Port 1 View  03/11/2014   CLINICAL DATA:  Evaluate endotracheal tube  EXAM: PORTABLE CHEST - 1 VIEW  COMPARISON:  03/10/2014  FINDINGS: Endotracheal tube tip is identified 1.7 cm above the carina. No change left central line. Stable cardiac enlargement exaggerated by limited inspiratory effect. There is  fibrotic change in the right middle lobe. There is stable mild diffuse interstitial change.  There is gaseous distention of the stomach. NG tube is unchanged, again seen passing into the stomach.  IMPRESSION: No significant interval change   Electronically Signed   By: Raymond  Rubner M.D.   On: 03/11/2014 07:20   Dg Chest Port 1 View  03/10/2014   CLINICAL DATA:  Hypoxia  EXAM: PORTABLE CHEST - 1 VIEW  COMPARISON:  March 09, 2014  FINDINGS: Endotracheal tube tip is 2.0 cm above the carina. Nasogastric tube tip and side port are in the stomach. Central catheter tip is in the superior vena cava. No pneumothorax. There is left base consolidation. There is scarring in the right mid and lower lung zones. There is also right apical pleural thickening which is stable. Heart is mildly enlarged with pulmonary vascularity within normal limits. No adenopathy.  IMPRESSION: Tube and catheter positions as described without pneumothorax. Persistent left lower lobe consolidation. Areas of scarring on the right appear stable.   Electronically Signed   By: William  Woodruff M.D.   On: 03/10/2014 12:08   Dg Chest Port 1 View  03/09/2014   CLINICAL DATA:  Decreased oxygen saturation  EXAM: PORTABLE CHEST - 1 VIEW  COMPARISON:  03/08/2014  FINDINGS: An endotracheal tube is again identified with the tip 10 mm above the carina. It is stable in appearance. A left central venous line is again noted and stable. Nasogastric catheter is coiled within the stomach. Bilateral nipple shadows are seen. Bilateral infiltrates are noted with improved aeration particularly in the left lung base.  IMPRESSION: Mild infiltrates are again identified bilaterally. Improved aeration is noted.  The endotracheal tube still lies low within the trachea and could be withdrawn 1-2 cm. No new focal abnormality is seen.   Electronically Signed   By: Mark  Lukens M.D.   On: 03/09/2014 20:22   Dg Chest Port 1 View  03/08/2014   CLINICAL DATA:  Check  endotracheal tube position  EXAM: PORTABLE CHEST - 1 VIEW  COMPARISON:  03/07/2014  FINDINGS: Endotracheal tube now lies 1 cm above the carina and is relatively stable from the prior exam. The cardiac shadow is stable. A left jugular central line and nasogastric catheter are again seen. Bilateral basilar infiltrates are seen as well as apical changes on the right. The overall   appearance is stable.  IMPRESSION: Stable bilateral infiltrates.  Tubes and lines as described above. Again the endotracheal tube could be withdrawn 1-2 cm.   Electronically Signed   By: Mark  Lukens M.D.   On: 03/08/2014 07:36   Dg Chest Port 1 View  03/07/2014   CLINICAL DATA:  Cough.  Intubation.  EXAM: PORTABLE CHEST - 1 VIEW  COMPARISON:  03/07/2014  FINDINGS: Endotracheal tube tip is localized at the carina. Retraction about 2 cm is recommended. Positioning of the tube is unchanged since previous studies. Enteric tube tip is off the field of view but below the left hemidiaphragm. Left central venous catheter is projected over the mid SVC region. Cardiac enlargement with pulmonary vascular congestion and bilateral parenchymal infiltrates. Findings likely due to edema although pneumonia or ARDS could also have this appearance. Bilateral pleural effusions. No significant change since previous study.  IMPRESSION: Cardiac enlargement with pulmonary vascular congestion and diffuse pulmonary infiltration. Bilateral pleural effusions. Appliances are unchanged in position. The endotracheal tube the tip is again noted at the carina and retraction of about 2 cm is again recommended.   Electronically Signed   By: William  Stevens M.D.   On: 03/07/2014 22:07   Dg Chest Port 1 View  03/07/2014   CLINICAL DATA:  Intubated patient.  EXAM: PORTABLE CHEST - 1 VIEW  COMPARISON:  03/06/2014  FINDINGS: Endotracheal tube tip lies at the chronic. It appears unchanged from the previous exam.  Hazy bilateral airspace lung opacities, most confluent in the  right perihilar and lower lung zone, have increased, some of which is likely due to lower lung volumes on the current exam. There are small bilateral pleural effusions. Scarring and pleural thickening at the apices, greater on the right, is stable. No pneumothorax.  Left internal jugular central venous line tip is well positioned in the lower superior vena cava. Nasogastric tube tip passes below the diaphragm and below the included field of view.  IMPRESSION: 1. Tip of the endotracheal tube projects at the carina. Consider retracting 1-2 cm for more optimal positioning. 2. Lung aeration appears worsened when compared to the previous day's study although the difference may be due to lower lung volumes on the current exam.   Electronically Signed   By: David  Ormond M.D.   On: 03/07/2014 07:32   Dg Chest Port 1 View  03/06/2014   CLINICAL DATA:  Respiratory failure.  EXAM: PORTABLE CHEST - 1 VIEW  COMPARISON:  03/04/2014.  FINDINGS: Endotracheal tube tip is at the level of carina. Recommend retracting by 2-3 cm to avoid mainstem bronchus intubation with change of patients head position.  Nasogastric tube courses below the diaphragm. Tip is not included on the present exam.  Left central line tip projects at the level of the mid to distal superior vena cava. No gross pneumothorax.  Right apical parenchymal changes similar to prior exam.  Interval improved aeration mid to lower lung zones.  Persistent pulmonary vascular congestion/ pulmonary edema.  Retrocardiac opacity may represent atelectasis with pleural fluid. Infiltrate not excluded. Recommend followup until clearance.  Cardiomegaly.  IMPRESSION: Endotracheal tube tip needs to be repositioned as discussed above.  Interval slight improved aeration mid to lower lung zones. Please see above discussion.  These results were called by telephone at the time of interpretation on 03/06/2014 at 8:05 am to Sara patient's nurse, who verbally acknowledged these results.    Electronically Signed   By: Steve  Olson M.D.   On: 03/06/2014 08:08     Dg Chest Port 1 View  03/04/2014   CLINICAL DATA:  ETT placement  EXAM: PORTABLE CHEST - 1 VIEW  COMPARISON:  03/03/2014  FINDINGS: Endotracheal tube terminates 7 mm above the carina.  Multifocal patchy opacities in the lungs bilaterally, worrisome for multifocal pneumonia, less likely moderate interstitial edema. Small bilateral pleural effusions. No pneumothorax.  The heart is top-normal in size.  Left IJ venous catheter terminates at the cavoatrial junction. Enteric tube courses below the diaphragm.  IMPRESSION: Endotracheal tube terminates 7 mm above the carina.  Multifocal patchy opacities in the lungs bilaterally, worrisome for multifocal pneumonia.  Small bilateral pleural effusions.   Electronically Signed   By: Sriyesh  Krishnan M.D.   On: 03/04/2014 07:11   Dg Chest Port 1 View  03/03/2014   CLINICAL DATA:  Endotracheal tube patient, tube placed yesterday, evaluate lungs  EXAM: PORTABLE CHEST - 1 VIEW  COMPARISON:  03/02/2014  FINDINGS: No change in position of endotracheal tube with tip 1 cm above the carina. Left subclavian central line and NG tube unchanged.  Extensive airspace opacity over the right middle and lower lung zones, stable allowing for differences in technique. Diffuse mixed alveolar and interstitial opacity throughout the left lung, most consolidated in the lower lobe, also stable.  IMPRESSION: No change when compared to radiograph performed yesterday.   Electronically Signed   By: Raymond  Rubner M.D.   On: 03/03/2014 09:20   Dg Chest Port 1 View  03/02/2014   CLINICAL DATA:  Central line placement  EXAM: PORTABLE CHEST - 1 VIEW  COMPARISON:  03/02/2014  FINDINGS: Endotracheal tube 1 cm above the carina.  Left jugular catheter placement with the tip in the SVC. No pneumothorax. NG tube in the stomach  Diffuse bilateral airspace disease unchanged. Small bilateral effusions unchanged. Question pneumonia versus  heart failure  IMPRESSION: Endotracheal tube 1 cm above the carina  Sent factors central line placement  Diffuse bilateral airspace disease and bilateral effusions unchanged.   Electronically Signed   By: Charles  Clark M.D.   On: 03/02/2014 15:45   Dg Chest Portable 1 View  03/02/2014   CLINICAL DATA:  Cardiac and respiratory arrest  EXAM: PORTABLE CHEST - 1 VIEW  COMPARISON:  Portable chest x-ray of March 02, 2014 at 0952 hr.  FINDINGS: The patient has undergone interval intubation of the trachea. The tip of the tube lies approximately 3 0.6 cm above the crotch of the carina. The esophagogastric tube tip projects off the inferior margin of the image. There are external pacing and defibrillation pannus present. The lungs are adequately inflated but exhibit diffusely increased interstitial markings more conspicuous than earlier today. There is no pneumothorax. A small pleural effusion on the right is suspected but is unchanged.  IMPRESSION: The endotracheal and esophagogastric tubes appear to be in appropriate position. There is diffuse interstitial edema more conspicuous than on the earlier study.   Electronically Signed   By: David  Jordan   On: 03/02/2014 11:09   Dg Chest Portable 1 View  03/02/2014   CLINICAL DATA:  Short of breath  EXAM: PORTABLE CHEST - 1 VIEW  COMPARISON:  02/27/2014  FINDINGS: Cardiac enlargement with progression of diffuse bilateral airspace disease, possible pneumonia versus infection. Right pleural effusion slightly larger. No left effusion. Bibasilar atelectasis has progressed.  IMPRESSION: Worsening bilateral airspace disease. This may represent pulmonary edema versus infection.   Electronically Signed   By: Charles  Clark M.D.   On: 03/02/2014 10:16   Dg Abd Portable   1v  03/11/2014   CLINICAL DATA:  Abdominal distention.  EXAM: PORTABLE ABDOMEN - 1 VIEW  COMPARISON:  Abdominal radiograph performed 03/10/2014  FINDINGS: There is diffuse dilatation of small and large bowel  loops throughout the abdomen and pelvis, and of the stomach, compatible with ileus. This is similar in appearance to the prior study. The patient's enteric tube is noted ending at the antrum of the stomach. No free intra-abdominal air is seen, though evaluation for free air is limited on a single supine view.  The visualized osseous structures are within normal limits; the sacroiliac joints are unremarkable in appearance. Mild bibasilar atelectasis is noted.  IMPRESSION: Relatively stable appearance to diffuse ileus. No free intra-abdominal air seen.   Electronically Signed   By: Jeffery  Chang M.D.   On: 03/11/2014 06:12   Dg Abd Portable 1v  03/10/2014   CLINICAL DATA:  Abdominal distention  EXAM: PORTABLE ABDOMEN - 1 VIEW  COMPARISON:  None.  FINDINGS: There is generalized bowel dilatation. A small amount of air is present in the rectum. No free air is seen on this supine examination. Nasogastric tube tip and side port are in the body of the stomach. There are phleboliths in the pelvis.  IMPRESSION: The bowel gas pattern is most consistent with ileus, although a degree of distal obstruction cannot be entirely excluded. No free air is seen on this supine examination. Nasogastric tube tip and side port in stomach.   Electronically Signed   By: William  Woodruff M.D.   On: 03/10/2014 12:07    ASSESSMENT AND PLAN Cardiomyopathy, with wall motion abnormalities ? Ischemic vs stress cardiomyopathy. Plan: Left heart cath today around 12:30. Interpreter will be available at [facility]. The risks and benefits of a cardiac catheterization including, but not limited to, death, stroke, MI, kidney damage and bleeding were discussed with the patient who indicates understanding and agrees to proceed. Phone interpreter used. Nurse Allison present with me during phone call.   Signed, Marice Guidone MD  

## 2014-03-17 DIAGNOSIS — I428 Other cardiomyopathies: Secondary | ICD-10-CM

## 2014-03-17 LAB — BASIC METABOLIC PANEL
Anion gap: 14 (ref 5–15)
BUN: 18 mg/dL (ref 6–23)
CHLORIDE: 97 meq/L (ref 96–112)
CO2: 22 mEq/L (ref 19–32)
CREATININE: 0.85 mg/dL (ref 0.50–1.10)
Calcium: 9.2 mg/dL (ref 8.4–10.5)
GFR calc Af Amer: >90 mL/min (ref >90)
GFR, EST NON AFRICAN AMERICAN: 81 mL/min — AB (ref >90)
Glucose, Bld: 85 mg/dL (ref 70–99)
Potassium: 3.5 mEq/L — ABNORMAL LOW (ref 3.7–5.3)
Sodium: 133 mEq/L — ABNORMAL LOW (ref 137–147)

## 2014-03-17 LAB — GLUCOSE, CAPILLARY
GLUCOSE-CAPILLARY: 143 mg/dL — AB (ref 70–99)
Glucose-Capillary: 112 mg/dL — ABNORMAL HIGH (ref 70–99)
Glucose-Capillary: 83 mg/dL (ref 70–99)
Glucose-Capillary: 91 mg/dL (ref 70–99)

## 2014-03-17 LAB — CBC
HCT: 30 % — ABNORMAL LOW (ref 36.0–46.0)
Hemoglobin: 10 g/dL — ABNORMAL LOW (ref 12.0–15.0)
MCH: 29.5 pg (ref 26.0–34.0)
MCHC: 33.3 g/dL (ref 30.0–36.0)
MCV: 88.5 fL (ref 78.0–100.0)
PLATELETS: 623 10*3/uL — AB (ref 150–400)
RBC: 3.39 MIL/uL — ABNORMAL LOW (ref 3.87–5.11)
RDW: 13.3 % (ref 11.5–15.5)
WBC: 13.8 10*3/uL — AB (ref 4.0–10.5)

## 2014-03-17 MED ORDER — POTASSIUM CHLORIDE CRYS ER 20 MEQ PO TBCR
40.0000 meq | EXTENDED_RELEASE_TABLET | Freq: Once | ORAL | Status: AC
Start: 2014-03-17 — End: 2014-03-17
  Administered 2014-03-17: 40 meq via ORAL
  Filled 2014-03-17: qty 2

## 2014-03-17 MED ORDER — CARVEDILOL 3.125 MG PO TABS
3.1250 mg | ORAL_TABLET | Freq: Once | ORAL | Status: AC
Start: 2014-03-17 — End: 2014-03-17
  Administered 2014-03-17: 3.125 mg via ORAL
  Filled 2014-03-17: qty 1

## 2014-03-17 MED ORDER — CARVEDILOL 6.25 MG PO TABS
6.2500 mg | ORAL_TABLET | Freq: Two times a day (BID) | ORAL | Status: DC
  Administered 2014-03-17 – 2014-03-19 (×3): 6.25 mg via ORAL
  Filled 2014-03-17 (×6): qty 1

## 2014-03-17 MED ORDER — HYDROMORPHONE HCL 1 MG/ML IJ SOLN: 0.5000 mg | INTRAMUSCULAR | Status: DC | PRN

## 2014-03-17 MED ORDER — PANTOPRAZOLE SODIUM 40 MG PO TBEC
40.0000 mg | DELAYED_RELEASE_TABLET | Freq: Every day | ORAL | Status: DC
  Administered 2014-03-18 – 2014-03-19 (×2): 40 mg via ORAL
  Filled 2014-03-17 (×2): qty 1

## 2014-03-17 MED ORDER — POTASSIUM CHLORIDE CRYS ER 20 MEQ PO TBCR
40.0000 meq | EXTENDED_RELEASE_TABLET | Freq: Once | ORAL | Status: AC
  Administered 2014-03-17: 40 meq via ORAL
  Filled 2014-03-17 (×2): qty 2

## 2014-03-17 MED ORDER — ENSURE COMPLETE PO LIQD
237.0000 mL | Freq: Three times a day (TID) | ORAL | Status: DC
  Administered 2014-03-17 – 2014-03-19 (×6): 237 mL via ORAL

## 2014-03-17 NOTE — Progress Notes (Signed)
Patient ID: [name] [name], female   DOB: June 20, 1966, 48 y.o.   MRN: [id number] [facility] PROGRESS NOTE  [name] [name] VHQ:[id number] DOB: [birth year] DOA: 03/02/2014 PCP: No primary provider on file.  Brief narrative: 48 year old hispanic female (smoker) seen in ED 02/27/14 with shortness of breath, she was treated and subsequently released home. She presented to [facility] ED 03/02/2014 with respiratory distress, had respiratory and cardiac arrest status post 1 round of epi, CPR and subsequently intubated. Initially she was acidotic but has improved with ventilatory support. Patient self extubated 03/07/2014 and required re- ntubation. Finally extubated 03/13/2014. Patient was under PCCM care until and including 03/14/2014 and transferred to [facility] starting 03/15/2014. Hospital course is complicated with seems to be possible ischemic cardiomyopathy. Patient had a 2-D echo on 03/03/2014 which revealed ejection fraction of 30-35% with inferior and posterior/lateral and septal hypokinesis. Patient underwent left heart catheterization 03/16/2014 which showed nonischemic cardiomyopathy.  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 Patient required 1 round of epinephrine, CPR and was intubated. She self extubated 03/07/2014 but has required reintubation. She was finally extubated 03/13/2014. PCCM transferred care to [facility] starting 03/15/2014. Respiratory status remains stable. Patient was treated adequately for pneumonia. Blood cultures are negative. Strep pneumonia and Legionella are negative.  Patient did have Quantiferon Gold positive but negative AFB x 3 so airborne precaution stopped per infectious disease recommendations.  Chest x-ray on 03/15/2014 showed no appreciable change compared with  prior study 03/14/2014. There is a scarring and pleural thickening on the right, no frank airspace consolidation and no pneumothorax. Patient is medically stable to be transferred to telemetry floor. Order placed. Active problems:  Acute encephalopathy  Patient has history of alcohol abuse. No reports of withdrawals so far. Good mental status.  Alcohol level was not obtained at the time of the admission. Leukocytosis  Likely secondary to community-acquired pneumonia for which patient received antibiotics appropriately. CBC this morning is pending. Acute decompensated systolic and diastolic CHF / ischemic cardiomyopathy  Patient was diuresed aggressively 9/12 through 9/15 per cardiology recommendations , had -8.8 L fluid balance. 2-D echo done 03/03/2014 showed ejection fraction 30-35%.  Left heart catheterization done 03/16/2014 and showed nonischemic cardiomyopathy Continue Coreg, dose increased to 6.25 mg by mouth twice a day. ACEi on hold because blood pressure still on soft side. Colonic ileus  Improved with NG suction. Patient does not have NG tube at this time. Abdomen is not distended. Heart healthy diet Acute renal failure  Likely secondary to cardiac and respiratory distress, Lasix.  Creatinine is now within normal limits. DVT Prophylaxis  Heparin subcutaneous   Code Status: Full.  Family Communication: Family not at the bedside this morning. Disposition Plan: Transfer to telemetry floor today. Obtain physical therapy evaluation for safe discharge plan.  IV Access:   Peripheral IV Procedures and diagnostic studies:   Dg Chest Port 1 View 03/15/2014 No appreciable change from 1 day prior. Scarring and pleural thickening on the right. Mild left base atelectasis. No frank airspace consolidation. Heart prominent but stable. No pneumothorax.  Dg Chest Port 1 View 03/14/2014 Scarring on the right. Left base atelectatic change. Mild cardiac enlargement. No pneumothorax.  CT-PA 03/02/14  No large PE (exam limited), B LAD, B LL predominant infiltrates and consolidation, small B effusions  TTE 03/03/14 Regional LV hypokinesis and dilation, EF  30-35%, dilated LA,  03/10/14 CT head Negative  03/11/14 CT abdomen Consistent with colonic ileus, bilateral effusions atelectasis  Left heart catheterization 03/16/2014 - nonischemic cardiomyopathy Medical Consultants:   Cardiology  Other Consultants:   Physical therapy  Anti-Infectives:   None   Manson Passey, MD  [facility] Pager [phone]  If 7PM-7AM, please contact night-coverage [email or web address] Password [email or web address] 03/17/2014, 8:21 AM   LOS: 15 days    HPI/Subjective: No acute overnight events.  Objective: Filed Vitals:   03/17/14 0200 03/17/14 0357 03/17/14 0500 03/17/14 0800  BP: 105/57   112/59  Pulse: 88   91  Temp:  98.1 F (36.7 C)    TempSrc:  Oral    Resp: 25   30  Height:      Weight:   58 kg (127 lb 13.9 oz)   SpO2: 100%   100%    Intake/Output Summary (Last 24 hours) at 03/17/14 4540 Last data filed at 03/17/14 0500  Gross per 24 hour  Intake  282.5 ml  Output    950 ml  Net -667.5 ml    Exam:   General:  Pt is sleeping this morning, no distress  Cardiovascular: Regular rate and rhythm, S1/S2 appreciated  Respiratory: Clear to auscultation bilaterally, no wheezing  Abdomen: Soft, non tender, non distended, bowel sounds present  Extremities: No edema, pulses DP and PT palpable bilaterally  Neuro: No focal neurological deficits  Data Reviewed: Basic Metabolic Panel:  Recent Labs Lab 03/11/14 0615 03/12/14 0450 03/13/14 0600 03/14/14 0530 03/15/14 0530 03/16/14 1825  NA 139 137 139 137 134*  --   K 4.7 3.9 3.7 4.3 4.0  --   CL 99 94* 93* 95* 92*  --   CO2 --   GLUCOSE 144* 85 87 92 108*  --   BUN 26* 29* 28* 28* 26*  --   CREATININE 0.79 0.97 1.12* 0.94 0.90 0.83  CALCIUM 9.9 9.8 10.2 10.1 10.3  --   MG 2.5 2.4 2.6* 2.4 2.3  --   PHOS 3.4 4.0 5.3* 4.6 4.0  --     Liver Function Tests:  Recent Labs Lab 03/11/14 0615  AST 27  ALT 46*  ALKPHOS 89  BILITOT 0.7  PROT 8.2  ALBUMIN 3.0*   No results found for this basename: LIPASE, AMYLASE,  in the last 168 hours No results found for this basename: AMMONIA,  in the last 168 hours CBC:  Recent Labs Lab 03/12/14 0450 03/13/14 0600 03/14/14 0530 03/15/14 0530 03/16/14 1825  WBC 17.1* 16.5* 16.5* 16.2* 14.9*  NEUTROABS 13.3* 12.7*  --   --   --   HGB 9.4* 10.2* 10.2* 10.8* 10.3*  HCT 28.1* 30.9* 30.9* 32.7* 30.5*  MCV 89.5 89.8 89.6 91.1 88.9  PLT 513* 588* 648* 637* 608*   Cardiac Enzymes: No results found for this basename: CKTOTAL, CKMB, CKMBINDEX, TROPONINI,  in the last 168 hours BNP: No components found with this basename: POCBNP,  CBG:  Recent Labs Lab 03/15/14 1603 03/15/14 2121 03/16/14 0758 03/16/14 1726 03/16/14 2112  GLUCAP 121* 116* 118* 137* 112*    No results found for this or any previous visit (from the past 240 hour(s)).   Scheduled Meds: . aspirin EC  81 mg Oral Daily  . carvedilol  3.125 mg Oral Once  . carvedilol  6.25 mg Oral BID WC  . heparin  5,000 Units Subcutaneous 3 times per day  . insulin aspart  0-15  Units Subcutaneous TID WC  . pantoprazole (PROTONIX) IV  40 mg Intravenous Daily   Continuous Infusions: . sodium chloride 10 mL/hr at 03/14/14 1900

## 2014-03-17 NOTE — Progress Notes (Signed)
Pt arrived to unit from ICU on bed, slid self to floor bed. VSS, tele box 40 confirmed to CCMD. Pt oriented to callbell and environment, POC discussed w/ pt using translator phone service. Fall precautions reviewed, pt questions answered. Assessment as charted. Bed alarm on. Pt sitting in bed, eating lunch. Denies pain/SOB/discomfort/needs at present.

## 2014-03-17 NOTE — Progress Notes (Signed)
This patient is receiving Protonix. Based on criteria approved by the Pharmacy and Therapeutics Committee, this medication is being converted to the equivalent oral dose form. These criteria include:   . The patient is eating (either orally or per tube) and/or has been taking other orally administered medications for at least 24 hours.  . This patient has no evidence of active gastrointestinal bleeding or impaired GI absorption (gastrectomy, short bowel, patient on TNA or NPO).   If you have questions about this conversion, please contact the pharmacy department.  Berkley Harvey, [facility] 03/17/2014 10:57 AM

## 2014-03-17 NOTE — Progress Notes (Signed)
Patient Name: [name] [name] Date of Encounter: 03/17/2014     Active Problems:   Respiratory failure   CAP (community acquired pneumonia)   ARDS (adult respiratory distress syndrome)   Encephalopathy acute   Acute respiratory failure with hypoxia    SUBJECTIVE  No apparent distress. No dyspnea. Rhythm regular NSR. Cath yesterday showed normal coronary arteries.  CURRENT MEDS . antiseptic oral rinse  7 mL Mouth Rinse QID  . aspirin EC  81 mg Oral Daily  . carvedilol  3.125 mg Oral BID WC  . chlorhexidine  15 mL Mouth Rinse BID  . heparin  5,000 Units Subcutaneous 3 times per day  . insulin aspart  0-15 Units Subcutaneous TID WC  . pantoprazole (PROTONIX) IV  40 mg Intravenous Daily  . sodium chloride  3 mL Intravenous Q12H    OBJECTIVE  Filed Vitals:   03/17/14 0017 03/17/14 0200 03/17/14 0357 03/17/14 0500  BP:  105/57    Pulse:  88    Temp: 98.5 F (36.9 C)  98.1 F (36.7 C)   TempSrc: Oral  Oral   Resp:  25    Height:      Weight:    127 lb 13.9 oz (58 kg)  SpO2:  100%      Intake/Output Summary (Last 24 hours) at 03/17/14 0800 Last data filed at 03/17/14 0500  Gross per 24 hour  Intake  282.5 ml  Output    950 ml  Net -667.5 ml   Filed Weights   03/15/14 0427 03/16/14 0400 03/17/14 0500  Weight: 125 lb 10.6 oz (57 kg) 123 lb 10.9 oz (56.1 kg) 127 lb 13.9 oz (58 kg)    PHYSICAL EXAM  General: Pleasant, NAD. Neuro: Alert and oriented X 3. Moves all extremities spontaneously. Psych: Normal affect. HEENT:  Normal  Neck: Supple without bruits or JVD. Lungs:  Resp regular and unlabored, CTA. Heart: RRR no s3, s4, or murmurs. Abdomen: Soft, non-tender, non-distended, BS + x 4.  Extremities: No clubbing, cyanosis or edema. DP/PT/Radials 2+ and equal bilaterally. Good right radial pulse.  Accessory Clinical Findings  CBC  Recent Labs  03/15/14 0530 03/16/14 1825  WBC 16.2* 14.9*  HGB 10.8* 10.3*  HCT 32.7* 30.5*  MCV 91.1 88.9  PLT 637*  608*   Basic Metabolic Panel  Recent Labs  03/15/14 0530 03/16/14 1825  NA 134*  --   K 4.0  --   CL 92*  --   CO2 23  --   GLUCOSE 108*  --   BUN 26*  --   CREATININE 0.90 0.83  CALCIUM 10.3  --   MG 2.3  --   PHOS 4.0  --    Liver Function Tests No results found for this basename: AST, ALT, ALKPHOS, BILITOT, PROT, ALBUMIN,  in the last 72 hours No results found for this basename: LIPASE, AMYLASE,  in the last 72 hours Cardiac Enzymes No results found for this basename: CKTOTAL, CKMB, CKMBINDEX, TROPONINI,  in the last 72 hours BNP No components found with this basename: POCBNP,  D-Dimer No results found for this basename: DDIMER,  in the last 72 hours Hemoglobin A1C No results found for this basename: HGBA1C,  in the last 72 hours Fasting Lipid Panel No results found for this basename: CHOL, HDL, LDLCALC, TRIG, CHOLHDL, LDLDIRECT,  in the last 72 hours Thyroid Function Tests No results found for this basename: TSH, T4TOTAL, FREET3, T3FREE, THYROIDAB,  in the last 72 hours  TELE  NSR  ECG     Radiology/Studies  Ct Abdomen Pelvis Wo Contrast  03/11/2014   CLINICAL DATA:  Patient on ventilator, abdominal distention  EXAM: CT ABDOMEN AND PELVIS WITHOUT CONTRAST  TECHNIQUE: Multidetector CT imaging of the abdomen and pelvis was performed following the standard protocol without IV contrast.  COMPARISON:  Abdominal radiograph 03/11/2014  FINDINGS: Oral contrast was given by NG tube. Oral contrast is seen within the stomach. Oral contrast has also progressed into small bowel and to a lesser degree through the colon with a small volume of oral contrast into the rectum. Rectum is distended and fluid-filled, with a diameter of 7.2 cm. There are air-fluid levels throughout the colon. Transverse colon is distended 2 6.7 cm. Cecum measures 7.6 cm. Small bowel is relatively decompressed. There is no evidence of free air.  Bladder is decompressed by Foley catheter. There are no  abnormalities involving reproductive organs. There is trace free fluid in the pelvis. Aorta is nondilated.  Given limited evaluation without IV contrast, liver and spleen are normal. Gallbladder appears normal. Pancreas is normal. Adrenal glands and kidneys are normal. NG tube extends into the stomach with tip in the region of the antrum.  There are no acute musculoskeletal findings. There is interstitial prominence in the visualized portions of the lung bases suggesting pulmonary edema. There is cardiac enlargement. There is a small right in a moderate left pleural effusion. Bilateral lower lobe consolidation dependently is likely related to atelectasis.  IMPRESSION: 1. Findings most consistent with colonic ileus 2. Findings consistent with pulmonary edema, pleural effusions, and bilateral dependent atelectasis.   Electronically Signed   By: Esperanza Heir M.D.   On: 03/11/2014 10:19   Dg Chest 2 View  02/27/2014   CLINICAL DATA:  Mid back pain  EXAM: CHEST  2 VIEW  COMPARISON:  None.  FINDINGS: The lungs are markedly abnormal bilaterally. Within the right lung, there is volume loss. Heterogeneous opacities are scattered throughout the lung with relative lucency in the upper lobe. This may represent cavitation. There is pleural thickening at the right apex. There is more confluent opacity in the posterior upper lobe, likely the right. Underlying mass at the right apex is not excluded.  Date heterogeneous opacities are present at the left base. There is irregular opacity extending into the left apex as well. These densities are predominantly linear.  IMPRESSION: Abnormal lungs as described. An inflammatory process or malignancy are not excluded. Comparison with prior studies would be helpful. If none are available, CT chest is recommended to exclude mass.   Electronically Signed   By: Maryclare Bean M.D.   On: 02/27/2014 12:08   Ct Head Wo Contrast  03/10/2014   CLINICAL DATA:  Encephalopathy, altered mental  status.  EXAM: CT HEAD WITHOUT CONTRAST  TECHNIQUE: Contiguous axial images were obtained from the base of the skull through the vertex without intravenous contrast.  COMPARISON:  None.  FINDINGS: The ventricles and sulci are normal. No intraparenchymal hemorrhage, mass effect nor midline shift. No acute large vascular territory infarcts.  No abnormal extra-axial fluid collections. Basal cisterns are patent.  No skull fracture. The included ocular globes and orbital contents are non-suspicious. The mastoid aircells and included paranasal sinuses are well-aerated. Life support lines seen on the scout view.  IMPRESSION: No acute intracranial process.  Normal noncontrast CT of the head.   Electronically Signed   By: Awilda Metro   On: 03/10/2014 01:01   Ct Chest W Contrast  02/27/2014  CLINICAL DATA:  Shortness of breath. Chest and back pain. Right apical opacity on chest radiograph.  EXAM: CT CHEST WITH CONTRAST  TECHNIQUE: Multidetector CT imaging of the chest was performed during intravenous contrast administration.  CONTRAST:  62mL OMNIPAQUE IOHEXOL 300 MG/ML  SOLN  COMPARISON:  None.  FINDINGS: Mediastinum/Hilar Regions: Mild soft tissue density seen in the right paratracheal region measuring 1.3 cm on image 13. Mild subcarinal lymphadenopathy also seen measuring 1.3 cm. No evidence of lymph node calcification.  Other Thoracic Lymphadenopathy:  None.  Lungs: Asymmetric pleural-parenchymal opacity in the right lung apex shows associated peripheral bronchiectasis, consistent with scarring. Other areas of scarring are also seen bilaterally involving the right lung greater than left.  Diffuse thickening of pulmonary interlobular septi also demonstrated with bibasilar predominance, and mild interstitial edema cannot be excluded. No evidence of pulmonary consolidation or discrete pulmonary mass. No evidence of central endobronchial obstruction.  Pleura: Tiny bilateral pleural effusions noted, left side greater  than right.  Vascular/Cardiac: No thoracic aortic aneurysm or other significant abnormality identified.  Musculoskeletal:  No suspicious bone lesions identified.  Other:  None.  IMPRESSION: Tiny bilateral pleural effusions and thickening of pulmonary interlobular septi in both lung bases. Although this could be due to chronic interstitial lung disease, mild edema or interstitial pneumonitis cannot be excluded. Continued chest radiographic followup is recommended.  Bilateral pleural- parenchymal scarring with associated traction bronchiectasis in right lung apex.  Mild mediastinal lymphadenopathy in the right paratracheal and subcarinal regions, which is nonspecific and may be reactive in etiology. Continued followup by CT is recommended in 3-6 months.   Electronically Signed   By: Myles Rosenthal M.D.   On: 02/27/2014 14:31   Ct Angio Chest Pe W/cm &/or Wo Cm  03/02/2014   CLINICAL DATA:  Respiratory and cardiac arrest. Evaluate for pulmonary embolism  EXAM: CT ANGIOGRAPHY CHEST WITH CONTRAST  TECHNIQUE: Multidetector CT imaging of the chest was performed using the standard protocol during bolus administration of intravenous contrast. Multiplanar CT image reconstructions and MIPs were obtained to evaluate the vascular anatomy.  CONTRAST:  OMNIPAQUE IOHEXOL 350 MG/ML SOLN  COMPARISON:  02/27/2014  FINDINGS: THORACIC INLET/BODY WALL:  Interval tracheal and esophageal intubation. The nasogastric tube continues into the stomach at least. Endotracheal tube ends 1 cm above the carina, deeper than on preceding chest x-ray.  There is symmetric ill-defined fluid within the visceral neck compartment, without definitive cause. This could be reactive to recent intubation or from a generalized edematous state. No fluid collection or subcutaneous emphysema.  MEDIASTINUM:  Cardiomegaly, mainly related to left ventricular enlargement. No pericardial effusion. No significant atherosclerotic change. No acute aortic findings.   Evaluation of the pulmonary arterial tree is limited at multiple levels due to respiratory motion. In general the exam is diagnostic to the level of the proximal most segmental pulmonary arteries. No evidence of pulmonary embolism.  Enlargement of subcarinal and hilar lymph nodes which is symmetric and presumably reactive. Mediastinal node followup has been previously recommended.  LUNG WINDOWS:  Diffuse lung consolidation. There is a dependent predilection, although the consolidation is panlobar. No definitive debris within the central airways. The right upper lobe bronchi are distorted and narrowed, with chronic hyperlucency of the right upper lobe consistent with air trapping. The vessels in this region are likewise attenuated. Given posterior segment calcifications bilaterally, this is likely postinfectious scarring - possibly remote TB. Small bilateral pleural effusions without loculation or abnormal pleural enhancement.  UPPER ABDOMEN:  No acute findings.  OSSEOUS:  No acute findings.  Review of the MIP images confirms the above findings.  IMPRESSION: 1. Multi lobar pneumonia with small pleural effusions. The pattern and history of cardiac arrest implicates aspiration. 2. As permitted by moderate motion degradation, no evidence of pulmonary embolism. 3. Endotracheal tube lower than at time of placement, now ending 1 cm above the carina. 4. Left ventricular enlargement.  No definite pulmonary edema. 5. Right upper lobe scarring with air trapping, likely from remote infection.   Electronically Signed   By: Tiburcio Pea M.D.   On: 03/02/2014 13:12   Dg Chest Port 1 View  03/15/2014   CLINICAL DATA:  Pneumonia  EXAM: PORTABLE CHEST - 1 VIEW  COMPARISON:  March 14, 2014  FINDINGS: Central catheter tip is in the superior vena cava. No pneumothorax. Areas of scarring and apical pleural thickening on the right remain stable. There is mild atelectatic change in the left base, stable. There is no new opacity.  There is no frank consolidation. Heart is mildly enlarged with pulmonary vascularity within normal limits, stable. No appreciable adenopathy.  IMPRESSION: No appreciable change from 1 day prior. Scarring and pleural thickening on the right. Mild left base atelectasis. No frank airspace consolidation. Heart prominent but stable. No pneumothorax.   Electronically Signed   By: Bretta Bang M.D.   On: 03/15/2014 08:04   Dg Chest Port 1 View  03/14/2014   CLINICAL DATA:  Atelectasis  EXAM: PORTABLE CHEST - 1 VIEW  COMPARISON:  March 13, 2014  FINDINGS: Endotracheal and nasogastric tube have been removed. No pneumothorax. Central catheter tip is in the superior vena cava. No pneumothorax. There is asymmetric apical pleural thickening on the right with scattered scarring in the right lung, stable. There is atelectatic change in the left base. There is no appreciable consolidation.  Heart is mildly enlarged with pulmonary vascularity within normal limits. No adenopathy.  IMPRESSION: Scarring on the right. Left base atelectatic change. Mild cardiac enlargement. No pneumothorax.   Electronically Signed   By: Bretta Bang M.D.   On: 03/14/2014 07:08   Dg Chest Port 1 View  03/13/2014   CLINICAL DATA:  Assess endotracheal tube position.  EXAM: PORTABLE CHEST - 1 VIEW  COMPARISON:  Portable chest x-ray of March 12, 2014  FINDINGS: The lungs are reasonably well inflated. The retrocardiac density on the left is slightly increased. The left hemidiaphragm remains partially obscured. The cardiac silhouette is normal. The pulmonary vascularity is not engorged.  The endotracheal tube tip lies 3.1 cm above the crotch of the carina. The left internal jugular venous catheter tip lies in the proximal SVC. The esophagogastric tube tip projects below the inferior margin of the image.  IMPRESSION: There has not been significant interval change in the appearance of the chest though mild worsening of left lower lobe  atelectasis is not excluded. The support tubes and lines are in appropriate position.   Electronically Signed   By: David  [place]   On: 03/13/2014 07:13   Dg Chest Port 1 View  03/12/2014   CLINICAL DATA:  Evaluate endotracheal tube placement.  EXAM: PORTABLE CHEST - 1 VIEW  COMPARISON:  Chest x-ray 03/11/2014.  FINDINGS: An endotracheal tube is in place with tip 3.9 cm above the carina. There is a left-sided internal jugular central venous catheter with tip terminating in the mid superior vena cava. A nasogastric tube is seen extending into the stomach, however, the tip of the nasogastric tube extends below the lower margin  of the image. Lung volumes remain slightly low, but have improved compared to the recent prior examination. Again noted is a background of chronic increased interstitial markings throughout the lungs bilaterally with hyperlucency and architectural distortion in the right upper lobe, similar to multiple prior examinations. No definite acute consolidative airspace disease. No pleural effusions. No evidence of pulmonary edema. Mild cardiomegaly is unchanged. The patient is rotated to the right on today's exam, resulting in distortion of the mediastinal contours and reduced diagnostic sensitivity and specificity for mediastinal pathology.  IMPRESSION: 1. Support apparatus, as above. 2. Chronic changes in the lungs redemonstrated, suggestive of extensive areas of scarring related to prior episodes of infection or inflammation (e.g., ARDS).   Electronically Signed   By: Trudie Reed M.D.   On: 03/12/2014 07:20   Dg Chest Port 1 View  03/11/2014   CLINICAL DATA:  Evaluate endotracheal tube  EXAM: PORTABLE CHEST - 1 VIEW  COMPARISON:  03/10/2014  FINDINGS: Endotracheal tube tip is identified 1.7 cm above the carina. No change left central line. Stable cardiac enlargement exaggerated by limited inspiratory effect. There is fibrotic change in the right middle lobe. There is stable mild diffuse  interstitial change.  There is gaseous distention of the stomach. NG tube is unchanged, again seen passing into the stomach.  IMPRESSION: No significant interval change   Electronically Signed   By: Esperanza Heir M.D.   On: 03/11/2014 07:20   Dg Chest Port 1 View  03/10/2014   CLINICAL DATA:  Hypoxia  EXAM: PORTABLE CHEST - 1 VIEW  COMPARISON:  March 09, 2014  FINDINGS: Endotracheal tube tip is 2.0 cm above the carina. Nasogastric tube tip and side port are in the stomach. Central catheter tip is in the superior vena cava. No pneumothorax. There is left base consolidation. There is scarring in the right mid and lower lung zones. There is also right apical pleural thickening which is stable. Heart is mildly enlarged with pulmonary vascularity within normal limits. No adenopathy.  IMPRESSION: Tube and catheter positions as described without pneumothorax. Persistent left lower lobe consolidation. Areas of scarring on the right appear stable.   Electronically Signed   By: Bretta Bang M.D.   On: 03/10/2014 12:08   Dg Chest Port 1 View  03/09/2014   CLINICAL DATA:  Decreased oxygen saturation  EXAM: PORTABLE CHEST - 1 VIEW  COMPARISON:  03/08/2014  FINDINGS: An endotracheal tube is again identified with the tip 10 mm above the carina. It is stable in appearance. A left central venous line is again noted and stable. Nasogastric catheter is coiled within the stomach. Bilateral nipple shadows are seen. Bilateral infiltrates are noted with improved aeration particularly in the left lung base.  IMPRESSION: Mild infiltrates are again identified bilaterally. Improved aeration is noted.  The endotracheal tube still lies low within the trachea and could be withdrawn 1-2 cm. No new focal abnormality is seen.   Electronically Signed   By: Alcide Clever M.D.   On: 03/09/2014 20:22   Dg Chest Port 1 View  03/08/2014   CLINICAL DATA:  Check endotracheal tube position  EXAM: PORTABLE CHEST - 1 VIEW  COMPARISON:   03/07/2014  FINDINGS: Endotracheal tube now lies 1 cm above the carina and is relatively stable from the prior exam. The cardiac shadow is stable. A left jugular central line and nasogastric catheter are again seen. Bilateral basilar infiltrates are seen as well as apical changes on the right. The overall appearance is stable.  IMPRESSION: Stable bilateral infiltrates.  Tubes and lines as described above. Again the endotracheal tube could be withdrawn 1-2 cm.   Electronically Signed   By: Alcide Clever M.D.   On: 03/08/2014 07:36   Dg Chest Port 1 View  03/07/2014   CLINICAL DATA:  Cough.  Intubation.  EXAM: PORTABLE CHEST - 1 VIEW  COMPARISON:  03/07/2014  FINDINGS: Endotracheal tube tip is localized at the carina. Retraction about 2 cm is recommended. Positioning of the tube is unchanged since previous studies. Enteric tube tip is off the field of view but below the left hemidiaphragm. Left central venous catheter is projected over the mid SVC region. Cardiac enlargement with pulmonary vascular congestion and bilateral parenchymal infiltrates. Findings likely due to edema although pneumonia or ARDS could also have this appearance. Bilateral pleural effusions. No significant change since previous study.  IMPRESSION: Cardiac enlargement with pulmonary vascular congestion and diffuse pulmonary infiltration. Bilateral pleural effusions. Appliances are unchanged in position. The endotracheal tube the tip is again noted at the carina and retraction of about 2 cm is again recommended.   Electronically Signed   By: Burman Nieves M.D.   On: 03/07/2014 22:07   Dg Chest Port 1 View  03/07/2014   CLINICAL DATA:  Intubated patient.  EXAM: PORTABLE CHEST - 1 VIEW  COMPARISON:  03/06/2014  FINDINGS: Endotracheal tube tip lies at the chronic. It appears unchanged from the previous exam.  Hazy bilateral airspace lung opacities, most confluent in the right perihilar and lower lung zone, have increased, some of which is likely  due to lower lung volumes on the current exam. There are small bilateral pleural effusions. Scarring and pleural thickening at the apices, greater on the right, is stable. No pneumothorax.  Left internal jugular central venous line tip is well positioned in the lower superior vena cava. Nasogastric tube tip passes below the diaphragm and below the included field of view.  IMPRESSION: 1. Tip of the endotracheal tube projects at the carina. Consider retracting 1-2 cm for more optimal positioning. 2. Lung aeration appears worsened when compared to the previous day's study although the difference may be due to lower lung volumes on the current exam.   Electronically Signed   By: Amie Portland M.D.   On: 03/07/2014 07:32   Dg Chest Port 1 View  03/06/2014   CLINICAL DATA:  Respiratory failure.  EXAM: PORTABLE CHEST - 1 VIEW  COMPARISON:  03/04/2014.  FINDINGS: Endotracheal tube tip is at the level of carina. Recommend retracting by 2-3 cm to avoid mainstem bronchus intubation with change of patients head position.  Nasogastric tube courses below the diaphragm. Tip is not included on the present exam.  Left central line tip projects at the level of the mid to distal superior vena cava. No gross pneumothorax.  Right apical parenchymal changes similar to prior exam.  Interval improved aeration mid to lower lung zones.  Persistent pulmonary vascular congestion/ pulmonary edema.  Retrocardiac opacity may represent atelectasis with pleural fluid. Infiltrate not excluded. Recommend followup until clearance.  Cardiomegaly.  IMPRESSION: Endotracheal tube tip needs to be repositioned as discussed above.  Interval slight improved aeration mid to lower lung zones. Please see above discussion.  These results were called by telephone at the time of interpretation on 03/06/2014 at 8:05 am to [facility] patient's nurse, who verbally acknowledged these results.   Electronically Signed   By: Bridgett Larsson M.D.   On: 03/06/2014 08:08   Dg Chest  Port 1  View  03/04/2014   CLINICAL DATA:  ETT placement  EXAM: PORTABLE CHEST - 1 VIEW  COMPARISON:  03/03/2014  FINDINGS: Endotracheal tube terminates 7 mm above the carina.  Multifocal patchy opacities in the lungs bilaterally, worrisome for multifocal pneumonia, less likely moderate interstitial edema. Small bilateral pleural effusions. No pneumothorax.  The heart is top-normal in size.  Left IJ venous catheter terminates at the cavoatrial junction. Enteric tube courses below the diaphragm.  IMPRESSION: Endotracheal tube terminates 7 mm above the carina.  Multifocal patchy opacities in the lungs bilaterally, worrisome for multifocal pneumonia.  Small bilateral pleural effusions.   Electronically Signed   By: Charline Bills M.D.   On: 03/04/2014 07:11   Dg Chest Port 1 View  03/03/2014   CLINICAL DATA:  Endotracheal tube patient, tube placed yesterday, evaluate lungs  EXAM: PORTABLE CHEST - 1 VIEW  COMPARISON:  03/02/2014  FINDINGS: No change in position of endotracheal tube with tip 1 cm above the carina. Left subclavian central line and NG tube unchanged.  Extensive airspace opacity over the right middle and lower lung zones, stable allowing for differences in technique. Diffuse mixed alveolar and interstitial opacity throughout the left lung, most consolidated in the lower lobe, also stable.  IMPRESSION: No change when compared to radiograph performed yesterday.   Electronically Signed   By: Esperanza Heir M.D.   On: 03/03/2014 09:20   Dg Chest Port 1 View  03/02/2014   CLINICAL DATA:  Central line placement  EXAM: PORTABLE CHEST - 1 VIEW  COMPARISON:  03/02/2014  FINDINGS: Endotracheal tube 1 cm above the carina.  Left jugular catheter placement with the tip in the SVC. No pneumothorax. NG tube in the stomach  Diffuse bilateral airspace disease unchanged. Small bilateral effusions unchanged. Question pneumonia versus heart failure  IMPRESSION: Endotracheal tube 1 cm above the carina  Sent factors  central line placement  Diffuse bilateral airspace disease and bilateral effusions unchanged.   Electronically Signed   By: Marlan Palau M.D.   On: 03/02/2014 15:45   Dg Chest Portable 1 View  03/02/2014   CLINICAL DATA:  Cardiac and respiratory arrest  EXAM: PORTABLE CHEST - 1 VIEW  COMPARISON:  Portable chest x-ray of March 02, 2014 at 0952 hr.  FINDINGS: The patient has undergone interval intubation of the trachea. The tip of the tube lies approximately 3 0.6 cm above the crotch of the carina. The esophagogastric tube tip projects off the inferior margin of the image. There are external pacing and defibrillation pannus present. The lungs are adequately inflated but exhibit diffusely increased interstitial markings more conspicuous than earlier today. There is no pneumothorax. A small pleural effusion on the right is suspected but is unchanged.  IMPRESSION: The endotracheal and esophagogastric tubes appear to be in appropriate position. There is diffuse interstitial edema more conspicuous than on the earlier study.   Electronically Signed   By: David  [place]   On: 03/02/2014 11:09   Dg Chest Portable 1 View  03/02/2014   CLINICAL DATA:  Short of breath  EXAM: PORTABLE CHEST - 1 VIEW  COMPARISON:  02/27/2014  FINDINGS: Cardiac enlargement with progression of diffuse bilateral airspace disease, possible pneumonia versus infection. Right pleural effusion slightly larger. No left effusion. Bibasilar atelectasis has progressed.  IMPRESSION: Worsening bilateral airspace disease. This may represent pulmonary edema versus infection.   Electronically Signed   By: Marlan Palau M.D.   On: 03/02/2014 10:16   Dg Abd Portable 1v  03/11/2014  CLINICAL DATA:  Abdominal distention.  EXAM: PORTABLE ABDOMEN - 1 VIEW  COMPARISON:  Abdominal radiograph performed 03/10/2014  FINDINGS: There is diffuse dilatation of small and large bowel loops throughout the abdomen and pelvis, and of the stomach, compatible with ileus.  This is similar in appearance to the prior study. The patient's enteric tube is noted ending at the antrum of the stomach. No free intra-abdominal air is seen, though evaluation for free air is limited on a single supine view.  The visualized osseous structures are within normal limits; the sacroiliac joints are unremarkable in appearance. Mild bibasilar atelectasis is noted.  IMPRESSION: Relatively stable appearance to diffuse ileus. No free intra-abdominal air seen.   Electronically Signed   By: Roanna Raider M.D.   On: 03/11/2014 06:12   Dg Abd Portable 1v  03/10/2014   CLINICAL DATA:  Abdominal distention  EXAM: PORTABLE ABDOMEN - 1 VIEW  COMPARISON:  None.  FINDINGS: There is generalized bowel dilatation. A small amount of air is present in the rectum. No free air is seen on this supine examination. Nasogastric tube tip and side port are in the body of the stomach. There are phleboliths in the pelvis.  IMPRESSION: The bowel gas pattern is most consistent with ileus, although a degree of distal obstruction cannot be entirely excluded. No free air is seen on this supine examination. Nasogastric tube tip and side port in stomach.   Electronically Signed   By: Bretta Bang M.D.   On: 03/10/2014 12:07    ASSESSMENT AND PLAN  Nonischemic cardiomyopathy.  Plan: Will increase carvedilol to 6.25 BID. BP is still soft so hold off on starting ACEi yet. Okay to transfer to telemetry and increase activity.  Signed, Cassell Clement MD

## 2014-03-17 NOTE — Progress Notes (Signed)
Notified MD of K+3.5 from morning labs.

## 2014-03-17 NOTE — Progress Notes (Signed)
NUTRITION FOLLOW UP  INTERVENTION: - Ordered lunch for pt - Ensure Complete TID - RD to continue to monitor   NUTRITION DIAGNOSIS: Inadequate oral intake related to inability to eat as evidenced by npo status - ongoing now related to poor appetite as evidenced by pt report.   Goal: Diet advancement - met  New goal: Pt to consume >90% of meals/supplements  Monitor:  Weights, labs, intake     ASSESSMENT: Acute respiratory failure, multilobar LL predominant CAP, ARDS.  Post cardiac arrest secondary to respiratory arrest, shock.  9/4:  Family not available.  Spoke with nurse.  Patient requires maximum sedation.  Intubated.  9/7: -Round with MD and nursing -No BM since admit -Order to begin TF -Propofol d/c'ed  9/8: -Remains intubated  -Per RN, pt without any TF residuals today  -Remains on ARDS protocol  -Weight up 13 pounds since admission, with generalized edema -TF via OGT: Oxepa at 45 ml/hr with Prostat 72m BID which provides 1820 kcal, 98 gm protein, 853 ml water and meet 103% estimated kcal and 103% estimated protein needs.   9/14: -Extubated this morning, TF d/c -Had abdominal distention 9/12 due to colonic ileus per MD notes -MD noted today ileus markedly improved with NG suction -Per family member at bedside, pt unable to talk -Family reports pt eating 3 meals/day with stable weight PTA  9/18: -Noted events since last RD visit including fall on 9/15 -Sent to cath lab 9/17 showing normal coronary arteries -Attempted to meet with pt however pt was asleep -RN reports pt not eating despite much encouragement from RN to eat   Height: Ht Readings from Last 1 Encounters:  03/02/14 5' 6" (1.676 m)    Weight: Wt Readings from Last 1 Encounters:  03/17/14 127 lb 13.9 oz (58 kg)  Admit wt         130 lb (58.9 kg)   Estimated Nutritional Needs: Kcal: 1600-1800 Protein: 85-95 gm  Fluid: 1.6-1.8 L daily  Skin: intact  Diet Order:  Cardiac     Intake/Output Summary (Last 24 hours) at 03/17/14 1031 Last data filed at 03/17/14 0900  Gross per 24 hour  Intake  282.5 ml  Output   1100 ml  Net -817.5 ml    Last BM: 9/16  Labs:   Recent Labs Lab 03/13/14 0600 03/14/14 0530 03/15/14 0530 03/16/14 1825 03/17/14 0915  NA 139 137 134*  --  133*  K 3.7 4.3 4.0  --  3.5*  CL 93* 95* 92*  --  97  CO2 _0 --  22  BUN 28* 28* 26*  --  18  CREATININE 1.12* 0.94 0.90 0.83 0.85  CALCIUM 10.2 10.1 10.3  --  9.2  MG 2.6* 2.4 2.3  --   --   PHOS 5.3* 4.6 4.0  --   --   GLUCOSE 87 92 108*  --  85    CBG (last 3)   Recent Labs  03/16/14 1726 03/16/14 2112 03/17/14 0750  GLUCAP 137* 112* 83    Scheduled Meds: . antiseptic oral rinse  7 mL Mouth Rinse QID  . aspirin EC  81 mg Oral Daily  . carvedilol  6.25 mg Oral BID WC  . chlorhexidine  15 mL Mouth Rinse BID  . heparin  5,000 Units Subcutaneous 3 times per day  . insulin aspart  0-15 Units Subcutaneous TID WC  . pantoprazole (PROTONIX) IV  40 mg Intravenous Daily  . sodium chloride  3  mL Intravenous Q12H    Continuous Infusions: . sodium chloride 10 mL/hr at 03/14/14 1900    Past Medical History  Diagnosis Date  . Back pain   . Gastritis     Past Surgical History  Procedure Laterality Date  . Cesarean section      Carlis Stable MS, [place], [place] Pager [phone] Weekend/After Hours Pager

## 2014-03-17 NOTE — Evaluation (Signed)
Physical Therapy Evaluation Patient Details Name: [name] [name] MRN: [id number] DOB: 10-04-1965 Today's Date: 03/17/2014   History of Present Illness  Pt is a 48 year old female admitted 03/02/2014 with respiratory distress, had respiratory and cardiac arrest after 1 round of epi, CPR and subsequently intubated.  Patient self extubated 03/07/2014 and required re-intubation. Extubated 03/13/2014.  Pt admitted for Acute respiratory failure with hypoxia / ARDS (adult respiratory distress syndrome) / Multilobar community-acquired pneumonia.  Clinical Impression  Pt currently with functional limitations due to the deficits listed below (see PT Problem List). Pt will benefit from skilled PT to increase their independence and safety with mobility to allow discharge to the venue listed below.  Pt mobilizing well today.   Used RW for first time OOB however feel pt will progress well, likely to no needs.  Pt reports no pain during session and no signs of distress observed.     Follow Up Recommendations No PT follow up    Equipment Recommendations  Cane (may not need upon d/c)    Recommendations for Other Services       Precautions / Restrictions Precautions Precautions: Fall Restrictions Weight Bearing Restrictions: No      Mobility  Bed Mobility Overal bed mobility: Modified Independent                Transfers Overall transfer level: Needs assistance Equipment used: 1 person hand held assist Transfers: Sit to/from Stand Sit to Stand: Min guard         General transfer comment: min/guard for safety  Ambulation/Gait Ambulation/Gait assistance: Min guard Ambulation Distance (Feet): 160 Feet Assistive device: Rolling walker (2 wheeled) Gait Pattern/deviations: Step-through pattern;Trunk flexed     General Gait Details: initially one HHA however pt a little unsteady so provided RW and educated through visual cues on positioning  Stairs            Wheelchair Mobility    Modified Rankin (Stroke Patients Only)       Balance                                             Pertinent Vitals/Pain Pain Assessment: No/denies pain    Home Living Family/patient expects to be discharged to:: Private residence Living Arrangements: Non-relatives/Friends     Home Access: Level entry     Home Layout: One level Home Equipment: None      Prior Function Level of Independence: Independent               Hand Dominance        Extremity/Trunk Assessment               Lower Extremity Assessment: Generalized weakness         Communication   Communication: Prefers language other than English;Interpreter utilized  Cognition Arousal/Alertness: Awake/alert Behavior During Therapy: WFL for tasks assessed/performed Overall Cognitive Status: Within Functional Limits for tasks assessed                      General Comments      Exercises        Assessment/Plan    PT Assessment Patient needs continued PT services  PT Diagnosis Difficulty walking   PT Problem List Decreased strength;Decreased balance;Decreased mobility;Decreased knowledge of use of DME  PT Treatment Interventions DME instruction;Gait training;Functional mobility training;Therapeutic activities;Therapeutic exercise;Patient/family education;Balance training  PT Goals (Current goals can be found in the Care Plan section) Acute Rehab PT Goals PT Goal Formulation: With patient Time For Goal Achievement: 03/24/14 Potential to Achieve Goals: Good    Frequency Min 3X/week   Barriers to discharge        Co-evaluation               End of Session   Activity Tolerance: Patient tolerated treatment well Patient left: in bed;with call bell/phone within reach;with bed alarm set           Time: 6945-0388 PT Time Calculation (min): 17 min   Charges:   PT Evaluation $Initial PT Evaluation Tier I: 1 Procedure PT Treatments $Gait  Training: 8-22 mins   PT G Codes:          [name] [name],[name] [name] 03/17/2014, 3:10 PM Zenovia Jarred, PT, DPT 03/17/2014 Pager: [phone]

## 2014-03-18 LAB — BASIC METABOLIC PANEL
ANION GAP: 16 — AB (ref 5–15)
BUN: 15 mg/dL (ref 6–23)
CHLORIDE: 98 meq/L (ref 96–112)
CO2: 20 mEq/L (ref 19–32)
Calcium: 9.4 mg/dL (ref 8.4–10.5)
Creatinine, Ser: 0.83 mg/dL (ref 0.50–1.10)
GFR, EST NON AFRICAN AMERICAN: 83 mL/min — AB (ref >90)
Glucose, Bld: 93 mg/dL (ref 70–99)
POTASSIUM: 4 meq/L (ref 3.7–5.3)
SODIUM: 134 meq/L — AB (ref 137–147)

## 2014-03-18 LAB — GLUCOSE, CAPILLARY
GLUCOSE-CAPILLARY: 140 mg/dL — AB (ref 70–99)
GLUCOSE-CAPILLARY: 88 mg/dL (ref 70–99)
GLUCOSE-CAPILLARY: 164 mg/dL — AB (ref 70–99)
Glucose-Capillary: 150 mg/dL — ABNORMAL HIGH (ref 70–99)
Glucose-Capillary: 130 mg/dL — ABNORMAL HIGH (ref 70–99)

## 2014-03-18 NOTE — Progress Notes (Signed)
Patient ID: [name] [name], female   DOB: [birth year], 48 y.o.   MRN: [id number] [facility] PROGRESS NOTE  Henny Strauch QIO:[id number] DOB: 1965/10/05 DOA: 03/02/2014 PCP: No primary provider on file.  Brief narrative: 48 year old hispanic female (smoker) seen in ED 02/27/14 with shortness of breath, she was treated and subsequently released home. She presented to [facility] ED 03/02/2014 with respiratory distress, had respiratory and cardiac arrest status post 1 round of epi, CPR and subsequently intubated. Initially she was acidotic but has improved with ventilatory support. Patient self extubated 03/07/2014 and required re- ntubation. Finally extubated 03/13/2014. Patient was under PCCM care until and including 03/14/2014 and transferred to [facility] starting 03/15/2014. Hospital course is complicated with seems to be possible ischemic cardiomyopathy. Patient had a 2-D echo on 03/03/2014 which revealed ejection fraction of 30-35% with inferior and posterior/lateral and septal hypokinesis. Patient underwent left heart catheterization 03/16/2014 which showed nonischemic cardiomyopathy.   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 Patient required 1 round of epinephrine, CPR and was intubated. She self extubated 03/07/2014 but has required reintubation. She was finally extubated 03/13/2014. PCCM transferred care to [facility] starting 03/15/2014. Respiratory status remains stable.  Patient was treated adequately for pneumonia. Blood cultures are negative. Strep pneumonia and Legionella are negative.  Patient had Quantiferon Gold positive but negative AFB x 3 so airborne precaution stopped per infectious disease recommendations.  Chest x-ray on 03/15/2014 showed no appreciable change compared with prior  study 03/14/2014. There is a scarring and pleural thickening on the right, no frank airspace consolidation and no pneumothorax.   Active problems:  Acute encephalopathy  History of alcohol abuse. Pt mental status improving.  Alcohol level not obtained on admission.  Leukocytosis  Likely secondary to community-acquired pneumonia for which patient received antibiotics appropriately.  WBC count trending down. Acute decompensated systolic and diastolic CHF / ischemic cardiomyopathy  Patient was diuresed aggressively 9/12 through 9/15 per cardiology recommendations , had negative 8.8 L fluid balance. 2-D echo done 03/03/2014 showed ejection fraction 30-35%.  Left heart catheterization done 03/16/2014 and showed nonischemic cardiomyopathy  Continue Coreg 6.25 mg by mouth twice a day. ACEi on hold because blood pressure still soft. Colonic ileus  Improved with NG suction. Patient does not have NG tube at this time. Abdomen is not distended.  Heart healthy diet Acute renal failure  Likely secondary to cardiac and respiratory distress, Lasix.  Creatinine is within normal limits. DVT Prophylaxis  Heparin subcutaneous   Code Status: Full.  Family Communication: Family not at the bedside this morning.  Disposition Plan: D/C in next 24 hours provided BP remains good.   IV Access:   Peripheral IV Procedures and diagnostic studies:   Dg Chest Port 1 View 03/15/2014 No appreciable change from 1 day prior. Scarring and pleural thickening on the right. Mild left base atelectasis. No frank airspace consolidation. Heart prominent but stable. No pneumothorax.  Dg Chest Port 1 View 03/14/2014 Scarring on the right. Left base atelectatic change. Mild cardiac enlargement. No pneumothorax.  CT-PA 03/02/14 No large PE (exam limited), B LAD, B LL predominant infiltrates and consolidation, small B effusions  TTE 03/03/14 Regional LV hypokinesis and dilation, EF 30-35%, dilated LA,  03/10/14 CT head Negative   03/11/14 CT abdomen Consistent with colonic ileus, bilateral effusions atelectasis  Left heart catheterization 03/16/2014 -  nonischemic cardiomyopathy Medical Consultants:   Cardiology  Other Consultants:   Physical therapy  Anti-Infectives:   None    Manson Passey, MD  [facility] Pager [phone]  If 7PM-7AM, please contact night-coverage [email or web address] Password [email or web address] 03/18/2014, 4:26 PM   LOS: 16 days    HPI/Subjective: No acute overnight events.  Objective: Filed Vitals:   03/17/14 1629 03/17/14 2108 03/18/14 0500 03/18/14 1425  BP: 102/67 90/51  86/55  Pulse: 94 102  94  Temp: 99.5 F (37.5 C) 99.1 F (37.3 C)  98.6 F (37 C)  TempSrc: Oral Oral  Oral  Resp: Height:      Weight:   58.06 kg (128 lb)   SpO2: 99% 99%  100%    Intake/Output Summary (Last 24 hours) at 03/18/14 1626 Last data filed at 03/17/14 2300  Gross per 24 hour  Intake    600 ml  Output      0 ml  Net    600 ml    Exam:   General:  Pt is not in distress, sleeping this am  Cardiovascular: Regular rate and rhythm, S1/S2 appreciated   Respiratory: Clear to auscultation bilaterally, no rhonchi  Abdomen: Soft, non tender, non distended, bowel sounds present  Extremities: No edema, pulses DP and PT palpable bilaterally  Neuro: No focal neurologic deficits  Data Reviewed: Basic Metabolic Panel:  Recent Labs Lab 03/12/14 0450 03/13/14 0600 03/14/14 0530 03/15/14 0530 03/16/14 1825 03/17/14 0915 03/18/14 0849  NA 137 139 137 134*  --  133* 134*  K 3.9 3.7 4.3 4.0  --  3.5* 4.0  CL 94* 93* 95* 92*  --  97 98  CO2 --  22 20  GLUCOSE 85 87 92 108*  --  85 93  BUN 29* 28* 28* 26*  --  18 15  CREATININE 0.97 1.12* 0.94 0.90 0.83 0.85 0.83  CALCIUM 9.8 10.2 10.1 10.3  --  9.2 9.4  MG 2.4 2.6* 2.4 2.3  --   --   --   PHOS 4.0 5.3* 4.6 4.0  --   --   --    Liver Function Tests: No results found for this basename: AST, ALT, ALKPHOS, BILITOT, PROT,  ALBUMIN,  in the last 168 hours No results found for this basename: LIPASE, AMYLASE,  in the last 168 hours No results found for this basename: AMMONIA,  in the last 168 hours CBC:  Recent Labs Lab 03/12/14 0450 03/13/14 0600 03/14/14 0530 03/15/14 0530 03/16/14 1825 03/17/14 0915  WBC 17.1* 16.5* 16.5* 16.2* 14.9* 13.8*  NEUTROABS 13.3* 12.7*  --   --   --   --   HGB 9.4* 10.2* 10.2* 10.8* 10.3* 10.0*  HCT 28.1* 30.9* 30.9* 32.7* 30.5* 30.0*  MCV 89.5 89.8 89.6 91.1 88.9 88.5  PLT 513* 588* 648* 637* 608* 623*   Cardiac Enzymes: No results found for this basename: CKTOTAL, CKMB, CKMBINDEX, TROPONINI,  in the last 168 hours BNP: No components found with this basename: POCBNP,  CBG:  Recent Labs Lab 03/17/14 1149 03/17/14 1711 03/17/14 2105 03/18/14 0751 03/18/14 1116  GLUCAP 91 143* 140* 88 150*    No results found for this or any previous visit (from the past 240 hour(s)).   Scheduled Meds: . aspirin EC  81 mg Oral Daily  . carvedilol  6.25 mg Oral BID WC  . feeding supplement (ENSURE COMPLETE)  237 mL Oral TID WC  .  heparin  5,000 Units Subcutaneous 3 times per day  . insulin aspart  0-15 Units Subcutaneous TID WC  . pantoprazole  40 mg Oral Daily   Continuous Infusions: . sodium chloride 10 mL/hr at 03/14/14 1900

## 2014-03-18 NOTE — Progress Notes (Signed)
Patient Name: [name] [name] Date of Encounter: 03/18/2014     Active Problems:   Respiratory failure   CAP (community acquired pneumonia)   ARDS (adult respiratory distress syndrome)   Encephalopathy acute   Acute respiratory failure with hypoxia    SUBJECTIVE  Feels well. No chest pain or dyspnea.  CURRENT MEDS . antiseptic oral rinse  7 mL Mouth Rinse QID  . aspirin EC  81 mg Oral Daily  . carvedilol  6.25 mg Oral BID WC  . chlorhexidine  15 mL Mouth Rinse BID  . feeding supplement (ENSURE COMPLETE)  237 mL Oral TID WC  . heparin  5,000 Units Subcutaneous 3 times per day  . insulin aspart  0-15 Units Subcutaneous TID WC  . pantoprazole  40 mg Oral Daily    OBJECTIVE  Filed Vitals:   03/17/14 1206 03/17/14 1629 03/17/14 2108 03/18/14 0500  BP: 112/61 102/67 90/51   Pulse: 87 94 102   Temp: 98.5 F (36.9 C) 99.5 F (37.5 C) 99.1 F (37.3 C)   TempSrc: Oral Oral Oral   Resp: 14 20 18    Height:      Weight:    128 lb (58.06 kg)  SpO2: 99% 99% 99%     Intake/Output Summary (Last 24 hours) at 03/18/14 0846 Last data filed at 03/17/14 2300  Gross per 24 hour  Intake    720 ml  Output    150 ml  Net    570 ml   Filed Weights   03/16/14 0400 03/17/14 0500 03/18/14 0500  Weight: 123 lb 10.9 oz (56.1 kg) 127 lb 13.9 oz (58 kg) 128 lb (58.06 kg)    PHYSICAL EXAM  General: Pleasant, NAD. Neuro: Alert and oriented X 3. Moves all extremities spontaneously. Psych: Normal affect. HEENT:  Normal  Neck: Supple without bruits or JVD. Lungs:  Resp regular and unlabored, CTA. Heart: RRR no s3, s4, or murmurs. Abdomen: Soft, non-tender, non-distended, BS + x 4.  Extremities: No clubbing, cyanosis or edema. DP/PT/Radials 2+ and equal bilaterally.  Accessory Clinical Findings  CBC  Recent Labs  03/16/14 1825 03/17/14 0915  WBC 14.9* 13.8*  HGB 10.3* 10.0*  HCT 30.5* 30.0*  MCV 88.9 88.5  PLT 608* 623*   Basic Metabolic Panel  Recent Labs   16/10/96 1825 03/17/14 0915  NA  --  133*  K  --  3.5*  CL  --  97  CO2  --  22  GLUCOSE  --  85  BUN  --  18  CREATININE 0.83 0.85  CALCIUM  --  9.2   Liver Function Tests No results found for this basename: AST, ALT, ALKPHOS, BILITOT, PROT, ALBUMIN,  in the last 72 hours No results found for this basename: LIPASE, AMYLASE,  in the last 72 hours Cardiac Enzymes No results found for this basename: CKTOTAL, CKMB, CKMBINDEX, TROPONINI,  in the last 72 hours BNP No components found with this basename: POCBNP,  D-Dimer No results found for this basename: DDIMER,  in the last 72 hours Hemoglobin A1C No results found for this basename: HGBA1C,  in the last 72 hours Fasting Lipid Panel No results found for this basename: CHOL, HDL, LDLCALC, TRIG, CHOLHDL, LDLDIRECT,  in the last 72 hours Thyroid Function Tests No results found for this basename: TSH, T4TOTAL, FREET3, T3FREE, THYROIDAB,  in the last 72 hours  TELE  NSR 86/min  ECG    Radiology/Studies  Ct Abdomen Pelvis Wo Contrast  03/11/2014  CLINICAL DATA:  Patient on ventilator, abdominal distention  EXAM: CT ABDOMEN AND PELVIS WITHOUT CONTRAST  TECHNIQUE: Multidetector CT imaging of the abdomen and pelvis was performed following the standard protocol without IV contrast.  COMPARISON:  Abdominal radiograph 03/11/2014  FINDINGS: Oral contrast was given by NG tube. Oral contrast is seen within the stomach. Oral contrast has also progressed into small bowel and to a lesser degree through the colon with a small volume of oral contrast into the rectum. Rectum is distended and fluid-filled, with a diameter of 7.2 cm. There are air-fluid levels throughout the colon. Transverse colon is distended 2 6.7 cm. Cecum measures 7.6 cm. Small bowel is relatively decompressed. There is no evidence of free air.  Bladder is decompressed by Foley catheter. There are no abnormalities involving reproductive organs. There is trace free fluid in the  pelvis. Aorta is nondilated.  Given limited evaluation without IV contrast, liver and spleen are normal. Gallbladder appears normal. Pancreas is normal. Adrenal glands and kidneys are normal. NG tube extends into the stomach with tip in the region of the antrum.  There are no acute musculoskeletal findings. There is interstitial prominence in the visualized portions of the lung bases suggesting pulmonary edema. There is cardiac enlargement. There is a small right in a moderate left pleural effusion. Bilateral lower lobe consolidation dependently is likely related to atelectasis.  IMPRESSION: 1. Findings most consistent with colonic ileus 2. Findings consistent with pulmonary edema, pleural effusions, and bilateral dependent atelectasis.   Electronically Signed   By: Esperanza Heir M.D.   On: 03/11/2014 10:19   Dg Chest 2 View  02/27/2014   CLINICAL DATA:  Mid back pain  EXAM: CHEST  2 VIEW  COMPARISON:  None.  FINDINGS: The lungs are markedly abnormal bilaterally. Within the right lung, there is volume loss. Heterogeneous opacities are scattered throughout the lung with relative lucency in the upper lobe. This may represent cavitation. There is pleural thickening at the right apex. There is more confluent opacity in the posterior upper lobe, likely the right. Underlying mass at the right apex is not excluded.  Date heterogeneous opacities are present at the left base. There is irregular opacity extending into the left apex as well. These densities are predominantly linear.  IMPRESSION: Abnormal lungs as described. An inflammatory process or malignancy are not excluded. Comparison with prior studies would be helpful. If none are available, CT chest is recommended to exclude mass.   Electronically Signed   By: Maryclare Bean M.D.   On: 02/27/2014 12:08   Ct Head Wo Contrast  03/10/2014   CLINICAL DATA:  Encephalopathy, altered mental status.  EXAM: CT HEAD WITHOUT CONTRAST  TECHNIQUE: Contiguous axial images were  obtained from the base of the skull through the vertex without intravenous contrast.  COMPARISON:  None.  FINDINGS: The ventricles and sulci are normal. No intraparenchymal hemorrhage, mass effect nor midline shift. No acute large vascular territory infarcts.  No abnormal extra-axial fluid collections. Basal cisterns are patent.  No skull fracture. The included ocular globes and orbital contents are non-suspicious. The mastoid aircells and included paranasal sinuses are well-aerated. Life support lines seen on the scout view.  IMPRESSION: No acute intracranial process.  Normal noncontrast CT of the head.   Electronically Signed   By: Awilda Metro   On: 03/10/2014 01:01   Ct Chest W Contrast  02/27/2014   CLINICAL DATA:  Shortness of breath. Chest and back pain. Right apical opacity on chest radiograph.  EXAM: CT CHEST WITH CONTRAST  TECHNIQUE: Multidetector CT imaging of the chest was performed during intravenous contrast administration.  CONTRAST:  80mL OMNIPAQUE IOHEXOL 300 MG/ML  SOLN  COMPARISON:  None.  FINDINGS: Mediastinum/Hilar Regions: Mild soft tissue density seen in the right paratracheal region measuring 1.3 cm on image 13. Mild subcarinal lymphadenopathy also seen measuring 1.3 cm. No evidence of lymph node calcification.  Other Thoracic Lymphadenopathy:  None.  Lungs: Asymmetric pleural-parenchymal opacity in the right lung apex shows associated peripheral bronchiectasis, consistent with scarring. Other areas of scarring are also seen bilaterally involving the right lung greater than left.  Diffuse thickening of pulmonary interlobular septi also demonstrated with bibasilar predominance, and mild interstitial edema cannot be excluded. No evidence of pulmonary consolidation or discrete pulmonary mass. No evidence of central endobronchial obstruction.  Pleura: Tiny bilateral pleural effusions noted, left side greater than right.  Vascular/Cardiac: No thoracic aortic aneurysm or other significant  abnormality identified.  Musculoskeletal:  No suspicious bone lesions identified.  Other:  None.  IMPRESSION: Tiny bilateral pleural effusions and thickening of pulmonary interlobular septi in both lung bases. Although this could be due to chronic interstitial lung disease, mild edema or interstitial pneumonitis cannot be excluded. Continued chest radiographic followup is recommended.  Bilateral pleural- parenchymal scarring with associated traction bronchiectasis in right lung apex.  Mild mediastinal lymphadenopathy in the right paratracheal and subcarinal regions, which is nonspecific and may be reactive in etiology. Continued followup by CT is recommended in 3-6 months.   Electronically Signed   By: Myles Rosenthal M.D.   On: 02/27/2014 14:31   Ct Angio Chest Pe W/cm &/or Wo Cm  03/02/2014   CLINICAL DATA:  Respiratory and cardiac arrest. Evaluate for pulmonary embolism  EXAM: CT ANGIOGRAPHY CHEST WITH CONTRAST  TECHNIQUE: Multidetector CT imaging of the chest was performed using the standard protocol during bolus administration of intravenous contrast. Multiplanar CT image reconstructions and MIPs were obtained to evaluate the vascular anatomy.  CONTRAST:  OMNIPAQUE IOHEXOL 350 MG/ML SOLN  COMPARISON:  02/27/2014  FINDINGS: THORACIC INLET/BODY WALL:  Interval tracheal and esophageal intubation. The nasogastric tube continues into the stomach at least. Endotracheal tube ends 1 cm above the carina, deeper than on preceding chest x-ray.  There is symmetric ill-defined fluid within the visceral neck compartment, without definitive cause. This could be reactive to recent intubation or from a generalized edematous state. No fluid collection or subcutaneous emphysema.  MEDIASTINUM:  Cardiomegaly, mainly related to left ventricular enlargement. No pericardial effusion. No significant atherosclerotic change. No acute aortic findings.  Evaluation of the pulmonary arterial tree is limited at multiple levels due to  respiratory motion. In general the exam is diagnostic to the level of the proximal most segmental pulmonary arteries. No evidence of pulmonary embolism.  Enlargement of subcarinal and hilar lymph nodes which is symmetric and presumably reactive. Mediastinal node followup has been previously recommended.  LUNG WINDOWS:  Diffuse lung consolidation. There is a dependent predilection, although the consolidation is panlobar. No definitive debris within the central airways. The right upper lobe bronchi are distorted and narrowed, with chronic hyperlucency of the right upper lobe consistent with air trapping. The vessels in this region are likewise attenuated. Given posterior segment calcifications bilaterally, this is likely postinfectious scarring - possibly remote TB. Small bilateral pleural effusions without loculation or abnormal pleural enhancement.  UPPER ABDOMEN:  No acute findings.  OSSEOUS:  No acute findings.  Review of the MIP images confirms the above findings.  IMPRESSION:  1. Multi lobar pneumonia with small pleural effusions. The pattern and history of cardiac arrest implicates aspiration. 2. As permitted by moderate motion degradation, no evidence of pulmonary embolism. 3. Endotracheal tube lower than at time of placement, now ending 1 cm above the carina. 4. Left ventricular enlargement.  No definite pulmonary edema. 5. Right upper lobe scarring with air trapping, likely from remote infection.   Electronically Signed   By: Tiburcio Pea M.D.   On: 03/02/2014 13:12   Dg Chest Port 1 View  03/15/2014   CLINICAL DATA:  Pneumonia  EXAM: PORTABLE CHEST - 1 VIEW  COMPARISON:  March 14, 2014  FINDINGS: Central catheter tip is in the superior vena cava. No pneumothorax. Areas of scarring and apical pleural thickening on the right remain stable. There is mild atelectatic change in the left base, stable. There is no new opacity. There is no frank consolidation. Heart is mildly enlarged with pulmonary  vascularity within normal limits, stable. No appreciable adenopathy.  IMPRESSION: No appreciable change from 1 day prior. Scarring and pleural thickening on the right. Mild left base atelectasis. No frank airspace consolidation. Heart prominent but stable. No pneumothorax.   Electronically Signed   By: Bretta Bang M.D.   On: 03/15/2014 08:04   Dg Chest Port 1 View  03/14/2014   CLINICAL DATA:  Atelectasis  EXAM: PORTABLE CHEST - 1 VIEW  COMPARISON:  March 13, 2014  FINDINGS: Endotracheal and nasogastric tube have been removed. No pneumothorax. Central catheter tip is in the superior vena cava. No pneumothorax. There is asymmetric apical pleural thickening on the right with scattered scarring in the right lung, stable. There is atelectatic change in the left base. There is no appreciable consolidation.  Heart is mildly enlarged with pulmonary vascularity within normal limits. No adenopathy.  IMPRESSION: Scarring on the right. Left base atelectatic change. Mild cardiac enlargement. No pneumothorax.   Electronically Signed   By: Bretta Bang M.D.   On: 03/14/2014 07:08   Dg Chest Port 1 View  03/13/2014   CLINICAL DATA:  Assess endotracheal tube position.  EXAM: PORTABLE CHEST - 1 VIEW  COMPARISON:  Portable chest x-ray of March 12, 2014  FINDINGS: The lungs are reasonably well inflated. The retrocardiac density on the left is slightly increased. The left hemidiaphragm remains partially obscured. The cardiac silhouette is normal. The pulmonary vascularity is not engorged.  The endotracheal tube tip lies 3.1 cm above the crotch of the carina. The left internal jugular venous catheter tip lies in the proximal SVC. The esophagogastric tube tip projects below the inferior margin of the image.  IMPRESSION: There has not been significant interval change in the appearance of the chest though mild worsening of left lower lobe atelectasis is not excluded. The support tubes and lines are in appropriate  position.   Electronically Signed   By: David  [place]   On: 03/13/2014 07:13   Dg Chest Port 1 View  03/12/2014   CLINICAL DATA:  Evaluate endotracheal tube placement.  EXAM: PORTABLE CHEST - 1 VIEW  COMPARISON:  Chest x-ray 03/11/2014.  FINDINGS: An endotracheal tube is in place with tip 3.9 cm above the carina. There is a left-sided internal jugular central venous catheter with tip terminating in the mid superior vena cava. A nasogastric tube is seen extending into the stomach, however, the tip of the nasogastric tube extends below the lower margin of the image. Lung volumes remain slightly low, but have improved compared to the recent prior examination.  Again noted is a background of chronic increased interstitial markings throughout the lungs bilaterally with hyperlucency and architectural distortion in the right upper lobe, similar to multiple prior examinations. No definite acute consolidative airspace disease. No pleural effusions. No evidence of pulmonary edema. Mild cardiomegaly is unchanged. The patient is rotated to the right on today's exam, resulting in distortion of the mediastinal contours and reduced diagnostic sensitivity and specificity for mediastinal pathology.  IMPRESSION: 1. Support apparatus, as above. 2. Chronic changes in the lungs redemonstrated, suggestive of extensive areas of scarring related to prior episodes of infection or inflammation (e.g., ARDS).   Electronically Signed   By: Trudie Reed M.D.   On: 03/12/2014 07:20   Dg Chest Port 1 View  03/11/2014   CLINICAL DATA:  Evaluate endotracheal tube  EXAM: PORTABLE CHEST - 1 VIEW  COMPARISON:  03/10/2014  FINDINGS: Endotracheal tube tip is identified 1.7 cm above the carina. No change left central line. Stable cardiac enlargement exaggerated by limited inspiratory effect. There is fibrotic change in the right middle lobe. There is stable mild diffuse interstitial change.  There is gaseous distention of the stomach. NG tube is  unchanged, again seen passing into the stomach.  IMPRESSION: No significant interval change   Electronically Signed   By: Esperanza Heir M.D.   On: 03/11/2014 07:20   Dg Chest Port 1 View  03/10/2014   CLINICAL DATA:  Hypoxia  EXAM: PORTABLE CHEST - 1 VIEW  COMPARISON:  March 09, 2014  FINDINGS: Endotracheal tube tip is 2.0 cm above the carina. Nasogastric tube tip and side port are in the stomach. Central catheter tip is in the superior vena cava. No pneumothorax. There is left base consolidation. There is scarring in the right mid and lower lung zones. There is also right apical pleural thickening which is stable. Heart is mildly enlarged with pulmonary vascularity within normal limits. No adenopathy.  IMPRESSION: Tube and catheter positions as described without pneumothorax. Persistent left lower lobe consolidation. Areas of scarring on the right appear stable.   Electronically Signed   By: Bretta Bang M.D.   On: 03/10/2014 12:08   Dg Chest Port 1 View  03/09/2014   CLINICAL DATA:  Decreased oxygen saturation  EXAM: PORTABLE CHEST - 1 VIEW  COMPARISON:  03/08/2014  FINDINGS: An endotracheal tube is again identified with the tip 10 mm above the carina. It is stable in appearance. A left central venous line is again noted and stable. Nasogastric catheter is coiled within the stomach. Bilateral nipple shadows are seen. Bilateral infiltrates are noted with improved aeration particularly in the left lung base.  IMPRESSION: Mild infiltrates are again identified bilaterally. Improved aeration is noted.  The endotracheal tube still lies low within the trachea and could be withdrawn 1-2 cm. No new focal abnormality is seen.   Electronically Signed   By: Alcide Clever M.D.   On: 03/09/2014 20:22   Dg Chest Port 1 View  03/08/2014   CLINICAL DATA:  Check endotracheal tube position  EXAM: PORTABLE CHEST - 1 VIEW  COMPARISON:  03/07/2014  FINDINGS: Endotracheal tube now lies 1 cm above the carina and is  relatively stable from the prior exam. The cardiac shadow is stable. A left jugular central line and nasogastric catheter are again seen. Bilateral basilar infiltrates are seen as well as apical changes on the right. The overall appearance is stable.  IMPRESSION: Stable bilateral infiltrates.  Tubes and lines as described above. Again the endotracheal tube could  be withdrawn 1-2 cm.   Electronically Signed   By: Alcide Clever M.D.   On: 03/08/2014 07:36   Dg Chest Port 1 View  03/07/2014   CLINICAL DATA:  Cough.  Intubation.  EXAM: PORTABLE CHEST - 1 VIEW  COMPARISON:  03/07/2014  FINDINGS: Endotracheal tube tip is localized at the carina. Retraction about 2 cm is recommended. Positioning of the tube is unchanged since previous studies. Enteric tube tip is off the field of view but below the left hemidiaphragm. Left central venous catheter is projected over the mid SVC region. Cardiac enlargement with pulmonary vascular congestion and bilateral parenchymal infiltrates. Findings likely due to edema although pneumonia or ARDS could also have this appearance. Bilateral pleural effusions. No significant change since previous study.  IMPRESSION: Cardiac enlargement with pulmonary vascular congestion and diffuse pulmonary infiltration. Bilateral pleural effusions. Appliances are unchanged in position. The endotracheal tube the tip is again noted at the carina and retraction of about 2 cm is again recommended.   Electronically Signed   By: Burman Nieves M.D.   On: 03/07/2014 22:07   Dg Chest Port 1 View  03/07/2014   CLINICAL DATA:  Intubated patient.  EXAM: PORTABLE CHEST - 1 VIEW  COMPARISON:  03/06/2014  FINDINGS: Endotracheal tube tip lies at the chronic. It appears unchanged from the previous exam.  Hazy bilateral airspace lung opacities, most confluent in the right perihilar and lower lung zone, have increased, some of which is likely due to lower lung volumes on the current exam. There are small bilateral  pleural effusions. Scarring and pleural thickening at the apices, greater on the right, is stable. No pneumothorax.  Left internal jugular central venous line tip is well positioned in the lower superior vena cava. Nasogastric tube tip passes below the diaphragm and below the included field of view.  IMPRESSION: 1. Tip of the endotracheal tube projects at the carina. Consider retracting 1-2 cm for more optimal positioning. 2. Lung aeration appears worsened when compared to the previous day's study although the difference may be due to lower lung volumes on the current exam.   Electronically Signed   By: Amie Portland M.D.   On: 03/07/2014 07:32   Dg Chest Port 1 View  03/06/2014   CLINICAL DATA:  Respiratory failure.  EXAM: PORTABLE CHEST - 1 VIEW  COMPARISON:  03/04/2014.  FINDINGS: Endotracheal tube tip is at the level of carina. Recommend retracting by 2-3 cm to avoid mainstem bronchus intubation with change of patients head position.  Nasogastric tube courses below the diaphragm. Tip is not included on the present exam.  Left central line tip projects at the level of the mid to distal superior vena cava. No gross pneumothorax.  Right apical parenchymal changes similar to prior exam.  Interval improved aeration mid to lower lung zones.  Persistent pulmonary vascular congestion/ pulmonary edema.  Retrocardiac opacity may represent atelectasis with pleural fluid. Infiltrate not excluded. Recommend followup until clearance.  Cardiomegaly.  IMPRESSION: Endotracheal tube tip needs to be repositioned as discussed above.  Interval slight improved aeration mid to lower lung zones. Please see above discussion.  These results were called by telephone at the time of interpretation on 03/06/2014 at 8:05 am to [facility] patient's nurse, who verbally acknowledged these results.   Electronically Signed   By: Bridgett Larsson M.D.   On: 03/06/2014 08:08   Dg Chest Port 1 View  03/04/2014   CLINICAL DATA:  ETT placement  EXAM: PORTABLE  CHEST - 1  VIEW  COMPARISON:  03/03/2014  FINDINGS: Endotracheal tube terminates 7 mm above the carina.  Multifocal patchy opacities in the lungs bilaterally, worrisome for multifocal pneumonia, less likely moderate interstitial edema. Small bilateral pleural effusions. No pneumothorax.  The heart is top-normal in size.  Left IJ venous catheter terminates at the cavoatrial junction. Enteric tube courses below the diaphragm.  IMPRESSION: Endotracheal tube terminates 7 mm above the carina.  Multifocal patchy opacities in the lungs bilaterally, worrisome for multifocal pneumonia.  Small bilateral pleural effusions.   Electronically Signed   By: Charline Bills M.D.   On: 03/04/2014 07:11   Dg Chest Port 1 View  03/03/2014   CLINICAL DATA:  Endotracheal tube patient, tube placed yesterday, evaluate lungs  EXAM: PORTABLE CHEST - 1 VIEW  COMPARISON:  03/02/2014  FINDINGS: No change in position of endotracheal tube with tip 1 cm above the carina. Left subclavian central line and NG tube unchanged.  Extensive airspace opacity over the right middle and lower lung zones, stable allowing for differences in technique. Diffuse mixed alveolar and interstitial opacity throughout the left lung, most consolidated in the lower lobe, also stable.  IMPRESSION: No change when compared to radiograph performed yesterday.   Electronically Signed   By: Esperanza Heir M.D.   On: 03/03/2014 09:20   Dg Chest Port 1 View  03/02/2014   CLINICAL DATA:  Central line placement  EXAM: PORTABLE CHEST - 1 VIEW  COMPARISON:  03/02/2014  FINDINGS: Endotracheal tube 1 cm above the carina.  Left jugular catheter placement with the tip in the SVC. No pneumothorax. NG tube in the stomach  Diffuse bilateral airspace disease unchanged. Small bilateral effusions unchanged. Question pneumonia versus heart failure  IMPRESSION: Endotracheal tube 1 cm above the carina  Sent factors central line placement  Diffuse bilateral airspace disease and bilateral  effusions unchanged.   Electronically Signed   By: Marlan Palau M.D.   On: 03/02/2014 15:45   Dg Chest Portable 1 View  03/02/2014   CLINICAL DATA:  Cardiac and respiratory arrest  EXAM: PORTABLE CHEST - 1 VIEW  COMPARISON:  Portable chest x-ray of March 02, 2014 at 0952 hr.  FINDINGS: The patient has undergone interval intubation of the trachea. The tip of the tube lies approximately 3 0.6 cm above the crotch of the carina. The esophagogastric tube tip projects off the inferior margin of the image. There are external pacing and defibrillation pannus present. The lungs are adequately inflated but exhibit diffusely increased interstitial markings more conspicuous than earlier today. There is no pneumothorax. A small pleural effusion on the right is suspected but is unchanged.  IMPRESSION: The endotracheal and esophagogastric tubes appear to be in appropriate position. There is diffuse interstitial edema more conspicuous than on the earlier study.   Electronically Signed   By: David  [place]   On: 03/02/2014 11:09   Dg Chest Portable 1 View  03/02/2014   CLINICAL DATA:  Short of breath  EXAM: PORTABLE CHEST - 1 VIEW  COMPARISON:  02/27/2014  FINDINGS: Cardiac enlargement with progression of diffuse bilateral airspace disease, possible pneumonia versus infection. Right pleural effusion slightly larger. No left effusion. Bibasilar atelectasis has progressed.  IMPRESSION: Worsening bilateral airspace disease. This may represent pulmonary edema versus infection.   Electronically Signed   By: Marlan Palau M.D.   On: 03/02/2014 10:16   Dg Abd Portable 1v  03/11/2014   CLINICAL DATA:  Abdominal distention.  EXAM: PORTABLE ABDOMEN - 1 VIEW  COMPARISON:  Abdominal radiograph performed 03/10/2014  FINDINGS: There is diffuse dilatation of small and large bowel loops throughout the abdomen and pelvis, and of the stomach, compatible with ileus. This is similar in appearance to the prior study. The patient's enteric  tube is noted ending at the antrum of the stomach. No free intra-abdominal air is seen, though evaluation for free air is limited on a single supine view.  The visualized osseous structures are within normal limits; the sacroiliac joints are unremarkable in appearance. Mild bibasilar atelectasis is noted.  IMPRESSION: Relatively stable appearance to diffuse ileus. No free intra-abdominal air seen.   Electronically Signed   By: Roanna Raider M.D.   On: 03/11/2014 06:12   Dg Abd Portable 1v  03/10/2014   CLINICAL DATA:  Abdominal distention  EXAM: PORTABLE ABDOMEN - 1 VIEW  COMPARISON:  None.  FINDINGS: There is generalized bowel dilatation. A small amount of air is present in the rectum. No free air is seen on this supine examination. Nasogastric tube tip and side port are in the body of the stomach. There are phleboliths in the pelvis.  IMPRESSION: The bowel gas pattern is most consistent with ileus, although a degree of distal obstruction cannot be entirely excluded. No free air is seen on this supine examination. Nasogastric tube tip and side port in stomach.   Electronically Signed   By: Bretta Bang M.D.   On: 03/10/2014 12:07    ASSESSMENT AND PLAN Nonischemic cardiomyopathy.  BP is still soft at times. Continue current cardiac meds. No ACEi yet.  Signed, Cassell Clement MD

## 2014-03-19 LAB — GLUCOSE, CAPILLARY: GLUCOSE-CAPILLARY: 106 mg/dL — AB (ref 70–99)

## 2014-03-19 MED ORDER — CARVEDILOL 6.25 MG PO TABS: 6.2500 mg | ORAL_TABLET | Freq: Two times a day (BID) | ORAL | Status: DC

## 2014-03-19 MED ORDER — ASPIRIN 81 MG PO TBEC: 81.0000 mg | DELAYED_RELEASE_TABLET | Freq: Every day | ORAL | Status: AC

## 2014-03-19 MED ORDER — PANTOPRAZOLE SODIUM 40 MG PO TBEC: 40.0000 mg | DELAYED_RELEASE_TABLET | Freq: Every day | ORAL | Status: AC

## 2014-03-19 MED ORDER — ENSURE COMPLETE PO LIQD: 237.0000 mL | Freq: Three times a day (TID) | ORAL | Status: AC

## 2014-03-19 NOTE — Discharge Instructions (Signed)
Cuidados de la zona radial  (Radial Site Care) Sga estas instrucciones durante las prximas semanas. Estas indicaciones le proporcionan informacin general acerca de cmo deber cuidarse despus del procedimiento. El mdico tambin podr darle instrucciones especficas. El tratamiento ha sido planificado segn las prcticas mdicas actuales, pero en algunos casos pueden ocurrir problemas. Comunquese con el mdico si tiene algn problema o tiene preguntas despus del procedimiento.  INSTRUCCIONES PARA EL CUIDADO EN EL HOGAR   Podr ducharse al da siguiente de Surveyor, quantity.Remove retire el (vendaje) y lave suavemente la zona con agua y jabn comn.Seque con golpecitos suaves.  No se aplique polvos ni lociones.  No sumerja en agua la zona afectada durante 3 a 5 das.  Inspeccinela al [employer] veces por da.  No flexione ni doble el brazo afectado por 24 horas.  No levante objetos de ms de 5 libras (2.3 kg) durante los 5 [employer] al procedimiento.  No conduzca el automvil por sus propios medios si le dan el alta el mismo da. Pdale a otra persona que lo lleve.  Despus de 24 horas, podr conducir, excepto que el mdico no lo aconseje.  No opere maquinarias ni herramientas elctricas durante 24 horas.  Un adulto responsable debe permanecer con usted durante las primeras 24 horas despus de llegar a su casa. Qu puede esperar:  Algn hematoma que generalmente desaparece despus de 1  2 semanas.  Acumulacin de [employer] tejidos (hematoma) que puede ser doloroso cuando se lo toca. En 1  2 semanas debe disminuir el tamao y la inflamacin. SOLICITE ATENCIN MDICA DE INMEDIATO SI:   Siente un dolor que no es habitual en la zona.  Observa que el sitio del radio est hinchado o le duele.  Tiene un drenaje (que no es una pequea cantidad de sangre en el vendaje).  Comienza a sentir escalofros.  Tiene fiebre o sntomas que persisten durante ms de 72  horas.  Tiene fiebre y los sntomas [address].  El brazo est plido, fro, adormecido o siente hormigueos.  Sangra mucho en el sitio. Haga presin Medical laboratory scientific officer. Document Released: 10/11/2010 Document Revised: 09/08/2011 [facility] Patient Information 2015 [place], [place]. This information is not intended to replace advice given to you by your health care provider. Make sure you discuss any questions you have with your health care provider.

## 2014-03-19 NOTE — Progress Notes (Signed)
D/C instructions and teach back used with phone interpreter.  Pt able to verbally explain medications and follow up appointments.  Pt stated that if she can get appointment quickly she will follow up locally and if not she will be returning to [place].  Pt stated that she understood d/c instructions.  Pt d/c home. Breshae Belcher A

## 2014-03-19 NOTE — Progress Notes (Signed)
Patient Name: [name] [name] Date of Encounter: 03/19/2014     Active Problems:   Respiratory failure   CAP (community acquired pneumonia)   ARDS (adult respiratory distress syndrome)   Encephalopathy acute   Acute respiratory failure with hypoxia    SUBJECTIVE  Feels well. No chest pain or dyspnea. Anxious to go home.  CURRENT MEDS . antiseptic oral rinse  7 mL Mouth Rinse QID  . aspirin EC  81 mg Oral Daily  . carvedilol  6.25 mg Oral BID WC  . chlorhexidine  15 mL Mouth Rinse BID  . feeding supplement (ENSURE COMPLETE)  237 mL Oral TID WC  . heparin  5,000 Units Subcutaneous 3 times per day  . insulin aspart  0-15 Units Subcutaneous TID WC  . pantoprazole  40 mg Oral Daily    OBJECTIVE  Filed Vitals:   03/18/14 1600 03/18/14 1804 03/18/14 2209 03/19/14 0515  BP: 90/60 96/62 99/62  101/63  Pulse:   91 87  Temp:   98.4 F (36.9 C) 98.3 F (36.8 C)  TempSrc:   Oral Oral  Resp:   20 20  Height:      Weight:    119 lb 6.4 oz (54.159 kg)  SpO2:   99% 100%   No intake or output data in the 24 hours ending 03/19/14 0819 Filed Weights   03/17/14 0500 03/18/14 0500 03/19/14 0515  Weight: 127 lb 13.9 oz (58 kg) 128 lb (58.06 kg) 119 lb 6.4 oz (54.159 kg)    PHYSICAL EXAM  General: Pleasant, NAD. Neuro: Alert and oriented X 3. Moves all extremities spontaneously. Psych: Normal affect. HEENT:  Normal  Neck: Supple without bruits or JVD. Lungs:  Resp regular and unlabored, CTA. Heart: RRR no s3, s4, or murmurs. Abdomen: Soft, non-tender, non-distended, BS + x 4.  Extremities: No clubbing, cyanosis or edema. DP/PT/Radials 2+ and equal bilaterally.  Accessory Clinical Findings  CBC  Recent Labs  03/16/14 1825 03/17/14 0915  WBC 14.9* 13.8*  HGB 10.3* 10.0*  HCT 30.5* 30.0*  MCV 88.9 88.5  PLT 608* 623*   Basic Metabolic Panel  Recent Labs  03/17/14 0915 03/18/14 0849  NA 133* 134*  K 3.5* 4.0  CL 97 98  CO2 22 20  GLUCOSE 85 93  BUN 18 15    CREATININE 0.85 0.83  CALCIUM 9.2 9.4   Liver Function Tests No results found for this basename: AST, ALT, ALKPHOS, BILITOT, PROT, ALBUMIN,  in the last 72 hours No results found for this basename: LIPASE, AMYLASE,  in the last 72 hours Cardiac Enzymes No results found for this basename: CKTOTAL, CKMB, CKMBINDEX, TROPONINI,  in the last 72 hours BNP No components found with this basename: POCBNP,  D-Dimer No results found for this basename: DDIMER,  in the last 72 hours Hemoglobin A1C No results found for this basename: HGBA1C,  in the last 72 hours Fasting Lipid Panel No results found for this basename: CHOL, HDL, LDLCALC, TRIG, CHOLHDL, LDLDIRECT,  in the last 72 hours Thyroid Function Tests No results found for this basename: TSH, T4TOTAL, FREET3, T3FREE, THYROIDAB,  in the last 72 hours  TELE  NSR  ECG    Radiology/Studies  Ct Abdomen Pelvis Wo Contrast  03/11/2014   CLINICAL DATA:  Patient on ventilator, abdominal distention  EXAM: CT ABDOMEN AND PELVIS WITHOUT CONTRAST  TECHNIQUE: Multidetector CT imaging of the abdomen and pelvis was performed following the standard protocol without IV contrast.  COMPARISON:  Abdominal radiograph 03/11/2014  FINDINGS: Oral contrast was given by NG tube. Oral contrast is seen within the stomach. Oral contrast has also progressed into small bowel and to a lesser degree through the colon with a small volume of oral contrast into the rectum. Rectum is distended and fluid-filled, with a diameter of 7.2 cm. There are air-fluid levels throughout the colon. Transverse colon is distended 2 6.7 cm. Cecum measures 7.6 cm. Small bowel is relatively decompressed. There is no evidence of free air.  Bladder is decompressed by Foley catheter. There are no abnormalities involving reproductive organs. There is trace free fluid in the pelvis. Aorta is nondilated.  Given limited evaluation without IV contrast, liver and spleen are normal. Gallbladder appears  normal. Pancreas is normal. Adrenal glands and kidneys are normal. NG tube extends into the stomach with tip in the region of the antrum.  There are no acute musculoskeletal findings. There is interstitial prominence in the visualized portions of the lung bases suggesting pulmonary edema. There is cardiac enlargement. There is a small right in a moderate left pleural effusion. Bilateral lower lobe consolidation dependently is likely related to atelectasis.  IMPRESSION: 1. Findings most consistent with colonic ileus 2. Findings consistent with pulmonary edema, pleural effusions, and bilateral dependent atelectasis.   Electronically Signed   By: Esperanza Heir M.D.   On: 03/11/2014 10:19   Dg Chest 2 View  02/27/2014   CLINICAL DATA:  Mid back pain  EXAM: CHEST  2 VIEW  COMPARISON:  None.  FINDINGS: The lungs are markedly abnormal bilaterally. Within the right lung, there is volume loss. Heterogeneous opacities are scattered throughout the lung with relative lucency in the upper lobe. This may represent cavitation. There is pleural thickening at the right apex. There is more confluent opacity in the posterior upper lobe, likely the right. Underlying mass at the right apex is not excluded.  Date heterogeneous opacities are present at the left base. There is irregular opacity extending into the left apex as well. These densities are predominantly linear.  IMPRESSION: Abnormal lungs as described. An inflammatory process or malignancy are not excluded. Comparison with prior studies would be helpful. If none are available, CT chest is recommended to exclude mass.   Electronically Signed   By: Maryclare Bean M.D.   On: 02/27/2014 12:08   Ct Head Wo Contrast  03/10/2014   CLINICAL DATA:  Encephalopathy, altered mental status.  EXAM: CT HEAD WITHOUT CONTRAST  TECHNIQUE: Contiguous axial images were obtained from the base of the skull through the vertex without intravenous contrast.  COMPARISON:  None.  FINDINGS: The  ventricles and sulci are normal. No intraparenchymal hemorrhage, mass effect nor midline shift. No acute large vascular territory infarcts.  No abnormal extra-axial fluid collections. Basal cisterns are patent.  No skull fracture. The included ocular globes and orbital contents are non-suspicious. The mastoid aircells and included paranasal sinuses are well-aerated. Life support lines seen on the scout view.  IMPRESSION: No acute intracranial process.  Normal noncontrast CT of the head.   Electronically Signed   By: Awilda Metro   On: 03/10/2014 01:01   Ct Chest W Contrast  02/27/2014   CLINICAL DATA:  Shortness of breath. Chest and back pain. Right apical opacity on chest radiograph.  EXAM: CT CHEST WITH CONTRAST  TECHNIQUE: Multidetector CT imaging of the chest was performed during intravenous contrast administration.  CONTRAST:  48mL OMNIPAQUE IOHEXOL 300 MG/ML  SOLN  COMPARISON:  None.  FINDINGS: Mediastinum/Hilar Regions: Mild soft tissue density seen  in the right paratracheal region measuring 1.3 cm on image 13. Mild subcarinal lymphadenopathy also seen measuring 1.3 cm. No evidence of lymph node calcification.  Other Thoracic Lymphadenopathy:  None.  Lungs: Asymmetric pleural-parenchymal opacity in the right lung apex shows associated peripheral bronchiectasis, consistent with scarring. Other areas of scarring are also seen bilaterally involving the right lung greater than left.  Diffuse thickening of pulmonary interlobular septi also demonstrated with bibasilar predominance, and mild interstitial edema cannot be excluded. No evidence of pulmonary consolidation or discrete pulmonary mass. No evidence of central endobronchial obstruction.  Pleura: Tiny bilateral pleural effusions noted, left side greater than right.  Vascular/Cardiac: No thoracic aortic aneurysm or other significant abnormality identified.  Musculoskeletal:  No suspicious bone lesions identified.  Other:  None.  IMPRESSION: Tiny  bilateral pleural effusions and thickening of pulmonary interlobular septi in both lung bases. Although this could be due to chronic interstitial lung disease, mild edema or interstitial pneumonitis cannot be excluded. Continued chest radiographic followup is recommended.  Bilateral pleural- parenchymal scarring with associated traction bronchiectasis in right lung apex.  Mild mediastinal lymphadenopathy in the right paratracheal and subcarinal regions, which is nonspecific and may be reactive in etiology. Continued followup by CT is recommended in 3-6 months.   Electronically Signed   By: Myles Rosenthal M.D.   On: 02/27/2014 14:31   Ct Angio Chest Pe W/cm &/or Wo Cm  03/02/2014   CLINICAL DATA:  Respiratory and cardiac arrest. Evaluate for pulmonary embolism  EXAM: CT ANGIOGRAPHY CHEST WITH CONTRAST  TECHNIQUE: Multidetector CT imaging of the chest was performed using the standard protocol during bolus administration of intravenous contrast. Multiplanar CT image reconstructions and MIPs were obtained to evaluate the vascular anatomy.  CONTRAST:  OMNIPAQUE IOHEXOL 350 MG/ML SOLN  COMPARISON:  02/27/2014  FINDINGS: THORACIC INLET/BODY WALL:  Interval tracheal and esophageal intubation. The nasogastric tube continues into the stomach at least. Endotracheal tube ends 1 cm above the carina, deeper than on preceding chest x-ray.  There is symmetric ill-defined fluid within the visceral neck compartment, without definitive cause. This could be reactive to recent intubation or from a generalized edematous state. No fluid collection or subcutaneous emphysema.  MEDIASTINUM:  Cardiomegaly, mainly related to left ventricular enlargement. No pericardial effusion. No significant atherosclerotic change. No acute aortic findings.  Evaluation of the pulmonary arterial tree is limited at multiple levels due to respiratory motion. In general the exam is diagnostic to the level of the proximal most segmental pulmonary arteries.  No evidence of pulmonary embolism.  Enlargement of subcarinal and hilar lymph nodes which is symmetric and presumably reactive. Mediastinal node followup has been previously recommended.  LUNG WINDOWS:  Diffuse lung consolidation. There is a dependent predilection, although the consolidation is panlobar. No definitive debris within the central airways. The right upper lobe bronchi are distorted and narrowed, with chronic hyperlucency of the right upper lobe consistent with air trapping. The vessels in this region are likewise attenuated. Given posterior segment calcifications bilaterally, this is likely postinfectious scarring - possibly remote TB. Small bilateral pleural effusions without loculation or abnormal pleural enhancement.  UPPER ABDOMEN:  No acute findings.  OSSEOUS:  No acute findings.  Review of the MIP images confirms the above findings.  IMPRESSION: 1. Multi lobar pneumonia with small pleural effusions. The pattern and history of cardiac arrest implicates aspiration. 2. As permitted by moderate motion degradation, no evidence of pulmonary embolism. 3. Endotracheal tube lower than at time of placement, now ending 1 cm  above the carina. 4. Left ventricular enlargement.  No definite pulmonary edema. 5. Right upper lobe scarring with air trapping, likely from remote infection.   Electronically Signed   By: Tiburcio Pea M.D.   On: 03/02/2014 13:12   Dg Chest Port 1 View  03/15/2014   CLINICAL DATA:  Pneumonia  EXAM: PORTABLE CHEST - 1 VIEW  COMPARISON:  March 14, 2014  FINDINGS: Central catheter tip is in the superior vena cava. No pneumothorax. Areas of scarring and apical pleural thickening on the right remain stable. There is mild atelectatic change in the left base, stable. There is no new opacity. There is no frank consolidation. Heart is mildly enlarged with pulmonary vascularity within normal limits, stable. No appreciable adenopathy.  IMPRESSION: No appreciable change from 1 day prior.  Scarring and pleural thickening on the right. Mild left base atelectasis. No frank airspace consolidation. Heart prominent but stable. No pneumothorax.   Electronically Signed   By: Bretta Bang M.D.   On: 03/15/2014 08:04   Dg Chest Port 1 View  03/14/2014   CLINICAL DATA:  Atelectasis  EXAM: PORTABLE CHEST - 1 VIEW  COMPARISON:  March 13, 2014  FINDINGS: Endotracheal and nasogastric tube have been removed. No pneumothorax. Central catheter tip is in the superior vena cava. No pneumothorax. There is asymmetric apical pleural thickening on the right with scattered scarring in the right lung, stable. There is atelectatic change in the left base. There is no appreciable consolidation.  Heart is mildly enlarged with pulmonary vascularity within normal limits. No adenopathy.  IMPRESSION: Scarring on the right. Left base atelectatic change. Mild cardiac enlargement. No pneumothorax.   Electronically Signed   By: Bretta Bang M.D.   On: 03/14/2014 07:08   Dg Chest Port 1 View  03/13/2014   CLINICAL DATA:  Assess endotracheal tube position.  EXAM: PORTABLE CHEST - 1 VIEW  COMPARISON:  Portable chest x-ray of March 12, 2014  FINDINGS: The lungs are reasonably well inflated. The retrocardiac density on the left is slightly increased. The left hemidiaphragm remains partially obscured. The cardiac silhouette is normal. The pulmonary vascularity is not engorged.  The endotracheal tube tip lies 3.1 cm above the crotch of the carina. The left internal jugular venous catheter tip lies in the proximal SVC. The esophagogastric tube tip projects below the inferior margin of the image.  IMPRESSION: There has not been significant interval change in the appearance of the chest though mild worsening of left lower lobe atelectasis is not excluded. The support tubes and lines are in appropriate position.   Electronically Signed   By: David  [place]   On: 03/13/2014 07:13   Dg Chest Port 1 View  03/12/2014    CLINICAL DATA:  Evaluate endotracheal tube placement.  EXAM: PORTABLE CHEST - 1 VIEW  COMPARISON:  Chest x-ray 03/11/2014.  FINDINGS: An endotracheal tube is in place with tip 3.9 cm above the carina. There is a left-sided internal jugular central venous catheter with tip terminating in the mid superior vena cava. A nasogastric tube is seen extending into the stomach, however, the tip of the nasogastric tube extends below the lower margin of the image. Lung volumes remain slightly low, but have improved compared to the recent prior examination. Again noted is a background of chronic increased interstitial markings throughout the lungs bilaterally with hyperlucency and architectural distortion in the right upper lobe, similar to multiple prior examinations. No definite acute consolidative airspace disease. No pleural effusions. No evidence of pulmonary  edema. Mild cardiomegaly is unchanged. The patient is rotated to the right on today's exam, resulting in distortion of the mediastinal contours and reduced diagnostic sensitivity and specificity for mediastinal pathology.  IMPRESSION: 1. Support apparatus, as above. 2. Chronic changes in the lungs redemonstrated, suggestive of extensive areas of scarring related to prior episodes of infection or inflammation (e.g., ARDS).   Electronically Signed   By: Trudie Reed M.D.   On: 03/12/2014 07:20   Dg Chest Port 1 View  03/11/2014   CLINICAL DATA:  Evaluate endotracheal tube  EXAM: PORTABLE CHEST - 1 VIEW  COMPARISON:  03/10/2014  FINDINGS: Endotracheal tube tip is identified 1.7 cm above the carina. No change left central line. Stable cardiac enlargement exaggerated by limited inspiratory effect. There is fibrotic change in the right middle lobe. There is stable mild diffuse interstitial change.  There is gaseous distention of the stomach. NG tube is unchanged, again seen passing into the stomach.  IMPRESSION: No significant interval change   Electronically Signed    By: Esperanza Heir M.D.   On: 03/11/2014 07:20   Dg Chest Port 1 View  03/10/2014   CLINICAL DATA:  Hypoxia  EXAM: PORTABLE CHEST - 1 VIEW  COMPARISON:  March 09, 2014  FINDINGS: Endotracheal tube tip is 2.0 cm above the carina. Nasogastric tube tip and side port are in the stomach. Central catheter tip is in the superior vena cava. No pneumothorax. There is left base consolidation. There is scarring in the right mid and lower lung zones. There is also right apical pleural thickening which is stable. Heart is mildly enlarged with pulmonary vascularity within normal limits. No adenopathy.  IMPRESSION: Tube and catheter positions as described without pneumothorax. Persistent left lower lobe consolidation. Areas of scarring on the right appear stable.   Electronically Signed   By: Bretta Bang M.D.   On: 03/10/2014 12:08   Dg Chest Port 1 View  03/09/2014   CLINICAL DATA:  Decreased oxygen saturation  EXAM: PORTABLE CHEST - 1 VIEW  COMPARISON:  03/08/2014  FINDINGS: An endotracheal tube is again identified with the tip 10 mm above the carina. It is stable in appearance. A left central venous line is again noted and stable. Nasogastric catheter is coiled within the stomach. Bilateral nipple shadows are seen. Bilateral infiltrates are noted with improved aeration particularly in the left lung base.  IMPRESSION: Mild infiltrates are again identified bilaterally. Improved aeration is noted.  The endotracheal tube still lies low within the trachea and could be withdrawn 1-2 cm. No new focal abnormality is seen.   Electronically Signed   By: Alcide Clever M.D.   On: 03/09/2014 20:22   Dg Chest Port 1 View  03/08/2014   CLINICAL DATA:  Check endotracheal tube position  EXAM: PORTABLE CHEST - 1 VIEW  COMPARISON:  03/07/2014  FINDINGS: Endotracheal tube now lies 1 cm above the carina and is relatively stable from the prior exam. The cardiac shadow is stable. A left jugular central line and nasogastric catheter  are again seen. Bilateral basilar infiltrates are seen as well as apical changes on the right. The overall appearance is stable.  IMPRESSION: Stable bilateral infiltrates.  Tubes and lines as described above. Again the endotracheal tube could be withdrawn 1-2 cm.   Electronically Signed   By: Alcide Clever M.D.   On: 03/08/2014 07:36   Dg Chest Port 1 View  03/07/2014   CLINICAL DATA:  Cough.  Intubation.  EXAM: PORTABLE CHEST -  1 VIEW  COMPARISON:  03/07/2014  FINDINGS: Endotracheal tube tip is localized at the carina. Retraction about 2 cm is recommended. Positioning of the tube is unchanged since previous studies. Enteric tube tip is off the field of view but below the left hemidiaphragm. Left central venous catheter is projected over the mid SVC region. Cardiac enlargement with pulmonary vascular congestion and bilateral parenchymal infiltrates. Findings likely due to edema although pneumonia or ARDS could also have this appearance. Bilateral pleural effusions. No significant change since previous study.  IMPRESSION: Cardiac enlargement with pulmonary vascular congestion and diffuse pulmonary infiltration. Bilateral pleural effusions. Appliances are unchanged in position. The endotracheal tube the tip is again noted at the carina and retraction of about 2 cm is again recommended.   Electronically Signed   By: Burman Nieves M.D.   On: 03/07/2014 22:07   Dg Chest Port 1 View  03/07/2014   CLINICAL DATA:  Intubated patient.  EXAM: PORTABLE CHEST - 1 VIEW  COMPARISON:  03/06/2014  FINDINGS: Endotracheal tube tip lies at the chronic. It appears unchanged from the previous exam.  Hazy bilateral airspace lung opacities, most confluent in the right perihilar and lower lung zone, have increased, some of which is likely due to lower lung volumes on the current exam. There are small bilateral pleural effusions. Scarring and pleural thickening at the apices, greater on the right, is stable. No pneumothorax.  Left  internal jugular central venous line tip is well positioned in the lower superior vena cava. Nasogastric tube tip passes below the diaphragm and below the included field of view.  IMPRESSION: 1. Tip of the endotracheal tube projects at the carina. Consider retracting 1-2 cm for more optimal positioning. 2. Lung aeration appears worsened when compared to the previous day's study although the difference may be due to lower lung volumes on the current exam.   Electronically Signed   By: Amie Portland M.D.   On: 03/07/2014 07:32   Dg Chest Port 1 View  03/06/2014   CLINICAL DATA:  Respiratory failure.  EXAM: PORTABLE CHEST - 1 VIEW  COMPARISON:  03/04/2014.  FINDINGS: Endotracheal tube tip is at the level of carina. Recommend retracting by 2-3 cm to avoid mainstem bronchus intubation with change of patients head position.  Nasogastric tube courses below the diaphragm. Tip is not included on the present exam.  Left central line tip projects at the level of the mid to distal superior vena cava. No gross pneumothorax.  Right apical parenchymal changes similar to prior exam.  Interval improved aeration mid to lower lung zones.  Persistent pulmonary vascular congestion/ pulmonary edema.  Retrocardiac opacity may represent atelectasis with pleural fluid. Infiltrate not excluded. Recommend followup until clearance.  Cardiomegaly.  IMPRESSION: Endotracheal tube tip needs to be repositioned as discussed above.  Interval slight improved aeration mid to lower lung zones. Please see above discussion.  These results were called by telephone at the time of interpretation on 03/06/2014 at 8:05 am to [facility] patient's nurse, who verbally acknowledged these results.   Electronically Signed   By: Bridgett Larsson M.D.   On: 03/06/2014 08:08   Dg Chest Port 1 View  03/04/2014   CLINICAL DATA:  ETT placement  EXAM: PORTABLE CHEST - 1 VIEW  COMPARISON:  03/03/2014  FINDINGS: Endotracheal tube terminates 7 mm above the carina.  Multifocal patchy  opacities in the lungs bilaterally, worrisome for multifocal pneumonia, less likely moderate interstitial edema. Small bilateral pleural effusions. No pneumothorax.  The heart is  top-normal in size.  Left IJ venous catheter terminates at the cavoatrial junction. Enteric tube courses below the diaphragm.  IMPRESSION: Endotracheal tube terminates 7 mm above the carina.  Multifocal patchy opacities in the lungs bilaterally, worrisome for multifocal pneumonia.  Small bilateral pleural effusions.   Electronically Signed   By: Charline Bills M.D.   On: 03/04/2014 07:11   Dg Chest Port 1 View  03/03/2014   CLINICAL DATA:  Endotracheal tube patient, tube placed yesterday, evaluate lungs  EXAM: PORTABLE CHEST - 1 VIEW  COMPARISON:  03/02/2014  FINDINGS: No change in position of endotracheal tube with tip 1 cm above the carina. Left subclavian central line and NG tube unchanged.  Extensive airspace opacity over the right middle and lower lung zones, stable allowing for differences in technique. Diffuse mixed alveolar and interstitial opacity throughout the left lung, most consolidated in the lower lobe, also stable.  IMPRESSION: No change when compared to radiograph performed yesterday.   Electronically Signed   By: Esperanza Heir M.D.   On: 03/03/2014 09:20   Dg Chest Port 1 View  03/02/2014   CLINICAL DATA:  Central line placement  EXAM: PORTABLE CHEST - 1 VIEW  COMPARISON:  03/02/2014  FINDINGS: Endotracheal tube 1 cm above the carina.  Left jugular catheter placement with the tip in the SVC. No pneumothorax. NG tube in the stomach  Diffuse bilateral airspace disease unchanged. Small bilateral effusions unchanged. Question pneumonia versus heart failure  IMPRESSION: Endotracheal tube 1 cm above the carina  Sent factors central line placement  Diffuse bilateral airspace disease and bilateral effusions unchanged.   Electronically Signed   By: Marlan Palau M.D.   On: 03/02/2014 15:45   Dg Chest Portable 1  View  03/02/2014   CLINICAL DATA:  Cardiac and respiratory arrest  EXAM: PORTABLE CHEST - 1 VIEW  COMPARISON:  Portable chest x-ray of March 02, 2014 at 0952 hr.  FINDINGS: The patient has undergone interval intubation of the trachea. The tip of the tube lies approximately 3 0.6 cm above the crotch of the carina. The esophagogastric tube tip projects off the inferior margin of the image. There are external pacing and defibrillation pannus present. The lungs are adequately inflated but exhibit diffusely increased interstitial markings more conspicuous than earlier today. There is no pneumothorax. A small pleural effusion on the right is suspected but is unchanged.  IMPRESSION: The endotracheal and esophagogastric tubes appear to be in appropriate position. There is diffuse interstitial edema more conspicuous than on the earlier study.   Electronically Signed   By: David  [place]   On: 03/02/2014 11:09   Dg Chest Portable 1 View  03/02/2014   CLINICAL DATA:  Short of breath  EXAM: PORTABLE CHEST - 1 VIEW  COMPARISON:  02/27/2014  FINDINGS: Cardiac enlargement with progression of diffuse bilateral airspace disease, possible pneumonia versus infection. Right pleural effusion slightly larger. No left effusion. Bibasilar atelectasis has progressed.  IMPRESSION: Worsening bilateral airspace disease. This may represent pulmonary edema versus infection.   Electronically Signed   By: Marlan Palau M.D.   On: 03/02/2014 10:16   Dg Abd Portable 1v  03/11/2014   CLINICAL DATA:  Abdominal distention.  EXAM: PORTABLE ABDOMEN - 1 VIEW  COMPARISON:  Abdominal radiograph performed 03/10/2014  FINDINGS: There is diffuse dilatation of small and large bowel loops throughout the abdomen and pelvis, and of the stomach, compatible with ileus. This is similar in appearance to the prior study. The patient's enteric tube is  noted ending at the antrum of the stomach. No free intra-abdominal air is seen, though evaluation for free air  is limited on a single supine view.  The visualized osseous structures are within normal limits; the sacroiliac joints are unremarkable in appearance. Mild bibasilar atelectasis is noted.  IMPRESSION: Relatively stable appearance to diffuse ileus. No free intra-abdominal air seen.   Electronically Signed   By: Roanna Raider M.D.   On: 03/11/2014 06:12   Dg Abd Portable 1v  03/10/2014   CLINICAL DATA:  Abdominal distention  EXAM: PORTABLE ABDOMEN - 1 VIEW  COMPARISON:  None.  FINDINGS: There is generalized bowel dilatation. A small amount of air is present in the rectum. No free air is seen on this supine examination. Nasogastric tube tip and side port are in the body of the stomach. There are phleboliths in the pelvis.  IMPRESSION: The bowel gas pattern is most consistent with ileus, although a degree of distal obstruction cannot be entirely excluded. No free air is seen on this supine examination. Nasogastric tube tip and side port in stomach.   Electronically Signed   By: Bretta Bang M.D.   On: 03/10/2014 12:07    ASSESSMENT AND PLAN   Nonischemic cardiomyopathy.  BP is still soft at times. Continue current cardiac meds. No ACEi yet. Okay for discharge home. See cardiology in 2 weeks. Also needs PCP  Signed, Cassell Clement MD

## 2014-03-19 NOTE — Discharge Summary (Signed)
Physician Discharge Summary  [name] [name] RUE:[id number] DOB: 19-Apr-1966 DOA: 03/02/2014  PCP: No primary provider on file.  Admit date: 03/02/2014 Discharge date: 03/19/2014  Recommendations for Outpatient Follow-up:  1. Pt will follow up with community health and wellness clinic in about 2 weeks from discharge. 2. Pt will follow up with cardiology in 3 weeks from discharge. She will be taking coreg on discharge but ACEi not started due to soft BP.  Discharge Diagnoses:  Active Problems:   Respiratory failure   CAP (community acquired pneumonia)   ARDS (adult respiratory distress syndrome)   Encephalopathy acute   Acute respiratory failure with hypoxia    Discharge Condition: stable   Diet recommendation: as tolerated, heart healthy   History of present illness:  48 year old hispanic female (smoker) seen in ED 02/27/14 with shortness of breath, she was treated and subsequently released home. She presented to [facility] ED 03/02/2014 with respiratory distress, had respiratory and cardiac arrest status post 1 round of epi, CPR and subsequently intubated. Initially she was acidotic but has improved with ventilatory support. Patient self extubated 03/07/2014 and required re- ntubation. Finally extubated 03/13/2014. Patient was under PCCM care until and including 03/14/2014 and transferred to [facility] starting 03/15/2014. Hospital course is complicated with seems to be possible ischemic cardiomyopathy. Patient had a 2-D echo on 03/03/2014 which revealed ejection fraction of 30-35% with inferior and posterior/lateral and septal hypokinesis. Patient underwent left heart catheterization 03/16/2014 which showed nonischemic cardiomyopathy.   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 Patient  required 1 round of epinephrine, CPR and was intubated. She self extubated 03/07/2014 but has required reintubation. She was finally extubated 03/13/2014. PCCM transferred care to [facility] starting 03/15/2014. Respiratory status remains stable. She doe snot require oxygen on discharge. Patient was treated adequately for pneumonia. Blood cultures are negative. Strep pneumonia and Legionella are negative.  Patient had Quantiferon Gold positive but negative AFB x 3 so airborne precaution stopped per infectious disease recommendations.  Chest x-ray on 03/15/2014 showed no appreciable change compared with prior study 03/14/2014. There is a scarring and pleural thickening on the right, no frank airspace consolidation and no pneumothorax.  Pt is medically stable for discharge home today.  Active problems:  Acute encephalopathy  History of alcohol abuse. Mental status great this am.  Leukocytosis  Likely secondary to community-acquired pneumonia for which patient received antibiotics appropriately.  WBC count trending down. Acute decompensated systolic and diastolic CHF / ischemic cardiomyopathy  Patient was diuresed aggressively 9/12 through 9/15 per cardiology recommendations , had negative 8.8 L fluid balance. 2-D echo done 03/03/2014 showed ejection fraction 30-35%.  Left heart catheterization done 03/16/2014 and showed nonischemic cardiomyopathy  Continue Coreg 6.25 mg by mouth twice a day. ACEi on hold because blood pressure still soft, 101/63 Colonic ileus  Has required NG tube but has been stable over past 3-4 days tolerating regular diet. No nausea or vomiting. No abdominal distention.  Acute renal failure  Likely secondary to cardiac and respiratory distress, Lasix.  Creatinine is within normal limits. DVT Prophylaxis  Heparin subcutaneous while pt is in hospital   Code Status: Full.  Family Communication: Family not at the bedside this morning.   IV Access:   Peripheral IV Procedures and  diagnostic studies:   Dg Chest Port 1 View 03/15/2014 No appreciable change  from 1 day prior. Scarring and pleural thickening on the right. Mild left base atelectasis. No frank airspace consolidation. Heart prominent but stable. No pneumothorax.  Dg Chest Port 1 View 03/14/2014 Scarring on the right. Left base atelectatic change. Mild cardiac enlargement. No pneumothorax.  CT-PA 03/02/14 No large PE (exam limited), B LAD, B LL predominant infiltrates and consolidation, small B effusions  TTE 03/03/14 Regional LV hypokinesis and dilation, EF 30-35%, dilated LA,  03/10/14 CT head Negative  03/11/14 CT abdomen Consistent with colonic ileus, bilateral effusions atelectasis  Left heart catheterization 03/16/2014 - nonischemic cardiomyopathy Medical Consultants:   Cardiology  Other Consultants:   Physical therapy  Anti-Infectives:   None   Signed:  Manson Passey, MD  [facility] 03/19/2014, 8:20 AM  Pager #: [phone]  Discharge Exam: Filed Vitals:   03/19/14 0515  BP: 101/63  Pulse: 87  Temp: 98.3 F (36.8 C)  Resp: 20   Filed Vitals:   03/18/14 1600 03/18/14 1804 03/18/14 2209 03/19/14 0515  BP: 101/63  Pulse:   91 87  Temp:   98.4 F (36.9 C) 98.3 F (36.8 C)  TempSrc:   Oral Oral  Resp:   20 20  Height:      Weight:    54.159 kg (119 lb 6.4 oz)  SpO2:   99% 100%    General: Pt is alert, follows commands appropriately, not in acute distress Cardiovascular: Regular rate and rhythm, S1/S2 +, no murmurs Respiratory: Clear to auscultation bilaterally, no wheezing, no crackles, no rhonchi Abdominal: Soft, non tender, non distended, bowel sounds +, no guarding Extremities: no edema, no cyanosis, pulses palpable bilaterally DP and PT Neuro: Grossly nonfocal  Discharge Instructions  Discharge Instructions   Call MD for:  difficulty breathing, headache or visual disturbances    Complete by:  As directed      Call MD for:  persistant dizziness or  light-headedness    Complete by:  As directed      Call MD for:  persistant nausea and vomiting    Complete by:  As directed      Call MD for:  severe uncontrolled pain    Complete by:  As directed      Diet - low sodium heart healthy    Complete by:  As directed      Increase activity slowly    Complete by:  As directed             Medication List         aspirin 81 MG EC tablet  Take 1 tablet (81 mg total) by mouth daily.     carvedilol 6.25 MG tablet  Commonly known as:  COREG  Take 1 tablet (6.25 mg total) by mouth 2 (two) times daily with a meal.     feeding supplement (ENSURE COMPLETE) Liqd  Take 237 mLs by mouth 3 (three) times daily with meals.     fexofenadine 60 MG tablet  Commonly known as:  ALLEGRA  Take 1 tablet (60 mg total) by mouth 2 (two) times daily.     pantoprazole 40 MG tablet  Commonly known as:  PROTONIX  Take 1 tablet (40 mg total) by mouth daily.           Follow-up Information   Schedule an appointment as soon as possible for a visit with [facility]    . (Follow up appt after recent hospitalization)    Contact information:  [address] [place] [address] [phone]      Follow up with [facility] Main Office [facility]). Schedule an appointment as soon as possible for a visit in 3 weeks. (Follow up appt after recent hospitalization)    Specialty:  Cardiology   Contact information:   [address] [place] [address] [phone]       The results of significant diagnostics from this hospitalization (including imaging, microbiology, ancillary and laboratory) are listed below for reference.    Significant Diagnostic Studies: Ct Abdomen Pelvis Wo Contrast  03/11/2014   CLINICAL DATA:  Patient on ventilator, abdominal distention  EXAM: CT ABDOMEN AND PELVIS WITHOUT CONTRAST  TECHNIQUE: Multidetector CT imaging of the abdomen and pelvis was performed following the  standard protocol without IV contrast.  COMPARISON:  Abdominal radiograph 03/11/2014  FINDINGS: Oral contrast was given by NG tube. Oral contrast is seen within the stomach. Oral contrast has also progressed into small bowel and to a lesser degree through the colon with a small volume of oral contrast into the rectum. Rectum is distended and fluid-filled, with a diameter of 7.2 cm. There are air-fluid levels throughout the colon. Transverse colon is distended 2 6.7 cm. Cecum measures 7.6 cm. Small bowel is relatively decompressed. There is no evidence of free air.  Bladder is decompressed by Foley catheter. There are no abnormalities involving reproductive organs. There is trace free fluid in the pelvis. Aorta is nondilated.  Given limited evaluation without IV contrast, liver and spleen are normal. Gallbladder appears normal. Pancreas is normal. Adrenal glands and kidneys are normal. NG tube extends into the stomach with tip in the region of the antrum.  There are no acute musculoskeletal findings. There is interstitial prominence in the visualized portions of the lung bases suggesting pulmonary edema. There is cardiac enlargement. There is a small right in a moderate left pleural effusion. Bilateral lower lobe consolidation dependently is likely related to atelectasis.  IMPRESSION: 1. Findings most consistent with colonic ileus 2. Findings consistent with pulmonary edema, pleural effusions, and bilateral dependent atelectasis.   Electronically Signed   By: Esperanza Heir M.D.   On: 03/11/2014 10:19   Dg Chest 2 View  02/27/2014   CLINICAL DATA:  Mid back pain  EXAM: CHEST  2 VIEW  COMPARISON:  None.  FINDINGS: The lungs are markedly abnormal bilaterally. Within the right lung, there is volume loss. Heterogeneous opacities are scattered throughout the lung with relative lucency in the upper lobe. This may represent cavitation. There is pleural thickening at the right apex. There is more confluent opacity in the  posterior upper lobe, likely the right. Underlying mass at the right apex is not excluded.  Date heterogeneous opacities are present at the left base. There is irregular opacity extending into the left apex as well. These densities are predominantly linear.  IMPRESSION: Abnormal lungs as described. An inflammatory process or malignancy are not excluded. Comparison with prior studies would be helpful. If none are available, CT chest is recommended to exclude mass.   Electronically Signed   By: Maryclare Bean M.D.   On: 02/27/2014 12:08   Ct Head Wo Contrast  03/10/2014   CLINICAL DATA:  Encephalopathy, altered mental status.  EXAM: CT HEAD WITHOUT CONTRAST  TECHNIQUE: Contiguous axial images were obtained from the base of the skull through the vertex without intravenous contrast.  COMPARISON:  None.  FINDINGS: The ventricles and sulci are normal. No intraparenchymal hemorrhage, mass effect nor midline  shift. No acute large vascular territory infarcts.  No abnormal extra-axial fluid collections. Basal cisterns are patent.  No skull fracture. The included ocular globes and orbital contents are non-suspicious. The mastoid aircells and included paranasal sinuses are well-aerated. Life support lines seen on the scout view.  IMPRESSION: No acute intracranial process.  Normal noncontrast CT of the head.   Electronically Signed   By: Awilda Metro   On: 03/10/2014 01:01   Ct Chest W Contrast  02/27/2014   CLINICAL DATA:  Shortness of breath. Chest and back pain. Right apical opacity on chest radiograph.  EXAM: CT CHEST WITH CONTRAST  TECHNIQUE: Multidetector CT imaging of the chest was performed during intravenous contrast administration.  CONTRAST:  80mL OMNIPAQUE IOHEXOL 300 MG/ML  SOLN  COMPARISON:  None.  FINDINGS: Mediastinum/Hilar Regions: Mild soft tissue density seen in the right paratracheal region measuring 1.3 cm on image 13. Mild subcarinal lymphadenopathy also seen measuring 1.3 cm. No evidence of lymph  node calcification.  Other Thoracic Lymphadenopathy:  None.  Lungs: Asymmetric pleural-parenchymal opacity in the right lung apex shows associated peripheral bronchiectasis, consistent with scarring. Other areas of scarring are also seen bilaterally involving the right lung greater than left.  Diffuse thickening of pulmonary interlobular septi also demonstrated with bibasilar predominance, and mild interstitial edema cannot be excluded. No evidence of pulmonary consolidation or discrete pulmonary mass. No evidence of central endobronchial obstruction.  Pleura: Tiny bilateral pleural effusions noted, left side greater than right.  Vascular/Cardiac: No thoracic aortic aneurysm or other significant abnormality identified.  Musculoskeletal:  No suspicious bone lesions identified.  Other:  None.  IMPRESSION: Tiny bilateral pleural effusions and thickening of pulmonary interlobular septi in both lung bases. Although this could be due to chronic interstitial lung disease, mild edema or interstitial pneumonitis cannot be excluded. Continued chest radiographic followup is recommended.  Bilateral pleural- parenchymal scarring with associated traction bronchiectasis in right lung apex.  Mild mediastinal lymphadenopathy in the right paratracheal and subcarinal regions, which is nonspecific and may be reactive in etiology. Continued followup by CT is recommended in 3-6 months.   Electronically Signed   By: Myles Rosenthal M.D.   On: 02/27/2014 14:31   Ct Angio Chest Pe W/cm &/or Wo Cm  03/02/2014   CLINICAL DATA:  Respiratory and cardiac arrest. Evaluate for pulmonary embolism  EXAM: CT ANGIOGRAPHY CHEST WITH CONTRAST  TECHNIQUE: Multidetector CT imaging of the chest was performed using the standard protocol during bolus administration of intravenous contrast. Multiplanar CT image reconstructions and MIPs were obtained to evaluate the vascular anatomy.  CONTRAST:  OMNIPAQUE IOHEXOL 350 MG/ML SOLN  COMPARISON:  02/27/2014   FINDINGS: THORACIC INLET/BODY WALL:  Interval tracheal and esophageal intubation. The nasogastric tube continues into the stomach at least. Endotracheal tube ends 1 cm above the carina, deeper than on preceding chest x-ray.  There is symmetric ill-defined fluid within the visceral neck compartment, without definitive cause. This could be reactive to recent intubation or from a generalized edematous state. No fluid collection or subcutaneous emphysema.  MEDIASTINUM:  Cardiomegaly, mainly related to left ventricular enlargement. No pericardial effusion. No significant atherosclerotic change. No acute aortic findings.  Evaluation of the pulmonary arterial tree is limited at multiple levels due to respiratory motion. In general the exam is diagnostic to the level of the proximal most segmental pulmonary arteries. No evidence of pulmonary embolism.  Enlargement of subcarinal and hilar lymph nodes which is symmetric and presumably reactive. Mediastinal node followup has been previously recommended.  LUNG WINDOWS:  Diffuse lung consolidation. There is a dependent predilection, although the consolidation is panlobar. No definitive debris within the central airways. The right upper lobe bronchi are distorted and narrowed, with chronic hyperlucency of the right upper lobe consistent with air trapping. The vessels in this region are likewise attenuated. Given posterior segment calcifications bilaterally, this is likely postinfectious scarring - possibly remote TB. Small bilateral pleural effusions without loculation or abnormal pleural enhancement.  UPPER ABDOMEN:  No acute findings.  OSSEOUS:  No acute findings.  Review of the MIP images confirms the above findings.  IMPRESSION: 1. Multi lobar pneumonia with small pleural effusions. The pattern and history of cardiac arrest implicates aspiration. 2. As permitted by moderate motion degradation, no evidence of pulmonary embolism. 3. Endotracheal tube lower than at time of  placement, now ending 1 cm above the carina. 4. Left ventricular enlargement.  No definite pulmonary edema. 5. Right upper lobe scarring with air trapping, likely from remote infection.   Electronically Signed   By: Tiburcio Pea M.D.   On: 03/02/2014 13:12   Dg Chest Port 1 View  03/15/2014   CLINICAL DATA:  Pneumonia  EXAM: PORTABLE CHEST - 1 VIEW  COMPARISON:  March 14, 2014  FINDINGS: Central catheter tip is in the superior vena cava. No pneumothorax. Areas of scarring and apical pleural thickening on the right remain stable. There is mild atelectatic change in the left base, stable. There is no new opacity. There is no frank consolidation. Heart is mildly enlarged with pulmonary vascularity within normal limits, stable. No appreciable adenopathy.  IMPRESSION: No appreciable change from 1 day prior. Scarring and pleural thickening on the right. Mild left base atelectasis. No frank airspace consolidation. Heart prominent but stable. No pneumothorax.   Electronically Signed   By: Bretta Bang M.D.   On: 03/15/2014 08:04   Dg Chest Port 1 View  03/14/2014   CLINICAL DATA:  Atelectasis  EXAM: PORTABLE CHEST - 1 VIEW  COMPARISON:  March 13, 2014  FINDINGS: Endotracheal and nasogastric tube have been removed. No pneumothorax. Central catheter tip is in the superior vena cava. No pneumothorax. There is asymmetric apical pleural thickening on the right with scattered scarring in the right lung, stable. There is atelectatic change in the left base. There is no appreciable consolidation.  Heart is mildly enlarged with pulmonary vascularity within normal limits. No adenopathy.  IMPRESSION: Scarring on the right. Left base atelectatic change. Mild cardiac enlargement. No pneumothorax.   Electronically Signed   By: Bretta Bang M.D.   On: 03/14/2014 07:08   Dg Chest Port 1 View  03/13/2014   CLINICAL DATA:  Assess endotracheal tube position.  EXAM: PORTABLE CHEST - 1 VIEW  COMPARISON:  Portable  chest x-ray of March 12, 2014  FINDINGS: The lungs are reasonably well inflated. The retrocardiac density on the left is slightly increased. The left hemidiaphragm remains partially obscured. The cardiac silhouette is normal. The pulmonary vascularity is not engorged.  The endotracheal tube tip lies 3.1 cm above the crotch of the carina. The left internal jugular venous catheter tip lies in the proximal SVC. The esophagogastric tube tip projects below the inferior margin of the image.  IMPRESSION: There has not been significant interval change in the appearance of the chest though mild worsening of left lower lobe atelectasis is not excluded. The support tubes and lines are in appropriate position.   Electronically Signed   By: David  [place]   On: 03/13/2014 07:13  Dg Chest Port 1 View  03/12/2014   CLINICAL DATA:  Evaluate endotracheal tube placement.  EXAM: PORTABLE CHEST - 1 VIEW  COMPARISON:  Chest x-ray 03/11/2014.  FINDINGS: An endotracheal tube is in place with tip 3.9 cm above the carina. There is a left-sided internal jugular central venous catheter with tip terminating in the mid superior vena cava. A nasogastric tube is seen extending into the stomach, however, the tip of the nasogastric tube extends below the lower margin of the image. Lung volumes remain slightly low, but have improved compared to the recent prior examination. Again noted is a background of chronic increased interstitial markings throughout the lungs bilaterally with hyperlucency and architectural distortion in the right upper lobe, similar to multiple prior examinations. No definite acute consolidative airspace disease. No pleural effusions. No evidence of pulmonary edema. Mild cardiomegaly is unchanged. The patient is rotated to the right on today's exam, resulting in distortion of the mediastinal contours and reduced diagnostic sensitivity and specificity for mediastinal pathology.  IMPRESSION: 1. Support apparatus, as above.  2. Chronic changes in the lungs redemonstrated, suggestive of extensive areas of scarring related to prior episodes of infection or inflammation (e.g., ARDS).   Electronically Signed   By: Trudie Reed M.D.   On: 03/12/2014 07:20   Dg Chest Port 1 View  03/11/2014   CLINICAL DATA:  Evaluate endotracheal tube  EXAM: PORTABLE CHEST - 1 VIEW  COMPARISON:  03/10/2014  FINDINGS: Endotracheal tube tip is identified 1.7 cm above the carina. No change left central line. Stable cardiac enlargement exaggerated by limited inspiratory effect. There is fibrotic change in the right middle lobe. There is stable mild diffuse interstitial change.  There is gaseous distention of the stomach. NG tube is unchanged, again seen passing into the stomach.  IMPRESSION: No significant interval change   Electronically Signed   By: Esperanza Heir M.D.   On: 03/11/2014 07:20   Dg Chest Port 1 View  03/10/2014   CLINICAL DATA:  Hypoxia  EXAM: PORTABLE CHEST - 1 VIEW  COMPARISON:  March 09, 2014  FINDINGS: Endotracheal tube tip is 2.0 cm above the carina. Nasogastric tube tip and side port are in the stomach. Central catheter tip is in the superior vena cava. No pneumothorax. There is left base consolidation. There is scarring in the right mid and lower lung zones. There is also right apical pleural thickening which is stable. Heart is mildly enlarged with pulmonary vascularity within normal limits. No adenopathy.  IMPRESSION: Tube and catheter positions as described without pneumothorax. Persistent left lower lobe consolidation. Areas of scarring on the right appear stable.   Electronically Signed   By: Bretta Bang M.D.   On: 03/10/2014 12:08   Dg Chest Port 1 View  03/09/2014   CLINICAL DATA:  Decreased oxygen saturation  EXAM: PORTABLE CHEST - 1 VIEW  COMPARISON:  03/08/2014  FINDINGS: An endotracheal tube is again identified with the tip 10 mm above the carina. It is stable in appearance. A left central venous line is  again noted and stable. Nasogastric catheter is coiled within the stomach. Bilateral nipple shadows are seen. Bilateral infiltrates are noted with improved aeration particularly in the left lung base.  IMPRESSION: Mild infiltrates are again identified bilaterally. Improved aeration is noted.  The endotracheal tube still lies low within the trachea and could be withdrawn 1-2 cm. No new focal abnormality is seen.   Electronically Signed   By: Alcide Clever M.D.   On: 03/09/2014  20:22   Dg Chest Port 1 View  03/08/2014   CLINICAL DATA:  Check endotracheal tube position  EXAM: PORTABLE CHEST - 1 VIEW  COMPARISON:  03/07/2014  FINDINGS: Endotracheal tube now lies 1 cm above the carina and is relatively stable from the prior exam. The cardiac shadow is stable. A left jugular central line and nasogastric catheter are again seen. Bilateral basilar infiltrates are seen as well as apical changes on the right. The overall appearance is stable.  IMPRESSION: Stable bilateral infiltrates.  Tubes and lines as described above. Again the endotracheal tube could be withdrawn 1-2 cm.   Electronically Signed   By: Alcide Clever M.D.   On: 03/08/2014 07:36   Dg Chest Port 1 View  03/07/2014   CLINICAL DATA:  Cough.  Intubation.  EXAM: PORTABLE CHEST - 1 VIEW  COMPARISON:  03/07/2014  FINDINGS: Endotracheal tube tip is localized at the carina. Retraction about 2 cm is recommended. Positioning of the tube is unchanged since previous studies. Enteric tube tip is off the field of view but below the left hemidiaphragm. Left central venous catheter is projected over the mid SVC region. Cardiac enlargement with pulmonary vascular congestion and bilateral parenchymal infiltrates. Findings likely due to edema although pneumonia or ARDS could also have this appearance. Bilateral pleural effusions. No significant change since previous study.  IMPRESSION: Cardiac enlargement with pulmonary vascular congestion and diffuse pulmonary infiltration.  Bilateral pleural effusions. Appliances are unchanged in position. The endotracheal tube the tip is again noted at the carina and retraction of about 2 cm is again recommended.   Electronically Signed   By: Burman Nieves M.D.   On: 03/07/2014 22:07   Dg Chest Port 1 View  03/07/2014   CLINICAL DATA:  Intubated patient.  EXAM: PORTABLE CHEST - 1 VIEW  COMPARISON:  03/06/2014  FINDINGS: Endotracheal tube tip lies at the chronic. It appears unchanged from the previous exam.  Hazy bilateral airspace lung opacities, most confluent in the right perihilar and lower lung zone, have increased, some of which is likely due to lower lung volumes on the current exam. There are small bilateral pleural effusions. Scarring and pleural thickening at the apices, greater on the right, is stable. No pneumothorax.  Left internal jugular central venous line tip is well positioned in the lower superior vena cava. Nasogastric tube tip passes below the diaphragm and below the included field of view.  IMPRESSION: 1. Tip of the endotracheal tube projects at the carina. Consider retracting 1-2 cm for more optimal positioning. 2. Lung aeration appears worsened when compared to the previous day's study although the difference may be due to lower lung volumes on the current exam.   Electronically Signed   By: Amie Portland M.D.   On: 03/07/2014 07:32   Dg Chest Port 1 View  03/06/2014   CLINICAL DATA:  Respiratory failure.  EXAM: PORTABLE CHEST - 1 VIEW  COMPARISON:  03/04/2014.  FINDINGS: Endotracheal tube tip is at the level of carina. Recommend retracting by 2-3 cm to avoid mainstem bronchus intubation with change of patients head position.  Nasogastric tube courses below the diaphragm. Tip is not included on the present exam.  Left central line tip projects at the level of the mid to distal superior vena cava. No gross pneumothorax.  Right apical parenchymal changes similar to prior exam.  Interval improved aeration mid to lower lung  zones.  Persistent pulmonary vascular congestion/ pulmonary edema.  Retrocardiac opacity may represent atelectasis with pleural  fluid. Infiltrate not excluded. Recommend followup until clearance.  Cardiomegaly.  IMPRESSION: Endotracheal tube tip needs to be repositioned as discussed above.  Interval slight improved aeration mid to lower lung zones. Please see above discussion.  These results were called by telephone at the time of interpretation on 03/06/2014 at 8:05 am to [facility] patient's nurse, who verbally acknowledged these results.   Electronically Signed   By: Bridgett Larsson M.D.   On: 03/06/2014 08:08   Dg Chest Port 1 View  03/04/2014   CLINICAL DATA:  ETT placement  EXAM: PORTABLE CHEST - 1 VIEW  COMPARISON:  03/03/2014  FINDINGS: Endotracheal tube terminates 7 mm above the carina.  Multifocal patchy opacities in the lungs bilaterally, worrisome for multifocal pneumonia, less likely moderate interstitial edema. Small bilateral pleural effusions. No pneumothorax.  The heart is top-normal in size.  Left IJ venous catheter terminates at the cavoatrial junction. Enteric tube courses below the diaphragm.  IMPRESSION: Endotracheal tube terminates 7 mm above the carina.  Multifocal patchy opacities in the lungs bilaterally, worrisome for multifocal pneumonia.  Small bilateral pleural effusions.   Electronically Signed   By: Charline Bills M.D.   On: 03/04/2014 07:11   Dg Chest Port 1 View  03/03/2014   CLINICAL DATA:  Endotracheal tube patient, tube placed yesterday, evaluate lungs  EXAM: PORTABLE CHEST - 1 VIEW  COMPARISON:  03/02/2014  FINDINGS: No change in position of endotracheal tube with tip 1 cm above the carina. Left subclavian central line and NG tube unchanged.  Extensive airspace opacity over the right middle and lower lung zones, stable allowing for differences in technique. Diffuse mixed alveolar and interstitial opacity throughout the left lung, most consolidated in the lower lobe, also stable.   IMPRESSION: No change when compared to radiograph performed yesterday.   Electronically Signed   By: Esperanza Heir M.D.   On: 03/03/2014 09:20   Dg Chest Port 1 View  03/02/2014   CLINICAL DATA:  Central line placement  EXAM: PORTABLE CHEST - 1 VIEW  COMPARISON:  03/02/2014  FINDINGS: Endotracheal tube 1 cm above the carina.  Left jugular catheter placement with the tip in the SVC. No pneumothorax. NG tube in the stomach  Diffuse bilateral airspace disease unchanged. Small bilateral effusions unchanged. Question pneumonia versus heart failure  IMPRESSION: Endotracheal tube 1 cm above the carina  Sent factors central line placement  Diffuse bilateral airspace disease and bilateral effusions unchanged.   Electronically Signed   By: Marlan Palau M.D.   On: 03/02/2014 15:45   Dg Chest Portable 1 View  03/02/2014   CLINICAL DATA:  Cardiac and respiratory arrest  EXAM: PORTABLE CHEST - 1 VIEW  COMPARISON:  Portable chest x-ray of March 02, 2014 at 0952 hr.  FINDINGS: The patient has undergone interval intubation of the trachea. The tip of the tube lies approximately 3 0.6 cm above the crotch of the carina. The esophagogastric tube tip projects off the inferior margin of the image. There are external pacing and defibrillation pannus present. The lungs are adequately inflated but exhibit diffusely increased interstitial markings more conspicuous than earlier today. There is no pneumothorax. A small pleural effusion on the right is suspected but is unchanged.  IMPRESSION: The endotracheal and esophagogastric tubes appear to be in appropriate position. There is diffuse interstitial edema more conspicuous than on the earlier study.   Electronically Signed   By: David  [place]   On: 03/02/2014 11:09   Dg Chest Portable 1 View  03/02/2014  CLINICAL DATA:  Short of breath  EXAM: PORTABLE CHEST - 1 VIEW  COMPARISON:  02/27/2014  FINDINGS: Cardiac enlargement with progression of diffuse bilateral airspace disease,  possible pneumonia versus infection. Right pleural effusion slightly larger. No left effusion. Bibasilar atelectasis has progressed.  IMPRESSION: Worsening bilateral airspace disease. This may represent pulmonary edema versus infection.   Electronically Signed   By: Marlan Palau M.D.   On: 03/02/2014 10:16   Dg Abd Portable 1v  03/11/2014   CLINICAL DATA:  Abdominal distention.  EXAM: PORTABLE ABDOMEN - 1 VIEW  COMPARISON:  Abdominal radiograph performed 03/10/2014  FINDINGS: There is diffuse dilatation of small and large bowel loops throughout the abdomen and pelvis, and of the stomach, compatible with ileus. This is similar in appearance to the prior study. The patient's enteric tube is noted ending at the antrum of the stomach. No free intra-abdominal air is seen, though evaluation for free air is limited on a single supine view.  The visualized osseous structures are within normal limits; the sacroiliac joints are unremarkable in appearance. Mild bibasilar atelectasis is noted.  IMPRESSION: Relatively stable appearance to diffuse ileus. No free intra-abdominal air seen.   Electronically Signed   By: Roanna Raider M.D.   On: 03/11/2014 06:12   Dg Abd Portable 1v  03/10/2014   CLINICAL DATA:  Abdominal distention  EXAM: PORTABLE ABDOMEN - 1 VIEW  COMPARISON:  None.  FINDINGS: There is generalized bowel dilatation. A small amount of air is present in the rectum. No free air is seen on this supine examination. Nasogastric tube tip and side port are in the body of the stomach. There are phleboliths in the pelvis.  IMPRESSION: The bowel gas pattern is most consistent with ileus, although a degree of distal obstruction cannot be entirely excluded. No free air is seen on this supine examination. Nasogastric tube tip and side port in stomach.   Electronically Signed   By: Bretta Bang M.D.   On: 03/10/2014 12:07    Microbiology: No results found for this or any previous visit (from the past 240  hour(s)).   Labs: Basic Metabolic Panel:  Recent Labs Lab 03/13/14 0600 03/14/14 0530 03/15/14 0530 03/16/14 1825 03/17/14 0915 03/18/14 0849  NA 139 137 134*  --  133* 134*  K 3.7 4.3 4.0  --  3.5* 4.0  CL 93* 95* 92*  --  97 98  CO2 28 24 23   --  22 20  GLUCOSE 87 92 108*  --  85 93  BUN 28* 28* 26*  --  18 15  CREATININE 1.12* 0.94 0.90 0.83 0.85 0.83  CALCIUM 10.2 10.1 10.3  --  9.2 9.4  MG 2.6* 2.4 2.3  --   --   --   PHOS 5.3* 4.6 4.0  --   --   --    Liver Function Tests: No results found for this basename: AST, ALT, ALKPHOS, BILITOT, PROT, ALBUMIN,  in the last 168 hours No results found for this basename: LIPASE, AMYLASE,  in the last 168 hours No results found for this basename: AMMONIA,  in the last 168 hours CBC:  Recent Labs Lab 03/13/14 0600 03/14/14 0530 03/15/14 0530 03/16/14 1825 03/17/14 0915  WBC 16.5* 16.5* 16.2* 14.9* 13.8*  NEUTROABS 12.7*  --   --   --   --   HGB 10.2* 10.2* 10.8* 10.3* 10.0*  HCT 30.9* 30.9* 32.7* 30.5* 30.0*  MCV 89.8 89.6 91.1 88.9 88.5  PLT 588*  648* 637* 608* 623*   Cardiac Enzymes: No results found for this basename: CKTOTAL, CKMB, CKMBINDEX, TROPONINI,  in the last 168 hours BNP: BNP (last 3 results)  Recent Labs  03/02/14 1002  PROBNP 1799.0*   CBG:  Recent Labs Lab 03/18/14 0751 03/18/14 1116 03/18/14 1654 03/18/14 2207 03/19/14 0745  GLUCAP 88 150* 130* 164* 106*    Time coordinating discharge: Over 30 minutes

## 2014-03-27 ENCOUNTER — Encounter: Payer: Self-pay | Admitting: Family Medicine

## 2014-03-27 ENCOUNTER — Ambulatory Visit: Payer: Self-pay | Attending: Family Medicine | Admitting: Family Medicine

## 2014-03-27 VITALS — BP 96/61 | HR 85 | Temp 98.7°F | Resp 18 | Ht 66.0 in | Wt 122.0 lb

## 2014-03-27 DIAGNOSIS — G4700 Insomnia, unspecified: Secondary | ICD-10-CM | POA: Insufficient documentation

## 2014-03-27 DIAGNOSIS — I509 Heart failure, unspecified: Secondary | ICD-10-CM | POA: Insufficient documentation

## 2014-03-27 DIAGNOSIS — I5020 Unspecified systolic (congestive) heart failure: Secondary | ICD-10-CM | POA: Insufficient documentation

## 2014-03-27 DIAGNOSIS — Z299 Encounter for prophylactic measures, unspecified: Secondary | ICD-10-CM

## 2014-03-27 DIAGNOSIS — Z8701 Personal history of pneumonia (recurrent): Secondary | ICD-10-CM | POA: Insufficient documentation

## 2014-03-27 DIAGNOSIS — R0989 Other specified symptoms and signs involving the circulatory and respiratory systems: Secondary | ICD-10-CM | POA: Insufficient documentation

## 2014-03-27 DIAGNOSIS — Z23 Encounter for immunization: Secondary | ICD-10-CM | POA: Insufficient documentation

## 2014-03-27 DIAGNOSIS — R0609 Other forms of dyspnea: Secondary | ICD-10-CM | POA: Insufficient documentation

## 2014-03-27 DIAGNOSIS — Z7982 Long term (current) use of aspirin: Secondary | ICD-10-CM | POA: Insufficient documentation

## 2014-03-27 DIAGNOSIS — Z79899 Other long term (current) drug therapy: Secondary | ICD-10-CM | POA: Insufficient documentation

## 2014-03-27 DIAGNOSIS — F17200 Nicotine dependence, unspecified, uncomplicated: Secondary | ICD-10-CM | POA: Insufficient documentation

## 2014-03-27 MED ORDER — CARVEDILOL 6.25 MG PO TABS: 6.2500 mg | ORAL_TABLET | Freq: Two times a day (BID) | ORAL | Status: AC

## 2014-03-27 MED ORDER — ZOLPIDEM TARTRATE 5 MG PO TABS: 5.0000 mg | ORAL_TABLET | Freq: Every evening | ORAL | Status: AC | PRN

## 2014-03-27 NOTE — Progress Notes (Signed)
   Subjective:    Patient ID: [name] [name], female    DOB: September 29, 1965, 48 y.o.   MRN: [id number] CC: establish care, HFU for respiratory failure in the setting of CAP and rEFCHF  HPI 48 year old female presents to establish care discussed the following:  #1 hospital followup for acute respiratory failure: Patient was hospitalized from 03/29/2014 to 03/19/2014 with acute respiratory failure attributed to the community-acquired pneumonia and congestive heart failure. Please see discharge summary for details hospital course. Patient is discharged to home with low-dose Coreg and ACE inhibitor was not started due to soft blood pressures. Since discharge she reports shortness of breath with exertion, insomnia, anorexia. She is able to lay flat without exacerbating shortness of breath. She denies chest pain. She denies syncope.  Patient does report to me that she plans to move back to [place] to be closer to her support system at end of this week or early next week.  Soc Hx: current smoker  Review of Systems As per HPI     Objective:   Physical Exam BP 96/61  Pulse 85  Temp(Src) 98.7 F (37.1 C) (Oral)  Resp 18  Ht 5\' 6"  (1.676 m)  Wt 122 lb (55.339 kg)  BMI 19.70 kg/m2  SpO2 99% Wt Readings from Last 3 Encounters:  03/27/14 122 lb (55.339 kg)  03/19/14 119 lb 6.4 oz (54.159 kg)  03/19/14 119 lb 6.4 oz (54.159 kg)   BP Readings from Last 3 Encounters:  03/27/14 96/61  03/19/14 101/63  03/19/14 101/63  General appearance: alert, cooperative and no distress, thin adult female  Lungs: clear to auscultation bilaterally Heart: regular rate and rhythm, S1, S2 normal, no murmur, click, rub or gallop Extremities: extremities normal, atraumatic, no cyanosis or edema       Assessment & Plan:

## 2014-03-27 NOTE — Progress Notes (Signed)
Establish Care HFU Pt Stated still SOB pain lt rib area, low appetite

## 2014-03-27 NOTE — Assessment & Plan Note (Addendum)
A: HFU for CAP and CHF. Persistently soft BPs. No sign of acute exacerbation or recurrent pneumonia. Continues to smoke. Moving to [place] with in the week.  P:  Continue coreg Wait to start ACE inhibitor until BPs improve Smoking cessation

## 2014-03-27 NOTE — Patient Instructions (Signed)
Ms. [name] [name], [name] [name] por venido hoy. Mucho gusto.   ambien as needed for sleep Continue coreg  See your primary doctor and [place] for a cardiology referral.  Dr. Armen Pickup

## 2014-03-27 NOTE — Assessment & Plan Note (Signed)
A: since acute illness. No history of long term insomnia P: ambien prn

## 2014-04-14 LAB — AFB CULTURE WITH SMEAR (NOT AT ARMC)
Acid Fast Smear: NONE SEEN
Special Requests: NORMAL

## 2014-04-16 LAB — AFB CULTURE WITH SMEAR (NOT AT ARMC)
ACID FAST SMEAR: NONE SEEN
Acid Fast Smear: NONE SEEN

## 2014-06-08 ENCOUNTER — Encounter (HOSPITAL_COMMUNITY): Payer: Self-pay | Admitting: Cardiology

## 2014-12-27 IMAGING — DX DG ABD PORTABLE 1V
1 series · 1 of 1 positions shown · non-contrast
Comparison: Abdominal radiograph performed 03/10/2014

CLINICAL DATA: Abdominal distention.

EXAM:
PORTABLE ABDOMEN - 1 VIEW

[abdomen kub]
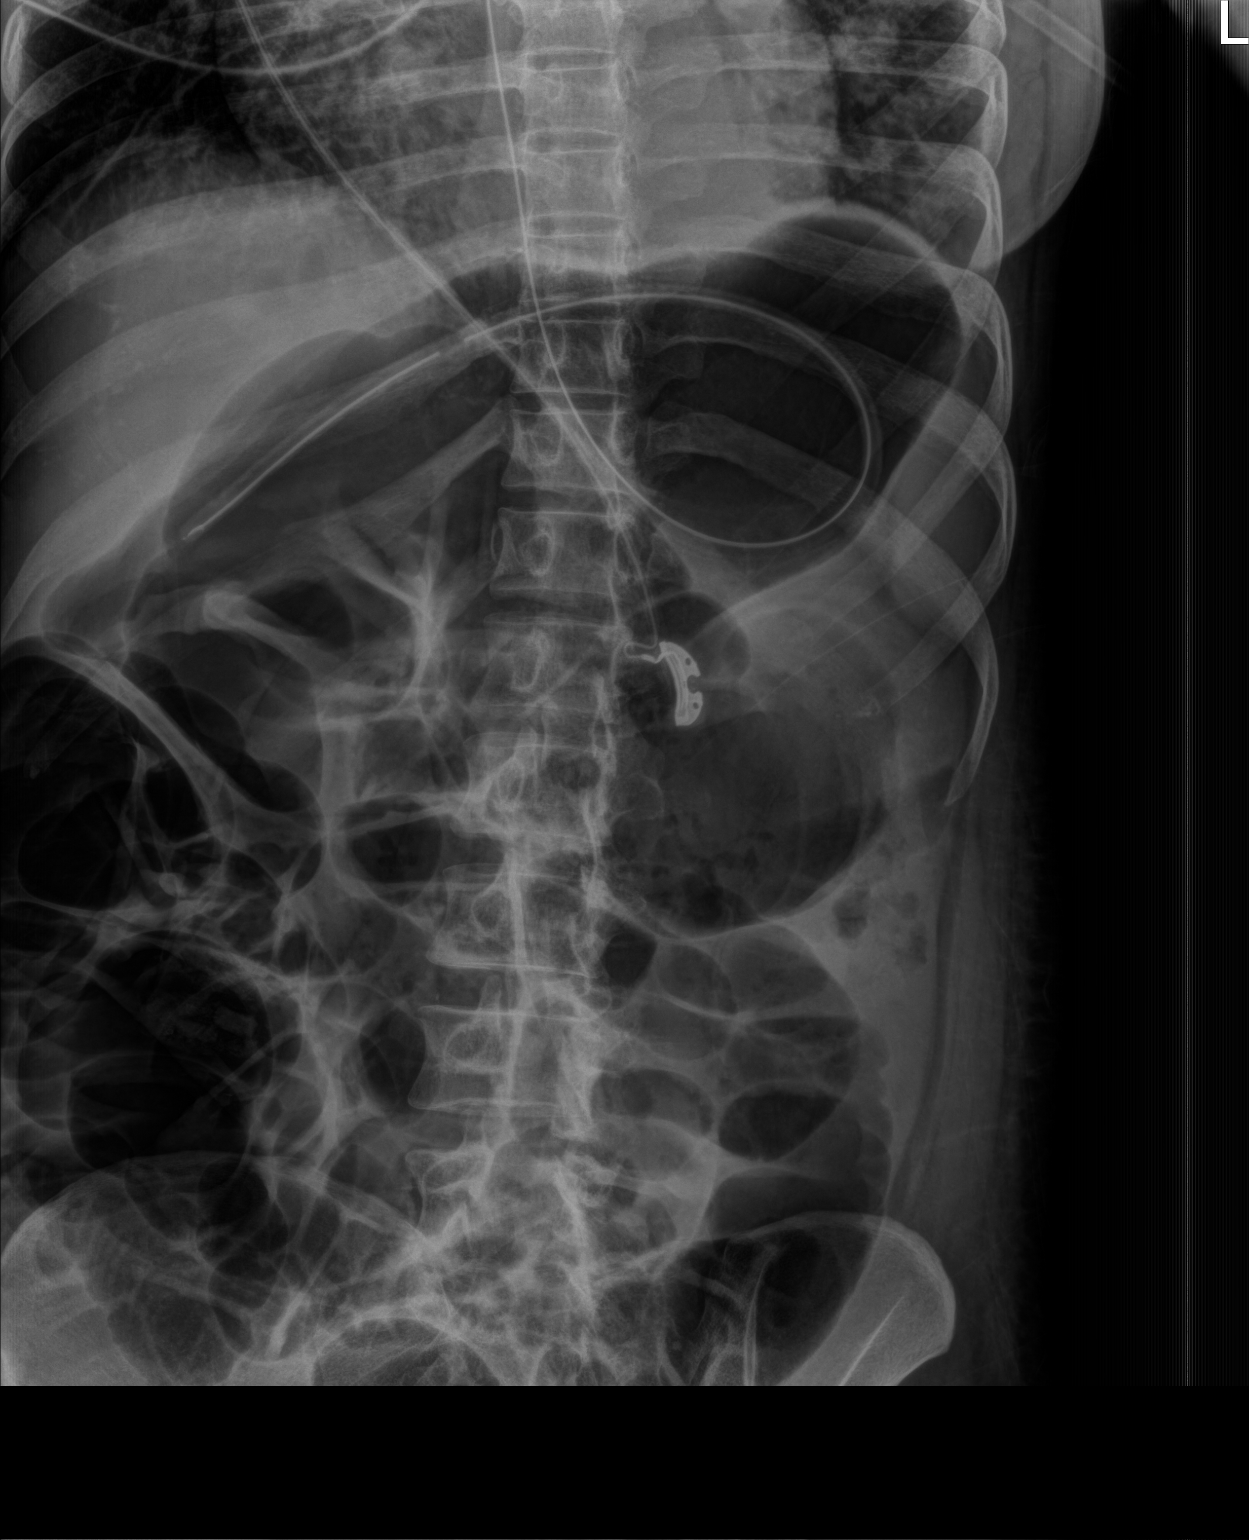

[1 of 1 positions shown; findings below may reference images not displayed]

FINDINGS: There is diffuse dilatation of small and large bowel loops
throughout the abdomen and pelvis, and of the stomach, compatible
with ileus. This is similar in appearance to the prior study. The
patient's enteric tube is noted ending at the antrum of the stomach.
No free intra-abdominal air is seen, though evaluation for free air
is limited on a single supine view.

The visualized osseous structures are within normal limits; the
sacroiliac joints are unremarkable in appearance. Mild bibasilar
atelectasis is noted.
IMPRESSION: Relatively stable appearance to diffuse ileus. No free
intra-abdominal air seen.

## 2014-12-30 IMAGING — DX DG CHEST 1V PORT
1 series · 1 of 1 positions shown · non-contrast
Comparison: March 13, 2014

CLINICAL DATA: Atelectasis

EXAM:
PORTABLE CHEST - 1 VIEW

[chest ap]
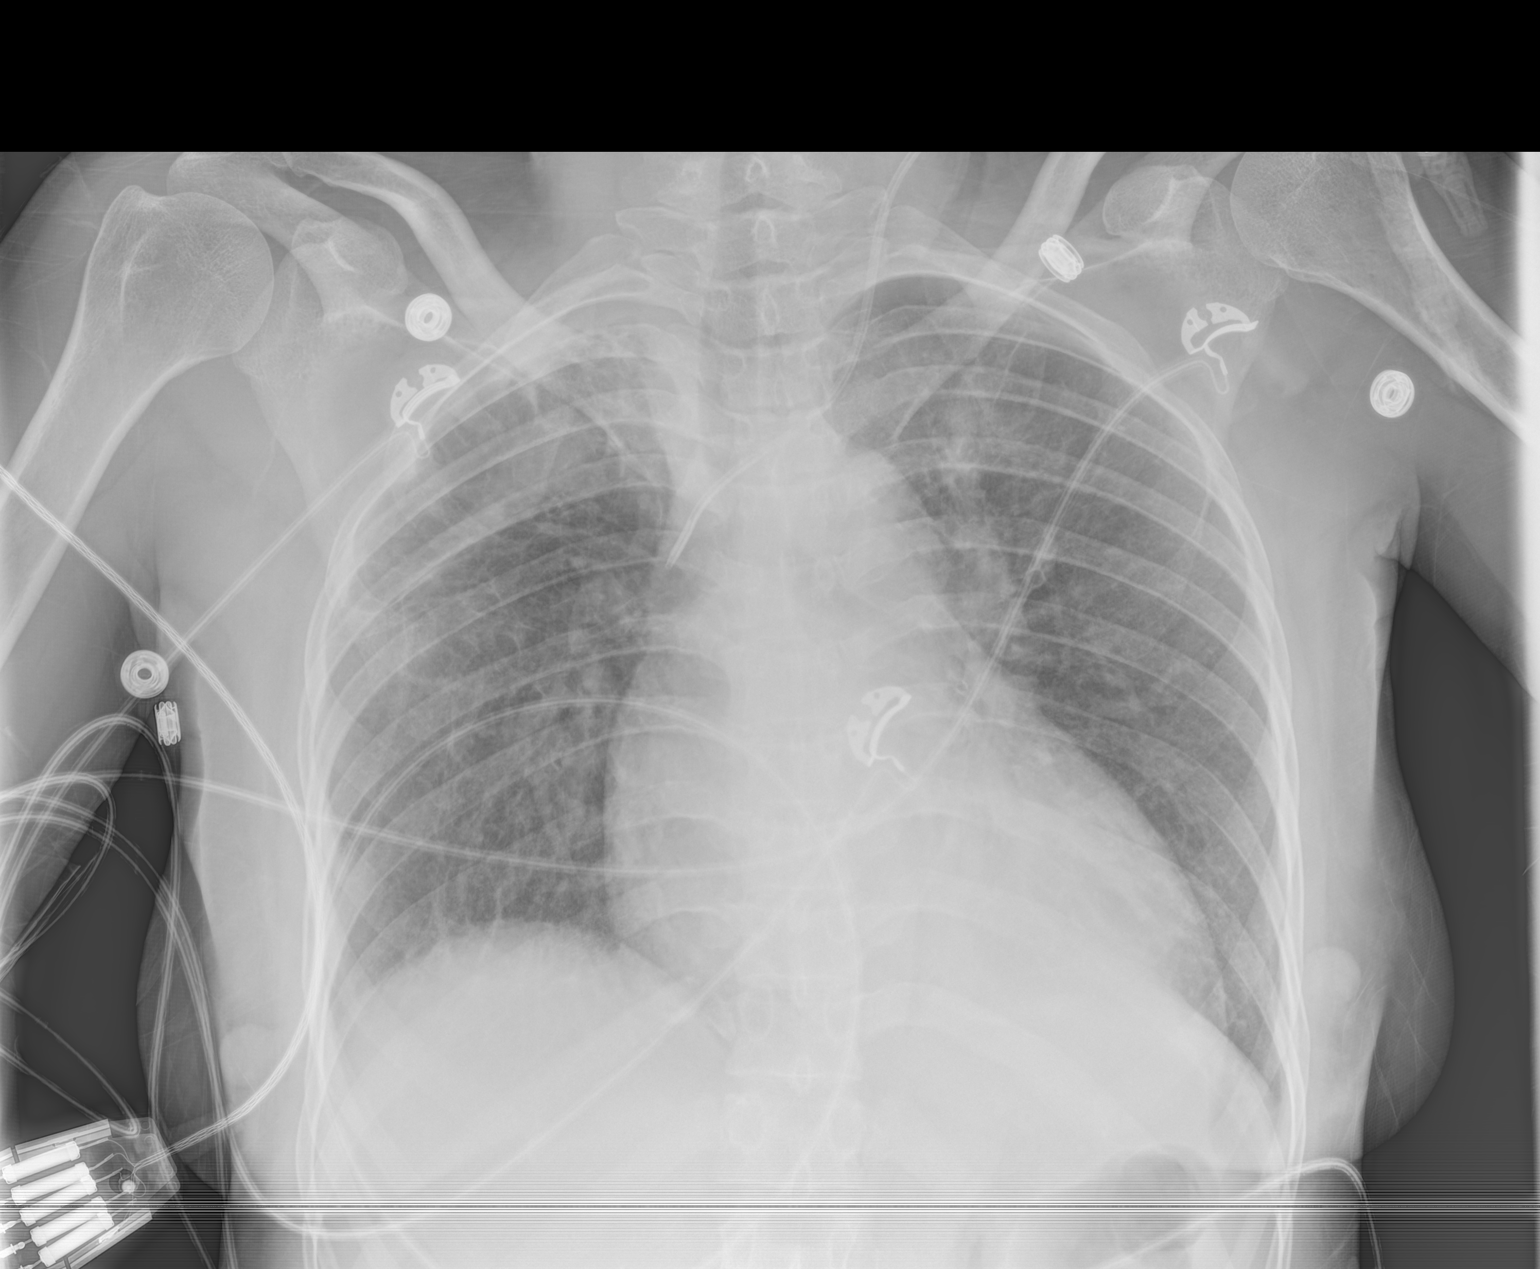

[1 of 1 positions shown; findings below may reference images not displayed]

FINDINGS: Endotracheal and nasogastric tube have been removed. No
pneumothorax. Central catheter tip is in the superior vena cava. No
pneumothorax. There is asymmetric apical pleural thickening on the
right with scattered scarring in the right lung, stable. There is
atelectatic change in the left base. There is no appreciable
consolidation.

Heart is mildly enlarged with pulmonary vascularity within normal
limits. No adenopathy.
IMPRESSION: Scarring on the right. Left base atelectatic change. Mild cardiac
enlargement. No pneumothorax.
